# Patient Record
Sex: Female | Born: 1935 | Race: White | Hispanic: No | Smoking: Former smoker
Health system: Southern US, Community
[De-identification: ages and names within clinical notes are randomized; demographics above are authoritative.]

## PROBLEM LIST (undated history)

## (undated) DIAGNOSIS — I82409 Acute embolism and thrombosis of unspecified deep veins of unspecified lower extremity: Secondary | ICD-10-CM

## (undated) DIAGNOSIS — I2699 Other pulmonary embolism without acute cor pulmonale: Secondary | ICD-10-CM

## (undated) DIAGNOSIS — I10 Essential (primary) hypertension: Secondary | ICD-10-CM

## (undated) DIAGNOSIS — K219 Gastro-esophageal reflux disease without esophagitis: Secondary | ICD-10-CM

## (undated) DIAGNOSIS — J449 Chronic obstructive pulmonary disease, unspecified: Secondary | ICD-10-CM

## (undated) DIAGNOSIS — E785 Hyperlipidemia, unspecified: Secondary | ICD-10-CM

## (undated) DIAGNOSIS — F0390 Unspecified dementia without behavioral disturbance: Secondary | ICD-10-CM

## (undated) DIAGNOSIS — E119 Type 2 diabetes mellitus without complications: Secondary | ICD-10-CM

## (undated) HISTORY — DX: Unspecified dementia without behavioral disturbance: F03.90

## (undated) HISTORY — PX: CHOLECYSTECTOMY: SHX55

## (undated) HISTORY — PX: ABDOMINAL HYSTERECTOMY: SHX81

---

## 2004-10-22 ENCOUNTER — Ambulatory Visit: Payer: Self-pay | Admitting: Family Medicine

## 2004-10-27 ENCOUNTER — Ambulatory Visit: Payer: Self-pay | Admitting: Family Medicine

## 2004-12-24 ENCOUNTER — Ambulatory Visit: Payer: Self-pay

## 2005-08-05 ENCOUNTER — Ambulatory Visit: Payer: Self-pay | Admitting: Family Medicine

## 2006-06-05 ENCOUNTER — Ambulatory Visit: Payer: Self-pay | Admitting: Family Medicine

## 2007-06-05 ENCOUNTER — Ambulatory Visit (HOSPITAL_COMMUNITY): Admission: RE | Admit: 2007-06-05 | Discharge: 2007-06-05 | Payer: Self-pay | Admitting: Ophthalmology

## 2007-06-19 ENCOUNTER — Ambulatory Visit (HOSPITAL_COMMUNITY): Admission: RE | Admit: 2007-06-19 | Discharge: 2007-06-19 | Payer: Self-pay | Admitting: Ophthalmology

## 2011-06-02 LAB — BASIC METABOLIC PANEL
BUN: 12
Chloride: 102
Potassium: 4.2

## 2011-06-02 LAB — HEMOGLOBIN AND HEMATOCRIT, BLOOD: Hemoglobin: 12.2

## 2014-09-03 DIAGNOSIS — I10 Essential (primary) hypertension: Secondary | ICD-10-CM | POA: Diagnosis not present

## 2014-09-03 DIAGNOSIS — E119 Type 2 diabetes mellitus without complications: Secondary | ICD-10-CM | POA: Diagnosis not present

## 2014-09-03 DIAGNOSIS — E780 Pure hypercholesterolemia: Secondary | ICD-10-CM | POA: Diagnosis not present

## 2014-09-03 DIAGNOSIS — Z7901 Long term (current) use of anticoagulants: Secondary | ICD-10-CM | POA: Diagnosis not present

## 2014-09-03 DIAGNOSIS — K219 Gastro-esophageal reflux disease without esophagitis: Secondary | ICD-10-CM | POA: Diagnosis not present

## 2014-10-09 DIAGNOSIS — Z7901 Long term (current) use of anticoagulants: Secondary | ICD-10-CM | POA: Diagnosis not present

## 2014-11-06 DIAGNOSIS — Z7901 Long term (current) use of anticoagulants: Secondary | ICD-10-CM | POA: Diagnosis not present

## 2014-12-04 DIAGNOSIS — I10 Essential (primary) hypertension: Secondary | ICD-10-CM | POA: Diagnosis not present

## 2014-12-04 DIAGNOSIS — E119 Type 2 diabetes mellitus without complications: Secondary | ICD-10-CM | POA: Diagnosis not present

## 2014-12-04 DIAGNOSIS — E780 Pure hypercholesterolemia: Secondary | ICD-10-CM | POA: Diagnosis not present

## 2014-12-04 DIAGNOSIS — K219 Gastro-esophageal reflux disease without esophagitis: Secondary | ICD-10-CM | POA: Diagnosis not present

## 2014-12-10 DIAGNOSIS — Z7901 Long term (current) use of anticoagulants: Secondary | ICD-10-CM | POA: Diagnosis not present

## 2014-12-10 DIAGNOSIS — E780 Pure hypercholesterolemia: Secondary | ICD-10-CM | POA: Diagnosis not present

## 2014-12-10 DIAGNOSIS — E119 Type 2 diabetes mellitus without complications: Secondary | ICD-10-CM | POA: Diagnosis not present

## 2014-12-25 DIAGNOSIS — Z7901 Long term (current) use of anticoagulants: Secondary | ICD-10-CM | POA: Diagnosis not present

## 2015-01-29 DIAGNOSIS — Z7901 Long term (current) use of anticoagulants: Secondary | ICD-10-CM | POA: Diagnosis not present

## 2015-02-26 DIAGNOSIS — Z7901 Long term (current) use of anticoagulants: Secondary | ICD-10-CM | POA: Diagnosis not present

## 2015-02-26 DIAGNOSIS — E119 Type 2 diabetes mellitus without complications: Secondary | ICD-10-CM | POA: Diagnosis not present

## 2015-02-26 DIAGNOSIS — E784 Other hyperlipidemia: Secondary | ICD-10-CM | POA: Diagnosis not present

## 2015-03-04 DIAGNOSIS — K219 Gastro-esophageal reflux disease without esophagitis: Secondary | ICD-10-CM | POA: Diagnosis not present

## 2015-03-04 DIAGNOSIS — E780 Pure hypercholesterolemia: Secondary | ICD-10-CM | POA: Diagnosis not present

## 2015-03-04 DIAGNOSIS — E119 Type 2 diabetes mellitus without complications: Secondary | ICD-10-CM | POA: Diagnosis not present

## 2015-03-04 DIAGNOSIS — I10 Essential (primary) hypertension: Secondary | ICD-10-CM | POA: Diagnosis not present

## 2015-04-02 DIAGNOSIS — Z7901 Long term (current) use of anticoagulants: Secondary | ICD-10-CM | POA: Diagnosis not present

## 2015-05-07 DIAGNOSIS — Z7901 Long term (current) use of anticoagulants: Secondary | ICD-10-CM | POA: Diagnosis not present

## 2015-06-03 DIAGNOSIS — Z7901 Long term (current) use of anticoagulants: Secondary | ICD-10-CM | POA: Diagnosis not present

## 2015-06-03 DIAGNOSIS — E7800 Pure hypercholesterolemia, unspecified: Secondary | ICD-10-CM | POA: Diagnosis not present

## 2015-06-03 DIAGNOSIS — E119 Type 2 diabetes mellitus without complications: Secondary | ICD-10-CM | POA: Diagnosis not present

## 2015-06-10 DIAGNOSIS — E7800 Pure hypercholesterolemia, unspecified: Secondary | ICD-10-CM | POA: Diagnosis not present

## 2015-06-10 DIAGNOSIS — K219 Gastro-esophageal reflux disease without esophagitis: Secondary | ICD-10-CM | POA: Diagnosis not present

## 2015-06-10 DIAGNOSIS — I10 Essential (primary) hypertension: Secondary | ICD-10-CM | POA: Diagnosis not present

## 2015-06-10 DIAGNOSIS — E1165 Type 2 diabetes mellitus with hyperglycemia: Secondary | ICD-10-CM | POA: Diagnosis not present

## 2015-06-10 DIAGNOSIS — Z7901 Long term (current) use of anticoagulants: Secondary | ICD-10-CM | POA: Diagnosis not present

## 2015-07-15 DIAGNOSIS — Z7901 Long term (current) use of anticoagulants: Secondary | ICD-10-CM | POA: Diagnosis not present

## 2015-07-28 DIAGNOSIS — Z7901 Long term (current) use of anticoagulants: Secondary | ICD-10-CM | POA: Diagnosis not present

## 2015-08-07 DIAGNOSIS — J0110 Acute frontal sinusitis, unspecified: Secondary | ICD-10-CM | POA: Diagnosis not present

## 2015-08-07 DIAGNOSIS — J449 Chronic obstructive pulmonary disease, unspecified: Secondary | ICD-10-CM | POA: Diagnosis not present

## 2015-08-25 DIAGNOSIS — H903 Sensorineural hearing loss, bilateral: Secondary | ICD-10-CM | POA: Diagnosis not present

## 2015-08-25 DIAGNOSIS — H9319 Tinnitus, unspecified ear: Secondary | ICD-10-CM | POA: Diagnosis not present

## 2015-09-03 DIAGNOSIS — E7800 Pure hypercholesterolemia, unspecified: Secondary | ICD-10-CM | POA: Diagnosis not present

## 2015-09-03 DIAGNOSIS — E1165 Type 2 diabetes mellitus with hyperglycemia: Secondary | ICD-10-CM | POA: Diagnosis not present

## 2015-09-03 DIAGNOSIS — Z7901 Long term (current) use of anticoagulants: Secondary | ICD-10-CM | POA: Diagnosis not present

## 2015-09-10 DIAGNOSIS — J449 Chronic obstructive pulmonary disease, unspecified: Secondary | ICD-10-CM | POA: Diagnosis not present

## 2015-09-10 DIAGNOSIS — E1165 Type 2 diabetes mellitus with hyperglycemia: Secondary | ICD-10-CM | POA: Diagnosis not present

## 2015-09-10 DIAGNOSIS — K219 Gastro-esophageal reflux disease without esophagitis: Secondary | ICD-10-CM | POA: Diagnosis not present

## 2015-09-10 DIAGNOSIS — E7800 Pure hypercholesterolemia, unspecified: Secondary | ICD-10-CM | POA: Diagnosis not present

## 2015-09-10 DIAGNOSIS — I10 Essential (primary) hypertension: Secondary | ICD-10-CM | POA: Diagnosis not present

## 2015-09-24 DIAGNOSIS — Z7901 Long term (current) use of anticoagulants: Secondary | ICD-10-CM | POA: Diagnosis not present

## 2015-10-23 DIAGNOSIS — Z7901 Long term (current) use of anticoagulants: Secondary | ICD-10-CM | POA: Diagnosis not present

## 2015-11-12 DIAGNOSIS — Z7901 Long term (current) use of anticoagulants: Secondary | ICD-10-CM | POA: Diagnosis not present

## 2015-12-02 DIAGNOSIS — E7800 Pure hypercholesterolemia, unspecified: Secondary | ICD-10-CM | POA: Diagnosis not present

## 2015-12-02 DIAGNOSIS — Z7901 Long term (current) use of anticoagulants: Secondary | ICD-10-CM | POA: Diagnosis not present

## 2015-12-02 DIAGNOSIS — E1165 Type 2 diabetes mellitus with hyperglycemia: Secondary | ICD-10-CM | POA: Diagnosis not present

## 2015-12-09 DIAGNOSIS — E1165 Type 2 diabetes mellitus with hyperglycemia: Secondary | ICD-10-CM | POA: Diagnosis not present

## 2015-12-09 DIAGNOSIS — E7800 Pure hypercholesterolemia, unspecified: Secondary | ICD-10-CM | POA: Diagnosis not present

## 2015-12-09 DIAGNOSIS — Z7901 Long term (current) use of anticoagulants: Secondary | ICD-10-CM | POA: Diagnosis not present

## 2015-12-09 DIAGNOSIS — F5101 Primary insomnia: Secondary | ICD-10-CM | POA: Diagnosis not present

## 2015-12-09 DIAGNOSIS — K219 Gastro-esophageal reflux disease without esophagitis: Secondary | ICD-10-CM | POA: Diagnosis not present

## 2015-12-09 DIAGNOSIS — J449 Chronic obstructive pulmonary disease, unspecified: Secondary | ICD-10-CM | POA: Diagnosis not present

## 2015-12-09 DIAGNOSIS — I10 Essential (primary) hypertension: Secondary | ICD-10-CM | POA: Diagnosis not present

## 2015-12-23 DIAGNOSIS — Z7901 Long term (current) use of anticoagulants: Secondary | ICD-10-CM | POA: Diagnosis not present

## 2016-01-04 ENCOUNTER — Emergency Department: Payer: Commercial Managed Care - HMO

## 2016-01-04 ENCOUNTER — Observation Stay
Admission: EM | Admit: 2016-01-04 | Discharge: 2016-01-06 | Disposition: A | Discharge status: Skilled Nursing Facility | Payer: Commercial Managed Care - HMO | Attending: Internal Medicine | Admitting: Internal Medicine

## 2016-01-04 ENCOUNTER — Encounter: Payer: Self-pay | Admitting: *Deleted

## 2016-01-04 DIAGNOSIS — Z86718 Personal history of other venous thrombosis and embolism: Secondary | ICD-10-CM | POA: Insufficient documentation

## 2016-01-04 DIAGNOSIS — R531 Weakness: Secondary | ICD-10-CM | POA: Insufficient documentation

## 2016-01-04 DIAGNOSIS — S42201A Unspecified fracture of upper end of right humerus, initial encounter for closed fracture: Secondary | ICD-10-CM | POA: Diagnosis not present

## 2016-01-04 DIAGNOSIS — R778 Other specified abnormalities of plasma proteins: Secondary | ICD-10-CM | POA: Diagnosis not present

## 2016-01-04 DIAGNOSIS — Z8249 Family history of ischemic heart disease and other diseases of the circulatory system: Secondary | ICD-10-CM | POA: Diagnosis not present

## 2016-01-04 DIAGNOSIS — E1165 Type 2 diabetes mellitus with hyperglycemia: Secondary | ICD-10-CM | POA: Insufficient documentation

## 2016-01-04 DIAGNOSIS — G9341 Metabolic encephalopathy: Secondary | ICD-10-CM | POA: Diagnosis not present

## 2016-01-04 DIAGNOSIS — Z7901 Long term (current) use of anticoagulants: Secondary | ICD-10-CM | POA: Insufficient documentation

## 2016-01-04 DIAGNOSIS — M545 Low back pain, unspecified: Secondary | ICD-10-CM

## 2016-01-04 DIAGNOSIS — S4991XA Unspecified injury of right shoulder and upper arm, initial encounter: Secondary | ICD-10-CM | POA: Diagnosis not present

## 2016-01-04 DIAGNOSIS — Z7984 Long term (current) use of oral hypoglycemic drugs: Secondary | ICD-10-CM | POA: Diagnosis not present

## 2016-01-04 DIAGNOSIS — R262 Difficulty in walking, not elsewhere classified: Secondary | ICD-10-CM | POA: Insufficient documentation

## 2016-01-04 DIAGNOSIS — Z880 Allergy status to penicillin: Secondary | ICD-10-CM | POA: Insufficient documentation

## 2016-01-04 DIAGNOSIS — Z86711 Personal history of pulmonary embolism: Secondary | ICD-10-CM | POA: Diagnosis not present

## 2016-01-04 DIAGNOSIS — E785 Hyperlipidemia, unspecified: Secondary | ICD-10-CM | POA: Diagnosis not present

## 2016-01-04 DIAGNOSIS — M8580 Other specified disorders of bone density and structure, unspecified site: Secondary | ICD-10-CM | POA: Diagnosis not present

## 2016-01-04 DIAGNOSIS — I700 Atherosclerosis of aorta: Secondary | ICD-10-CM | POA: Insufficient documentation

## 2016-01-04 DIAGNOSIS — I10 Essential (primary) hypertension: Secondary | ICD-10-CM | POA: Insufficient documentation

## 2016-01-04 DIAGNOSIS — R300 Dysuria: Secondary | ICD-10-CM | POA: Insufficient documentation

## 2016-01-04 DIAGNOSIS — N390 Urinary tract infection, site not specified: Principal | ICD-10-CM | POA: Insufficient documentation

## 2016-01-04 DIAGNOSIS — N10 Acute tubulo-interstitial nephritis: Secondary | ICD-10-CM | POA: Insufficient documentation

## 2016-01-04 DIAGNOSIS — F0390 Unspecified dementia without behavioral disturbance: Secondary | ICD-10-CM | POA: Insufficient documentation

## 2016-01-04 DIAGNOSIS — Z809 Family history of malignant neoplasm, unspecified: Secondary | ICD-10-CM | POA: Insufficient documentation

## 2016-01-04 DIAGNOSIS — K219 Gastro-esophageal reflux disease without esophagitis: Secondary | ICD-10-CM | POA: Diagnosis not present

## 2016-01-04 DIAGNOSIS — D72829 Elevated white blood cell count, unspecified: Secondary | ICD-10-CM

## 2016-01-04 DIAGNOSIS — Z79899 Other long term (current) drug therapy: Secondary | ICD-10-CM | POA: Diagnosis not present

## 2016-01-04 DIAGNOSIS — M5135 Other intervertebral disc degeneration, thoracolumbar region: Secondary | ICD-10-CM | POA: Diagnosis not present

## 2016-01-04 DIAGNOSIS — S0990XA Unspecified injury of head, initial encounter: Secondary | ICD-10-CM | POA: Diagnosis not present

## 2016-01-04 DIAGNOSIS — M50321 Other cervical disc degeneration at C4-C5 level: Secondary | ICD-10-CM | POA: Insufficient documentation

## 2016-01-04 DIAGNOSIS — B9620 Unspecified Escherichia coli [E. coli] as the cause of diseases classified elsewhere: Secondary | ICD-10-CM | POA: Insufficient documentation

## 2016-01-04 DIAGNOSIS — R6889 Other general symptoms and signs: Secondary | ICD-10-CM | POA: Diagnosis not present

## 2016-01-04 DIAGNOSIS — J449 Chronic obstructive pulmonary disease, unspecified: Secondary | ICD-10-CM | POA: Insufficient documentation

## 2016-01-04 DIAGNOSIS — Z820 Family history of epilepsy and other diseases of the nervous system: Secondary | ICD-10-CM | POA: Diagnosis not present

## 2016-01-04 DIAGNOSIS — Z885 Allergy status to narcotic agent status: Secondary | ICD-10-CM | POA: Insufficient documentation

## 2016-01-04 DIAGNOSIS — S42309A Unspecified fracture of shaft of humerus, unspecified arm, initial encounter for closed fracture: Secondary | ICD-10-CM | POA: Diagnosis present

## 2016-01-04 DIAGNOSIS — M50322 Other cervical disc degeneration at C5-C6 level: Secondary | ICD-10-CM | POA: Insufficient documentation

## 2016-01-04 DIAGNOSIS — E119 Type 2 diabetes mellitus without complications: Secondary | ICD-10-CM | POA: Diagnosis not present

## 2016-01-04 DIAGNOSIS — G4733 Obstructive sleep apnea (adult) (pediatric): Secondary | ICD-10-CM | POA: Insufficient documentation

## 2016-01-04 DIAGNOSIS — S3992XA Unspecified injury of lower back, initial encounter: Secondary | ICD-10-CM | POA: Diagnosis not present

## 2016-01-04 DIAGNOSIS — B9689 Other specified bacterial agents as the cause of diseases classified elsewhere: Secondary | ICD-10-CM | POA: Diagnosis not present

## 2016-01-04 DIAGNOSIS — W19XXXA Unspecified fall, initial encounter: Secondary | ICD-10-CM | POA: Diagnosis not present

## 2016-01-04 DIAGNOSIS — I083 Combined rheumatic disorders of mitral, aortic and tricuspid valves: Secondary | ICD-10-CM | POA: Diagnosis not present

## 2016-01-04 DIAGNOSIS — R4182 Altered mental status, unspecified: Secondary | ICD-10-CM | POA: Diagnosis not present

## 2016-01-04 DIAGNOSIS — M25462 Effusion, left knee: Secondary | ICD-10-CM | POA: Diagnosis not present

## 2016-01-04 DIAGNOSIS — S199XXA Unspecified injury of neck, initial encounter: Secondary | ICD-10-CM | POA: Diagnosis not present

## 2016-01-04 DIAGNOSIS — R739 Hyperglycemia, unspecified: Secondary | ICD-10-CM

## 2016-01-04 DIAGNOSIS — M5136 Other intervertebral disc degeneration, lumbar region: Secondary | ICD-10-CM

## 2016-01-04 DIAGNOSIS — G9340 Encephalopathy, unspecified: Secondary | ICD-10-CM | POA: Diagnosis not present

## 2016-01-04 DIAGNOSIS — Z803 Family history of malignant neoplasm of breast: Secondary | ICD-10-CM | POA: Insufficient documentation

## 2016-01-04 DIAGNOSIS — S3993XA Unspecified injury of pelvis, initial encounter: Secondary | ICD-10-CM | POA: Diagnosis not present

## 2016-01-04 DIAGNOSIS — R7989 Other specified abnormal findings of blood chemistry: Secondary | ICD-10-CM

## 2016-01-04 DIAGNOSIS — M50323 Other cervical disc degeneration at C6-C7 level: Secondary | ICD-10-CM | POA: Insufficient documentation

## 2016-01-04 DIAGNOSIS — M25511 Pain in right shoulder: Secondary | ICD-10-CM | POA: Diagnosis not present

## 2016-01-04 DIAGNOSIS — Z823 Family history of stroke: Secondary | ICD-10-CM | POA: Diagnosis not present

## 2016-01-04 DIAGNOSIS — S299XXA Unspecified injury of thorax, initial encounter: Secondary | ICD-10-CM | POA: Diagnosis not present

## 2016-01-04 DIAGNOSIS — S42293A Other displaced fracture of upper end of unspecified humerus, initial encounter for closed fracture: Secondary | ICD-10-CM

## 2016-01-04 HISTORY — DX: Essential (primary) hypertension: I10

## 2016-01-04 HISTORY — DX: Type 2 diabetes mellitus without complications: E11.9

## 2016-01-04 HISTORY — DX: Hyperlipidemia, unspecified: E78.5

## 2016-01-04 HISTORY — DX: Chronic obstructive pulmonary disease, unspecified: J44.9

## 2016-01-04 HISTORY — DX: Other pulmonary embolism without acute cor pulmonale: I26.99

## 2016-01-04 HISTORY — DX: Acute embolism and thrombosis of unspecified deep veins of unspecified lower extremity: I82.409

## 2016-01-04 HISTORY — DX: Gastro-esophageal reflux disease without esophagitis: K21.9

## 2016-01-04 LAB — CBC
HCT: 39.3 % (ref 35.0–47.0)
HEMOGLOBIN: 13.3 g/dL (ref 12.0–16.0)
MCH: 29.2 pg (ref 26.0–34.0)
MCHC: 33.8 g/dL (ref 32.0–36.0)
MCV: 86.6 fL (ref 80.0–100.0)
Platelets: 181 10*3/uL (ref 150–440)
RBC: 4.54 MIL/uL (ref 3.80–5.20)
RDW: 14.1 % (ref 11.5–14.5)
WBC: 17.2 10*3/uL — ABNORMAL HIGH (ref 3.6–11.0)

## 2016-01-04 LAB — TROPONIN I: TROPONIN I: 0.04 ng/mL — AB (ref <0.031)

## 2016-01-04 LAB — URINALYSIS COMPLETE WITH MICROSCOPIC (ARMC ONLY)
Bilirubin Urine: NEGATIVE
Glucose, UA: >500 mg/dL — AB
Nitrite: POSITIVE — AB
PH: 5 (ref 5.0–8.0)
PROTEIN: 100 mg/dL — AB
Specific Gravity, Urine: 1.029 (ref 1.005–1.030)

## 2016-01-04 LAB — BASIC METABOLIC PANEL
Anion gap: 10 (ref 5–15)
BUN: 12 mg/dL (ref 6–20)
CHLORIDE: 97 mmol/L — AB (ref 101–111)
CO2: 29 mmol/L (ref 22–32)
CREATININE: 0.72 mg/dL (ref 0.44–1.00)
Calcium: 9 mg/dL (ref 8.9–10.3)
GFR calc Af Amer: >60 mL/min (ref >60)
GFR calc non Af Amer: >60 mL/min (ref >60)
GLUCOSE: 253 mg/dL — AB (ref 65–99)
POTASSIUM: 3.7 mmol/L (ref 3.5–5.1)
SODIUM: 136 mmol/L (ref 135–145)

## 2016-01-04 LAB — CK: CK TOTAL: 434 U/L — AB (ref 38–234)

## 2016-01-04 LAB — PROTIME-INR
INR: 1.65
PROTHROMBIN TIME: 19.5 s — AB (ref 11.4–15.0)

## 2016-01-04 MED ORDER — SODIUM CHLORIDE 0.9 % IV BOLUS (SEPSIS)
1000.0000 mL | Freq: Once | INTRAVENOUS | Status: AC
Start: 2016-01-04 — End: 2016-01-04
  Administered 2016-01-04: 1000 mL via INTRAVENOUS

## 2016-01-04 MED ORDER — DEXTROSE 5 % IV SOLN
1.0000 g | Freq: Once | INTRAVENOUS | Status: AC
  Administered 2016-01-04: 1 g via INTRAVENOUS
  Filled 2016-01-04: qty 10

## 2016-01-04 NOTE — ED Notes (Signed)
Pt was found by family this afternoon after falling last night.   Pt was found on floor soaked in urine.  Pt has dementia.  Pt has mid back pain. Pt alert.

## 2016-01-04 NOTE — ED Notes (Signed)
Pt alert.  Iv fluids infusing.  nsr on monitor.  Family at bedside.  Pt waiting on admission.

## 2016-01-04 NOTE — ED Provider Notes (Signed)
[facility] Emergency Department Provider Note  ____________________________________________  Time seen: Approximately 7:29 PM  I have reviewed the triage vital signs and the nursing notes.   HISTORY  Chief Complaint Fall    HPI JOURNIE HOWSON is a 82 y.o. female who lives at home, with a history of dementia, brought by family after a fall. The patient reports that she fell in the middle the night, "I must of tripped on something." She denies any loss of consciousness, did not hit her head. She was unable to get back up after her fall due to pain in her lower back. She denies any numbness tingling or weakness, headache, nausea or vomiting, chest pain, palpitations or shortness of breath. She has no visual changes area daughter does report that she is confused, and has been seeing things since her arrival here. Patient also reports several weeks of progressively worsening dysuria with malodorous urine.   No past medical history on file.  There are no active problems to display for this patient.   No past surgical history on file.  Current Outpatient Rx  Name  Route  Sig  Dispense  Refill  . acetaminophen (TYLENOL) 500 MG tablet   Oral   Take 500 mg by mouth every 6 (six) hours as needed for mild pain or headache.         Marland Kitchen glipiZIDE (GLUCOTROL XL) 10 MG 24 hr tablet   Oral   Take 10 mg by mouth 2 (two) times daily.         Marland Kitchen lisinopril (PRINIVIL,ZESTRIL) 40 MG tablet   Oral   Take 40 mg by mouth daily.         . metFORMIN (GLUCOPHAGE-XR) 500 MG 24 hr tablet   Oral   Take 500 mg by mouth 2 (two) times daily.         . metoprolol succinate (TOPROL-XL) 25 MG 24 hr tablet   Oral   Take 25 mg by mouth daily.         . pantoprazole (PROTONIX) 40 MG tablet   Oral   Take 40 mg by mouth daily.         . pravastatin (PRAVACHOL) 40 MG tablet   Oral   Take 40 mg by mouth at bedtime.         . traZODone (DESYREL) 50 MG tablet   Oral    Take 50 mg by mouth at bedtime.         . vitamin B-12 (CYANOCOBALAMIN) 1000 MCG tablet   Oral   Take 1,000 mcg by mouth daily.         Marland Kitchen warfarin (COUMADIN) 2.5 MG tablet   Oral   Take 2.5 mg by mouth at bedtime. Pt takes this dose on Monday and Thursday.         . warfarin (COUMADIN) 5 MG tablet   Oral   Take 5 mg by mouth at bedtime. Pt takes this dose on Saturday, Tuesday, Wednesday, Friday, and Saturday.           Allergies Penicillins and Demerol  No family history on file.  Social History Social History  Substance Use Topics  . Smoking status: Never Smoker   . Smokeless tobacco: Not on file  . Alcohol Use: No    Review of Systems Constitutional: No fever/chills.Positive fall. Negative syncope. Negative loss of consciousness. Eyes: No visual changes. No blurred or double vision. ENT: No sore throat. No congestion or rhinorrhea. Cardiovascular: Denies  chest pain. Denies palpitations. Respiratory: Denies shortness of breath.  No cough. Gastrointestinal: No abdominal pain.  No nausea, no vomiting.  No diarrhea.  No constipation. Genitourinary: Positive for dysuria. Musculoskeletal:  Positive low back pain, right upper extremity pain. Skin: Negative for rash. Neurological: Negative for headaches. No focal numbness, tingling or weakness.   10-point ROS otherwise negative.  ____________________________________________   PHYSICAL EXAM:  VITAL SIGNS: ED Triage Vitals  Enc Vitals Group     BP --      Pulse --      Resp --      Temp --      Temp src --      SpO2 --      Weight --      Height --      Head Cir --      Peak Flow --      Pain Score --      Pain Loc --      Pain Edu? --      Excl. in GC? --     Constitutional: Asian is alert and answering questions appropriate. She is elderly appearing but nontoxic. He smells of malodorous urine.  Eyes: Conjunctivae are normal.  EOMI. No scleral icterus. Head: Atraumatic. No raccoon eyes or Battle  sign.  Nose: No congestion/rhinnorhea. No swelling of the nose. No septal hematoma. Mouth/Throat: Mucous membranes are moist. No malocclusion or dental injury.  Neck: No stridor.  Supple.  No JVD. No midline C-spine tenderness to palpation, step-offs or deformities. No meningismus. Cardiovascular: Normal rate, regular rhythm. No murmurs, rubs or gallops.  Respiratory: Normal respiratory effort.  No accessory muscle use or retractions. Lungs CTAB.  No wheezes, rales or ronchi. Gastrointestinal: Obese. Soft, nontender and mildly stented.  No guarding or rebound.  No peritoneal signs. Musculoskeletal: No LE edema. No ttp in the calves or palpable cords.  Negative Homan's sign. Positive ecchymosis with mild effusion over the left knee. Ecchymosis over the right posterior shoulder. Tenderness to palpation over the mid right humeral shaft. Normal radial pulses bilaterally. Normal DP and PT is bilaterally. Full range of motion of all the joints. Midline lower T-spine tenderness to palpation without step-offs or deformities. Midline mild lower lumbar spine tenderness without step-offs or deformities. Neurologic:  Alert.Marland Kitchen  Speech is clear.  Face and smile are symmetric.  EOMI.  Moves all extremities well. Skin:  Skin is warm, dry and intact. No rash noted. Psychiatric: Mood and affect are normal. Speech and behavior are normal.  Normal judgement.  ____________________________________________   LABS (all labs ordered are listed, but only abnormal results are displayed)  Labs Reviewed  CBC - Abnormal; Notable for the following:    WBC 17.2 (*)    All other components within normal limits  BASIC METABOLIC PANEL - Abnormal; Notable for the following:    Chloride 97 (*)    Glucose, Bld 253 (*)    All other components within normal limits  URINALYSIS COMPLETEWITH MICROSCOPIC ([facility] ONLY) - Abnormal; Notable for the following:    Color, Urine YELLOW (*)    APPearance HAZY (*)    Glucose, UA >500 (*)     Ketones, ur 1+ (*)    Hgb urine dipstick 2+ (*)    Protein, ur 100 (*)    Nitrite POSITIVE (*)    Leukocytes, UA TRACE (*)    Bacteria, UA FEW (*)    Squamous Epithelial / LPF 0-5 (*)    All other components within  normal limits  CK - Abnormal; Notable for the following:    Total CK 434 (*)    All other components within normal limits  TROPONIN I - Abnormal; Notable for the following:    Troponin I 0.04 (*)    All other components within normal limits  PROTIME-INR - Abnormal; Notable for the following:    Prothrombin Time 19.5 (*)    All other components within normal limits  URINE CULTURE   ____________________________________________  EKG  ED ECG REPORT I, Rockne MenghiniNorman, Anne-Caroline, the attending physician, personally viewed and interpreted this ECG.   Date: 01/04/2016  EKG Time: 1829  Rate: 91  Rhythm: normal sinus rhythm  Axis: normal  Intervals:Short PR interval  ST&T Change: No ST elevation. Positive PVC.  ____________________________________________  RADIOLOGY  Dg Chest 1 View  01/04/2016  CLINICAL DATA:  Injury from fall. Patient fell last night and was found on floor today by family. EXAM: CHEST 1 VIEW COMPARISON:  None. FINDINGS: Cardiac silhouette. No pulmonary contusion or pleural fluid. No pneumothorax. No evidence fracture. IMPRESSION: No evidence of thoracic trauma. Electronically Signed   By: Genevive BiStewart  Edmunds M.D.   On: 01/04/2016 21:13   Dg Thoracic Spine 2 View  01/04/2016  CLINICAL DATA:  Fall EXAM: THORACIC SPINE 2 VIEWS COMPARISON:  None. FINDINGS: Normal alignment of the thoracic vertebral bodies. No loss of vertebral body height or disc height. No subluxation. Normal paraspinal lines. IMPRESSION: No evidence of thoracic spine fracture. Electronically Signed   By: Genevive BiStewart  Edmunds M.D.   On: 01/04/2016 21:17   Dg Lumbar Spine Complete  01/04/2016  CLINICAL DATA:  Unwitnessed fall. EXAM: LUMBAR SPINE - COMPLETE 4+ VIEW COMPARISON:  None. FINDINGS: Diffuse  osteopenia is noted. Atherosclerosis of abdominal aorta is noted. No fracture or spondylolisthesis is noted. Mild degenerative disc disease is noted at T12-L1. Posterior facet joints are unremarkable. IMPRESSION: No acute abnormality seen in the lumbar spine. Atherosclerosis of abdominal aorta is noted. Electronically Signed   By: Lupita RaiderJames  Green Jr, M.D.   On: 01/04/2016 21:11   Dg Pelvis 1-2 Views  01/04/2016  CLINICAL DATA:  Injury from fall. Patient fell last night and was found on floor today by family. EXAM: PELVIS - 1-2 VIEW COMPARISON:  None FINDINGS: Hips are located.  No pelvic fracture or sacral fracture. IMPRESSION: No evidence of pelvic fracture or hip fracture on single view Electronically Signed   By: Genevive BiStewart  Edmunds M.D.   On: 01/04/2016 21:15   Dg Shoulder Right  01/04/2016  CLINICAL DATA:  Injury from fall EXAM: RIGHT SHOULDER - 2+ VIEW COMPARISON:  None. FINDINGS: Glenohumeral joint is intact. No evidence of scapular fracture or humeral fracture. The acromioclavicular joint is intact. Large loose body inferior to the joint. Degenerate changes of the humeral head. IMPRESSION: 1. No evidence acute fracture or dislocation. 2. Chronic degenerative changes. Electronically Signed   By: Genevive BiStewart  Edmunds M.D.   On: 01/04/2016 21:16   Ct Head Wo Contrast  01/04/2016  CLINICAL DATA:  Unwitnessed fall. EXAM: CT HEAD WITHOUT CONTRAST CT CERVICAL SPINE WITHOUT CONTRAST TECHNIQUE: Multidetector CT imaging of the head and cervical spine was performed following the standard protocol without intravenous contrast. Multiplanar CT image reconstructions of the cervical spine were also generated. COMPARISON:  None. FINDINGS: CT HEAD FINDINGS Bony calvarium appears intact. Mild diffuse cortical atrophy is noted. Mild chronic ischemic white matter disease is noted. No mass effect or midline shift is noted. Ventricular size is within normal limits. There is no evidence  of mass lesion, hemorrhage or acute  infarction. CT CERVICAL SPINE FINDINGS No fracture or spondylolisthesis is noted. Moderate multilevel degenerative disc disease is noted extending from C3-4 to C6-7. Visualized lung apices appear normal. IMPRESSION: Mild diffuse cortical atrophy. Mild chronic ischemic white matter disease. No acute intracranial abnormality seen. Moderate multilevel degenerative disc disease. No acute abnormality seen in the cervical spine. Electronically Signed   By: Lupita Raider, M.D.   On: 01/04/2016 20:52   Ct Cervical Spine Wo Contrast  01/04/2016  CLINICAL DATA:  Unwitnessed fall. EXAM: CT HEAD WITHOUT CONTRAST CT CERVICAL SPINE WITHOUT CONTRAST TECHNIQUE: Multidetector CT imaging of the head and cervical spine was performed following the standard protocol without intravenous contrast. Multiplanar CT image reconstructions of the cervical spine were also generated. COMPARISON:  None. FINDINGS: CT HEAD FINDINGS Bony calvarium appears intact. Mild diffuse cortical atrophy is noted. Mild chronic ischemic white matter disease is noted. No mass effect or midline shift is noted. Ventricular size is within normal limits. There is no evidence of mass lesion, hemorrhage or acute infarction. CT CERVICAL SPINE FINDINGS No fracture or spondylolisthesis is noted. Moderate multilevel degenerative disc disease is noted extending from C3-4 to C6-7. Visualized lung apices appear normal. IMPRESSION: Mild diffuse cortical atrophy. Mild chronic ischemic white matter disease. No acute intracranial abnormality seen. Moderate multilevel degenerative disc disease. No acute abnormality seen in the cervical spine. Electronically Signed   By: Lupita Raider, M.D.   On: 01/04/2016 20:52   Dg Knee Complete 4 Views Left  01/04/2016  CLINICAL DATA:  Injury from fall. Patient fell last night and was found on floor today by family. EXAM: LEFT KNEE - COMPLETE 4+ VIEW COMPARISON:  None. FINDINGS: Joint space narrowing and osteophytosis of the lateral  compartment. No acute fracture or dislocation. Small suprapatellar joint effusion. IMPRESSION: No fracture.  Small effusion. Osteoarthritis of the lateral compartment Electronically Signed   By: Genevive Bi M.D.   On: 01/04/2016 21:14   Dg Humerus Right  01/04/2016  CLINICAL DATA:  Unwitnessed fall. EXAM: RIGHT HUMERUS - 2+ VIEW COMPARISON:  None. FINDINGS: Irregularity is seen involving right humeral head suggesting possible mildly displaced fracture. No other significant abnormality seen involving the remaining portion of the right humerus. IMPRESSION: Possible mildly displaced right humeral head fracture. CT scan may be performed for further evaluation. Electronically Signed   By: Lupita Raider, M.D.   On: 01/04/2016 21:15    ____________________________________________   PROCEDURES  Procedure(s) performed: None  Critical Care performed: No ____________________________________________   INITIAL IMPRESSION / ASSESSMENT AND PLAN / ED COURSE  Pertinent labs & imaging results that were available during my care of the patient were reviewed by me and considered in my medical decision making (see chart for details).  82 y.o. female who lives independently presenting status post a mechanical fall.  The patient does take Coumadin, and is demented, so a CT of her head to rule out any intracranial process. We'll also get a CT of the neck. She does have some nonfocal tenderness in the thoracic and lumbar spines with our order plain x-rays for that. I will get an x-ray of her left knee, her right humerus, and her right shoulder to rule out trauma. In the meantime, she does smell like she may have urinary tract infection and has been having some confusion, so she will need admission to the hospital.  ----------------------------------------- 9:38 PM on 01/04/2016 -----------------------------------------  Patient does have a urinary tract infection and  I have treated her with Rocephin. The  nurses aware that she has hives with penicillin, and we'll look for any cross-reactivity. The patient has an elevated CK but no bump in her creatinine, so we'll treat her with fluid management and continued monitoring.  In addition, the patient has a troponin of 0.04 which will need to be trended. At this time she has no chest pain nor any ischemic changes on her EKG.  Her plain films do not show any abnormalities except for a possible right humeral head fracture. I have put her in for a follow-up CT for further evaluation. The admitting physician will follow up the CT results.  At this time, we'll plan admission to the hospital.  ____________________________________________  FINAL CLINICAL IMPRESSION(S) / ED DIAGNOSES  Final diagnoses:  Altered mental status, unspecified altered mental status type  UTI (lower urinary tract infection)  Hyperglycemia  Humerus fracture      NEW MEDICATIONS STARTED DURING THIS VISIT:  New Prescriptions   No medications on file      Rockne Menghini, MD 01/04/16 2140

## 2016-01-04 NOTE — ED Notes (Signed)
Pt in xray

## 2016-01-04 NOTE — ED Notes (Signed)
Pt sleeping   Iv fluids infusing.  Family with pt.

## 2016-01-04 NOTE — ED Notes (Signed)
Pt brought in via ems from home.  Pt fell last night and was found in the floor today.  Pt has dementia.  Pt was soaked in urine on arrival.  Pt alert and talking.

## 2016-01-04 NOTE — H&P (Signed)
[facility] [place] [facility]   PATIENT NAME: Natalie CooterDorothy Rogers    MR#:  [id number]  DATE OF BIRTH:  April 07, 1924  DATE OF ADMISSION:  01/04/2016  PRIMARY CARE PHYSICIAN: Marina GoodellFELDPAUSCH, DALE E, MD   REQUESTING/REFERRING PHYSICIAN: Sharma CovertNorman, MD  CHIEF COMPLAINT:   Chief Complaint  Patient presents with  . Fall    HISTORY OF PRESENT ILLNESS:  Natalie CooterDorothy Rogers  is a 82 y.o. female who presents with Fall at home and confusion. Patient is unable to provide much clarity into her history today, history is mostly taken from collateral information from her daughter who is present at bedside with the patient. Patient's daughter states that the patient and told her about a week ago that she was having some dysuria, but then subsequently stated that this problem had self resolved. Last night the patient had a fall, denies any loss of consciousness or hitting her head. She was brought to the ED for evaluation when she was found later in the day. Here she is noted significantly to have a UTI. Hospitalists were called for admission.  PAST MEDICAL HISTORY:   Past Medical History  Diagnosis Date  . HTN (hypertension)   . HLD (hyperlipidemia)   . COPD (chronic obstructive pulmonary disease) (HCC)   . GERD (gastroesophageal reflux disease)   . Type 2 diabetes mellitus (HCC)   . Sleep apnea   . DVT (deep venous thrombosis) (HCC)   . PE (pulmonary embolism)     on chronic anticoagulation    PAST SURGICAL HISTORY:   Past Surgical History  Procedure Laterality Date  . Abdominal hysterectomy    . Cholecystectomy      SOCIAL HISTORY:   Social History  Substance Use Topics  . Smoking status: Never Smoker   . Smokeless tobacco: Not on file  . Alcohol Use: No    FAMILY HISTORY:   Family History  Problem Relation Age of Onset  . Heart disease    . Alzheimer's disease    . Stroke    . Breast cancer      DRUG ALLERGIES:   Allergies  Allergen Reactions  .  Penicillins Anaphylaxis, Rash and Other (See Comments)    Has patient had a PCN reaction causing immediate rash, facial/tongue/throat swelling, SOB or lightheadedness with hypotension: Yes Has patient had a PCN reaction causing severe rash involving mucus membranes or skin necrosis: No Has patient had a PCN reaction that required hospitalization No Has patient had a PCN reaction occurring within the last 10 years: No If all of the above answers are "NO", then may proceed with Cephalosporin use.  . Codeine   . Demerol [Meperidine] Rash    MEDICATIONS AT HOME:   Prior to Admission medications   Medication Sig Start Date End Date Taking? Authorizing Provider  acetaminophen (TYLENOL) 500 MG tablet Take 500 mg by mouth every 6 (six) hours as needed for mild pain or headache.   Yes Historical Provider, MD  glipiZIDE (GLUCOTROL XL) 10 MG 24 hr tablet Take 10 mg by mouth 2 (two) times daily.   Yes Historical Provider, MD  lisinopril (PRINIVIL,ZESTRIL) 40 MG tablet Take 40 mg by mouth daily.   Yes Historical Provider, MD  metFORMIN (GLUCOPHAGE-XR) 500 MG 24 hr tablet Take 500 mg by mouth 2 (two) times daily.   Yes Historical Provider, MD  metoprolol succinate (TOPROL-XL) 25 MG 24 hr tablet Take 25 mg by mouth daily.   Yes Historical Provider, MD  pantoprazole (PROTONIX) 40  MG tablet Take 40 mg by mouth daily.   Yes Historical Provider, MD  pravastatin (PRAVACHOL) 40 MG tablet Take 40 mg by mouth at bedtime.   Yes Historical Provider, MD  traZODone (DESYREL) 50 MG tablet Take 50 mg by mouth at bedtime.   Yes Historical Provider, MD  vitamin B-12 (CYANOCOBALAMIN) 1000 MCG tablet Take 1,000 mcg by mouth daily.   Yes Historical Provider, MD  warfarin (COUMADIN) 2.5 MG tablet Take 2.5 mg by mouth at bedtime. Pt takes this dose on Monday and Thursday.   Yes Historical Provider, MD  warfarin (COUMADIN) 5 MG tablet Take 5 mg by mouth at bedtime. Pt takes this dose on Saturday, Tuesday, Wednesday, Friday, and  Saturday.   Yes Historical Provider, MD    REVIEW OF SYSTEMS:  Review of Systems  Constitutional: Negative for fever, chills, weight loss and malaise/fatigue.  HENT: Negative for ear pain, hearing loss and tinnitus.   Eyes: Negative for blurred vision, double vision, pain and redness.  Respiratory: Negative for cough, hemoptysis and shortness of breath.   Cardiovascular: Negative for chest pain, palpitations, orthopnea and leg swelling.  Gastrointestinal: Negative for nausea, vomiting, abdominal pain, diarrhea and constipation.  Genitourinary: Positive for dysuria. Negative for frequency and hematuria.  Musculoskeletal: Positive for falls. Negative for back pain, joint pain and neck pain.  Skin:       No acne, rash, or lesions  Neurological: Negative for dizziness, tremors, focal weakness and weakness.  Endo/Heme/Allergies: Negative for polydipsia. Does not bruise/bleed easily.  Psychiatric/Behavioral: Negative for depression. The patient is not nervous/anxious and does not have insomnia.      VITAL SIGNS:   Filed Vitals:   01/04/16 2000 01/04/16 2115 01/04/16 2118 01/04/16 2130  BP: 133/84  160/69 180/90  Pulse: 93 92  90  Resp: SpO2: 100% 94%  99%   Wt Readings from Last 3 Encounters:  No data found for Wt    PHYSICAL EXAMINATION:  Physical Exam  Vitals reviewed. Constitutional: She appears well-developed and well-nourished. No distress.  HENT:  Head: Normocephalic and atraumatic.  Mouth/Throat: Oropharynx is clear and moist.  Eyes: Conjunctivae and EOM are normal. Pupils are equal, round, and reactive to light. No scleral icterus.  Neck: Normal range of motion. Neck supple. No JVD present. No thyromegaly present.  Cardiovascular: Normal rate, regular rhythm and intact distal pulses.  Exam reveals no gallop and no friction rub.   No murmur heard. Respiratory: Effort normal and breath sounds normal. No respiratory distress. She has no wheezes. She has no rales.   GI: Soft. Bowel sounds are normal. She exhibits no distension. There is no tenderness.  Musculoskeletal: Normal range of motion. She exhibits no edema.  No arthritis, no gout  Lymphadenopathy:    She has no cervical adenopathy.  Neurological: She is alert. No cranial nerve deficit.  Patient converses and follows commands but is unable to provide clear details or answer questions completely appropriately. No dysarthria, no aphasia  Skin: Skin is warm and dry. No rash noted. No erythema.    LABORATORY PANEL:   CBC  Recent Labs Lab 01/04/16 1951  WBC 17.2*  HGB 13.3  HCT 39.3  PLT 181   ------------------------------------------------------------------------------------------------------------------  Chemistries   Recent Labs Lab 01/04/16 1951  NA 136  K 3.7  CL 97*  CO2 29  GLUCOSE 253*  BUN 12  CREATININE 0.72  CALCIUM 9.0   ------------------------------------------------------------------------------------------------------------------  Cardiac Enzymes  Recent Labs Lab 01/04/16 1951  TROPONINI 0.04*   ------------------------------------------------------------------------------------------------------------------  RADIOLOGY:  Dg Chest 1 View  01/04/2016  CLINICAL DATA:  Injury from fall. Patient fell last night and was found on floor today by family. EXAM: CHEST 1 VIEW COMPARISON:  None. FINDINGS: Cardiac silhouette. No pulmonary contusion or pleural fluid. No pneumothorax. No evidence fracture. IMPRESSION: No evidence of thoracic trauma. Electronically Signed   By: Genevive Bi M.D.   On: 01/04/2016 21:13   Dg Thoracic Spine 2 View  01/04/2016  CLINICAL DATA:  Fall EXAM: THORACIC SPINE 2 VIEWS COMPARISON:  None. FINDINGS: Normal alignment of the thoracic vertebral bodies. No loss of vertebral body height or disc height. No subluxation. Normal paraspinal lines. IMPRESSION: No evidence of thoracic spine fracture. Electronically Signed   By: Genevive Bi M.D.   On: 01/04/2016 21:17   Dg Lumbar Spine Complete  01/04/2016  CLINICAL DATA:  Unwitnessed fall. EXAM: LUMBAR SPINE - COMPLETE 4+ VIEW COMPARISON:  None. FINDINGS: Diffuse osteopenia is noted. Atherosclerosis of abdominal aorta is noted. No fracture or spondylolisthesis is noted. Mild degenerative disc disease is noted at T12-L1. Posterior facet joints are unremarkable. IMPRESSION: No acute abnormality seen in the lumbar spine. Atherosclerosis of abdominal aorta is noted. Electronically Signed   By: Lupita Raider, M.D.   On: 01/04/2016 21:11   Dg Pelvis 1-2 Views  01/04/2016  CLINICAL DATA:  Injury from fall. Patient fell last night and was found on floor today by family. EXAM: PELVIS - 1-2 VIEW COMPARISON:  None FINDINGS: Hips are located.  No pelvic fracture or sacral fracture. IMPRESSION: No evidence of pelvic fracture or hip fracture on single view Electronically Signed   By: Genevive Bi M.D.   On: 01/04/2016 21:15   Dg Shoulder Right  01/04/2016  CLINICAL DATA:  Injury from fall EXAM: RIGHT SHOULDER - 2+ VIEW COMPARISON:  None. FINDINGS: Glenohumeral joint is intact. No evidence of scapular fracture or humeral fracture. The acromioclavicular joint is intact. Large loose body inferior to the joint. Degenerate changes of the humeral head. IMPRESSION: 1. No evidence acute fracture or dislocation. 2. Chronic degenerative changes. Electronically Signed   By: Genevive Bi M.D.   On: 01/04/2016 21:16   Ct Head Wo Contrast  01/04/2016  CLINICAL DATA:  Unwitnessed fall. EXAM: CT HEAD WITHOUT CONTRAST CT CERVICAL SPINE WITHOUT CONTRAST TECHNIQUE: Multidetector CT imaging of the head and cervical spine was performed following the standard protocol without intravenous contrast. Multiplanar CT image reconstructions of the cervical spine were also generated. COMPARISON:  None. FINDINGS: CT HEAD FINDINGS Bony calvarium appears intact. Mild diffuse cortical atrophy is noted. Mild chronic  ischemic white matter disease is noted. No mass effect or midline shift is noted. Ventricular size is within normal limits. There is no evidence of mass lesion, hemorrhage or acute infarction. CT CERVICAL SPINE FINDINGS No fracture or spondylolisthesis is noted. Moderate multilevel degenerative disc disease is noted extending from C3-4 to C6-7. Visualized lung apices appear normal. IMPRESSION: Mild diffuse cortical atrophy. Mild chronic ischemic white matter disease. No acute intracranial abnormality seen. Moderate multilevel degenerative disc disease. No acute abnormality seen in the cervical spine. Electronically Signed   By: Lupita Raider, M.D.   On: 01/04/2016 20:52   Ct Cervical Spine Wo Contrast  01/04/2016  CLINICAL DATA:  Unwitnessed fall. EXAM: CT HEAD WITHOUT CONTRAST CT CERVICAL SPINE WITHOUT CONTRAST TECHNIQUE: Multidetector CT imaging of the head and cervical spine was performed following the standard protocol without intravenous contrast. Multiplanar CT image reconstructions  of the cervical spine were also generated. COMPARISON:  None. FINDINGS: CT HEAD FINDINGS Bony calvarium appears intact. Mild diffuse cortical atrophy is noted. Mild chronic ischemic white matter disease is noted. No mass effect or midline shift is noted. Ventricular size is within normal limits. There is no evidence of mass lesion, hemorrhage or acute infarction. CT CERVICAL SPINE FINDINGS No fracture or spondylolisthesis is noted. Moderate multilevel degenerative disc disease is noted extending from C3-4 to C6-7. Visualized lung apices appear normal. IMPRESSION: Mild diffuse cortical atrophy. Mild chronic ischemic white matter disease. No acute intracranial abnormality seen. Moderate multilevel degenerative disc disease. No acute abnormality seen in the cervical spine. Electronically Signed   By: Lupita Raider, M.D.   On: 01/04/2016 20:52   Dg Knee Complete 4 Views Left  01/04/2016  CLINICAL DATA:  Injury from fall.  Patient fell last night and was found on floor today by family. EXAM: LEFT KNEE - COMPLETE 4+ VIEW COMPARISON:  None. FINDINGS: Joint space narrowing and osteophytosis of the lateral compartment. No acute fracture or dislocation. Small suprapatellar joint effusion. IMPRESSION: No fracture.  Small effusion. Osteoarthritis of the lateral compartment Electronically Signed   By: Genevive Bi M.D.   On: 01/04/2016 21:14   Dg Humerus Right  01/04/2016  CLINICAL DATA:  Unwitnessed fall. EXAM: RIGHT HUMERUS - 2+ VIEW COMPARISON:  None. FINDINGS: Irregularity is seen involving right humeral head suggesting possible mildly displaced fracture. No other significant abnormality seen involving the remaining portion of the right humerus. IMPRESSION: Possible mildly displaced right humeral head fracture. CT scan may be performed for further evaluation. Electronically Signed   By: Lupita Raider, M.D.   On: 01/04/2016 21:15    EKG:   Orders placed or performed during the hospital encounter of 01/04/16  . ED EKG  . ED EKG    IMPRESSION AND PLAN:  Principal Problem:   Acute encephalopathy - Most likely due to her UTI, see below for treatment of the same, monitor closely with expected improvement has her UTI improves. Active Problems:   UTI (lower urinary tract infection) - IV antibiotics, send urine culture and follow up for antibiotic tailoring   Type 2 diabetes mellitus (HCC) - sliding scale insulin with corresponding glucose checks and carb modified diet   HTN (hypertension) - continue home meds   HLD (hyperlipidemia) - continue home meds   GERD (gastroesophageal reflux disease) - home dose PPI  All the records are reviewed and case discussed with ED provider. Management plans discussed with the patient and/or family.  DVT PROPHYLAXIS: Systemic anticoagulation  GI PROPHYLAXIS: PPI  ADMISSION STATUS: Inpatient  CODE STATUS: Full Code Status History    This patient does not have a recorded code  status. Please follow your organizational policy for patients in this situation.      TOTAL TIME TAKING CARE OF THIS PATIENT: 45 minutes.    Dilon Lank FIELDING 01/04/2016, 10:39 PM  [facility]  Office  [phone]  CC: Primary care physician; [facility], Madaline Guthrie, MD

## 2016-01-04 NOTE — ED Notes (Signed)
Lab called with troponin 0.04  Dr Sharma Covertnorman aware

## 2016-01-05 ENCOUNTER — Inpatient Hospital Stay
Admit: 2016-01-05 | Discharge: 2016-01-05 | Disposition: A | Discharge status: Home / Self Care | Payer: Commercial Managed Care - HMO | Attending: Internal Medicine | Admitting: Internal Medicine

## 2016-01-05 DIAGNOSIS — A419 Sepsis, unspecified organism: Secondary | ICD-10-CM | POA: Diagnosis not present

## 2016-01-05 DIAGNOSIS — G9341 Metabolic encephalopathy: Secondary | ICD-10-CM | POA: Diagnosis not present

## 2016-01-05 DIAGNOSIS — N10 Acute tubulo-interstitial nephritis: Secondary | ICD-10-CM | POA: Diagnosis not present

## 2016-01-05 DIAGNOSIS — I10 Essential (primary) hypertension: Secondary | ICD-10-CM | POA: Diagnosis not present

## 2016-01-05 DIAGNOSIS — R748 Abnormal levels of other serum enzymes: Secondary | ICD-10-CM | POA: Diagnosis not present

## 2016-01-05 DIAGNOSIS — R7989 Other specified abnormal findings of blood chemistry: Secondary | ICD-10-CM | POA: Diagnosis not present

## 2016-01-05 LAB — TROPONIN I
TROPONIN I: 0.04 ng/mL — AB (ref <0.031)
TROPONIN I: 0.03 ng/mL (ref <0.031)

## 2016-01-05 LAB — ECHOCARDIOGRAM COMPLETE
HEIGHTINCHES: 65 in
WEIGHTICAEL: 2734.4 [oz_av]

## 2016-01-05 LAB — BASIC METABOLIC PANEL
ANION GAP: 9 (ref 5–15)
BUN: 18 mg/dL (ref 6–20)
CALCIUM: 8.4 mg/dL — AB (ref 8.9–10.3)
CO2: 27 mmol/L (ref 22–32)
CREATININE: 0.76 mg/dL (ref 0.44–1.00)
Chloride: 99 mmol/L — ABNORMAL LOW (ref 101–111)
Glucose, Bld: 205 mg/dL — ABNORMAL HIGH (ref 65–99)
Potassium: 3.5 mmol/L (ref 3.5–5.1)
SODIUM: 135 mmol/L (ref 135–145)

## 2016-01-05 LAB — GLUCOSE, CAPILLARY
GLUCOSE-CAPILLARY: 192 mg/dL — AB (ref 65–99)
GLUCOSE-CAPILLARY: 228 mg/dL — AB (ref 65–99)
Glucose-Capillary: 236 mg/dL — ABNORMAL HIGH (ref 65–99)
Glucose-Capillary: 187 mg/dL — ABNORMAL HIGH (ref 65–99)
Glucose-Capillary: 183 mg/dL — ABNORMAL HIGH (ref 65–99)

## 2016-01-05 LAB — CBC
HEMATOCRIT: 35.5 % (ref 35.0–47.0)
Hemoglobin: 12 g/dL (ref 12.0–16.0)
MCH: 29.2 pg (ref 26.0–34.0)
MCHC: 33.8 g/dL (ref 32.0–36.0)
MCV: 86.4 fL (ref 80.0–100.0)
Platelets: 163 10*3/uL (ref 150–440)
RBC: 4.11 MIL/uL (ref 3.80–5.20)
RDW: 14.1 % (ref 11.5–14.5)
WBC: 14.5 10*3/uL — AB (ref 3.6–11.0)

## 2016-01-05 LAB — HEMOGLOBIN A1C: Hgb A1c MFr Bld: 7.3 % — ABNORMAL HIGH (ref 4.0–6.0)

## 2016-01-05 MED ORDER — GLUCERNA PO LIQD
237.0000 mL | Freq: Three times a day (TID) | ORAL | Status: DC
  Filled 2016-01-05: qty 237

## 2016-01-05 MED ORDER — PRAVASTATIN SODIUM 20 MG PO TABS
40.0000 mg | ORAL_TABLET | Freq: Every day | ORAL | Status: DC
  Administered 2016-01-05: 40 mg via ORAL
  Filled 2016-01-05: qty 2

## 2016-01-05 MED ORDER — ONDANSETRON HCL 4 MG PO TABS: 4.0000 mg | ORAL_TABLET | Freq: Four times a day (QID) | ORAL | Status: DC | PRN

## 2016-01-05 MED ORDER — INSULIN ASPART 100 UNIT/ML ~~LOC~~ SOLN: 0.0000 [IU] | Freq: Every day | SUBCUTANEOUS | Status: DC

## 2016-01-05 MED ORDER — PANTOPRAZOLE SODIUM 40 MG PO TBEC
40.0000 mg | DELAYED_RELEASE_TABLET | Freq: Every day | ORAL | Status: DC
  Administered 2016-01-05 – 2016-01-06 (×2): 40 mg via ORAL
  Filled 2016-01-05 (×2): qty 1

## 2016-01-05 MED ORDER — SODIUM CHLORIDE 0.9 % IV SOLN
1.0000 g | Freq: Two times a day (BID) | INTRAVENOUS | Status: DC
  Administered 2016-01-05: 1 g via INTRAVENOUS
  Filled 2016-01-05 (×2): qty 1

## 2016-01-05 MED ORDER — METOPROLOL SUCCINATE ER 25 MG PO TB24
25.0000 mg | ORAL_TABLET | Freq: Every day | ORAL | Status: DC
  Administered 2016-01-05 – 2016-01-06 (×2): 25 mg via ORAL
  Filled 2016-01-05 (×2): qty 1

## 2016-01-05 MED ORDER — POTASSIUM CHLORIDE IN NACL 20-0.9 MEQ/L-% IV SOLN
INTRAVENOUS | Status: DC
  Administered 2016-01-05: 10:00:00 via INTRAVENOUS
  Filled 2016-01-05: qty 1000

## 2016-01-05 MED ORDER — TRAZODONE HCL 50 MG PO TABS: 50.0000 mg | ORAL_TABLET | Freq: Every evening | ORAL | Status: DC | PRN

## 2016-01-05 MED ORDER — INSULIN ASPART 100 UNIT/ML ~~LOC~~ SOLN
0.0000 [IU] | Freq: Three times a day (TID) | SUBCUTANEOUS | Status: DC
  Administered 2016-01-05 (×2): 2 [IU] via SUBCUTANEOUS
  Administered 2016-01-05: 3 [IU] via SUBCUTANEOUS
  Administered 2016-01-06: 2 [IU] via SUBCUTANEOUS
  Administered 2016-01-06: 5 [IU] via SUBCUTANEOUS
  Filled 2016-01-05: qty 5
  Filled 2016-01-05: qty 3
  Filled 2016-01-05 (×3): qty 2

## 2016-01-05 MED ORDER — WARFARIN SODIUM 5 MG PO TABS: 2.5000 mg | ORAL_TABLET | ORAL | Status: DC

## 2016-01-05 MED ORDER — ONDANSETRON HCL 4 MG/2ML IJ SOLN: 4.0000 mg | Freq: Four times a day (QID) | INTRAMUSCULAR | Status: DC | PRN

## 2016-01-05 MED ORDER — GLUCERNA SHAKE PO LIQD
237.0000 mL | Freq: Three times a day (TID) | ORAL | Status: DC
  Administered 2016-01-05 – 2016-01-06 (×4): 237 mL via ORAL

## 2016-01-05 MED ORDER — SODIUM CHLORIDE 0.9% FLUSH: 3.0000 mL | Freq: Two times a day (BID) | INTRAVENOUS | Status: DC

## 2016-01-05 MED ORDER — CEPHALEXIN 250 MG/5ML PO SUSR
250.0000 mg | Freq: Three times a day (TID) | ORAL | Status: DC
  Administered 2016-01-05 – 2016-01-06 (×3): 250 mg via ORAL
  Filled 2016-01-05 (×6): qty 5

## 2016-01-05 MED ORDER — LISINOPRIL 20 MG PO TABS
40.0000 mg | ORAL_TABLET | Freq: Every day | ORAL | Status: DC
  Administered 2016-01-05 – 2016-01-06 (×2): 40 mg via ORAL
  Filled 2016-01-05 (×2): qty 2

## 2016-01-05 MED ORDER — WARFARIN SODIUM 5 MG PO TABS
5.0000 mg | ORAL_TABLET | ORAL | Status: DC
  Administered 2016-01-05: 5 mg via ORAL
  Filled 2016-01-05: qty 1

## 2016-01-05 NOTE — Progress Notes (Signed)
Patient has loss IV access. RN stuck patient twice unsuccessfully, and another RN on the unit could not find anywhere to stick. RN called hospital supervisor Annice PihJackie RN; she stuck patient twice without success. RN has called ICU and they are sending a RN to try to find a vein to stick.   Harvie HeckMelanie Claramae Rigdon, RN

## 2016-01-05 NOTE — Progress Notes (Signed)
ANTICOAGULATION CONSULT NOTE - Initial Consult  Pharmacy Consult for warfarin dosing Indication: VTE prophylaxis  Allergies  Allergen Reactions  . Penicillins Anaphylaxis, Rash and Other (See Comments)    Has patient had a PCN reaction causing immediate rash, facial/tongue/throat swelling, SOB or lightheadedness with hypotension: Yes Has patient had a PCN reaction causing severe rash involving mucus membranes or skin necrosis: No Has patient had a PCN reaction that required hospitalization No Has patient had a PCN reaction occurring within the last 10 years: No If all of the above answers are "NO", then may proceed with Cephalosporin use.  . Codeine   . Demerol [Meperidine] Rash    Patient Measurements: Height: 5\' 5"  (165.1 cm) Weight: 170 lb 14.4 oz (77.52 kg) IBW/kg (Calculated) : 57 Heparin Dosing Weight: n/a  Vital Signs: Temp: 99.5 F (37.5 C) (05/16 0036) Temp Source: Axillary (05/16 0036) BP: 155/61 mmHg (05/16 0036) Pulse Rate: 89 (05/16 0036)  Labs:  Recent Labs  01/04/16 1951  HGB 13.3  HCT 39.3  PLT 181  LABPROT 19.5*  INR 1.65  CREATININE 0.72  CKTOTAL 434*  TROPONINI 0.04*    Estimated Creatinine Clearance: 49.1 mL/min (by C-G formula based on Cr of 0.72).   Medical History: Past Medical History  Diagnosis Date  . HTN (hypertension)   . HLD (hyperlipidemia)   . COPD (chronic obstructive pulmonary disease) (HCC)   . GERD (gastroesophageal reflux disease)   . Type 2 diabetes mellitus (HCC)   . Sleep apnea   . DVT (deep venous thrombosis) (HCC)   . PE (pulmonary embolism)     on chronic anticoagulation    Medications:    Assessment: INR 1.65  Goal of Therapy:  INR 2-3    Plan:  Home dose of warfarin 2.5 mg Monday, Thursday and 5 mg all other days ordered. F/u INR in AM.  Mattias Walmsley S 01/05/2016,3:11 AM

## 2016-01-05 NOTE — Progress Notes (Signed)
Pharmacy Antibiotic Note  Natalie Rogers is a 82 y.o. female admitted on 01/04/2016 with UTI.  Pharmacy has been consulted for meropenem dosing.  Plan: Meropenem 1 gram q 12 hours ordered.  Height: 5\' 5"  (165.1 cm) Weight: 170 lb 14.4 oz (77.52 kg) IBW/kg (Calculated) : 57  Temp (24hrs), Avg:99.5 F (37.5 C), Min:99.5 F (37.5 C), Max:99.5 F (37.5 C)   Recent Labs Lab 01/04/16 1951  WBC 17.2*  CREATININE 0.72    Estimated Creatinine Clearance: 49.1 mL/min (by C-G formula based on Cr of 0.72).    Allergies  Allergen Reactions  . Penicillins Anaphylaxis, Rash and Other (See Comments)    Has patient had a PCN reaction causing immediate rash, facial/tongue/throat swelling, SOB or lightheadedness with hypotension: Yes Has patient had a PCN reaction causing severe rash involving mucus membranes or skin necrosis: No Has patient had a PCN reaction that required hospitalization No Has patient had a PCN reaction occurring within the last 10 years: No If all of the above answers are "NO", then may proceed with Cephalosporin use.  . Codeine   . Demerol [Meperidine] Rash    Antimicrobials this admission: meropenem  >>    >>   Dose adjustments this admission:   Microbiology results:  5/15 UCx: pending   5/15 UA: LE(tr) NO2(+) WBC 0-5  Thank you for allowing pharmacy to be a part of this patient's care.  Lahela Woodin S 01/05/2016 3:06 AM

## 2016-01-05 NOTE — Care Management Note (Signed)
Case Management Note  Patient Details  Name: RAESHAWN TAFOLLA MRN: [id number] Date of Birth: 01/26/1924  Subjective/Objective:                  Met with patient to discuss discharge planning. She lives alone. Her daughter Marcie Bal "is in and out". She uses a rollator to ambulate however she states she fell. She does not have a front-wheeled walker. She agrees to home health if needed. Her PCP is with [facility] Dr. Ronelle Nigh. She uses [facility]. She would like to use [facility] if she needs home health.   Action/Plan: List of home health agencies left with patient. Referral to [facility]. RNCM will continue to follow PT pending.   Expected Discharge Date:                  Expected Discharge Plan:     In-House Referral:     Discharge planning Services  CM Consult  Post Acute Care Choice:  Home Health Choice offered to:  Patient  DME Arranged:    DME Agency:     HH Arranged:    [place] Agency:  [place]  Status of Service:  In process, will continue to follow  Medicare Important Message Given:    Date Medicare IM Given:    Medicare IM give by:    Date Additional Medicare IM Given:    Additional Medicare Important Message give by:     If discussed at [place] of Stay Meetings, dates discussed:    Additional Comments:  Marshell Garfinkel, RN 01/05/2016, 10:16 AM

## 2016-01-05 NOTE — Evaluation (Signed)
Physical Therapy Evaluation Patient Details Name: Natalie DoppDorothy M Samara MRN: [id number] DOB: 1923-12-12 Today's Date: 01/05/2016   History of Present Illness  Pt is admitted for acute encephalopathy and UTI. Pt with complaints of fall and confusion. Pt with history of HTN, COPD, GERD, DM, DVT, and PE.   Clinical Impression  Pt is a pleasant 82 year old female who was admitted for acute encephalopathy. Pt performs bed mobility with mod assist and transfers/ambulation with min assist and rw. Pt fatigues quickly with limited ambulation. Pt demonstrates deficits with strength/mobility/pain/endurance.  Would benefit from skilled PT to address above deficits and promote optimal return to PLOF; recommend transition to STR upon discharge from acute hospitalization.       Follow Up Recommendations SNF    Equipment Recommendations       Recommendations for Other Services       Precautions / Restrictions Precautions Precautions: Fall Restrictions Weight Bearing Restrictions: No      Mobility  Bed Mobility Overal bed mobility: Needs Assistance Bed Mobility: Supine to Sit     Supine to sit: Mod assist     General bed mobility comments: assist for bed mobility with assist for initiation. Once seated at EOB, pt able to sit with supervision.  Transfers Overall transfer level: Needs assistance Equipment used: Rolling walker (2 wheeled) Transfers: Sit to/from Stand Sit to Stand: Min assist         General transfer comment: transfers performed with min assist. 2 attempts for upright posture. Cues for pushing from seated surface instead of pulling up on rw.  Ambulation/Gait Ambulation/Gait assistance: Min assist Ambulation Distance (Feet): 3 Feet Assistive device: Rolling walker (2 wheeled) Gait Pattern/deviations: Step-through pattern     General Gait Details: ambulated using rw and min assist. Step to gait pattern performed with pt increased sway with dynamic balance. Pt fatigues  quickly with increased distance  Stairs            Wheelchair Mobility    Modified Rankin (Stroke Patients Only)       Balance Overall balance assessment: Needs assistance;History of Falls Sitting-balance support: Feet supported Sitting balance-Leahy Scale: Fair     Standing balance support: Bilateral upper extremity supported Standing balance-Leahy Scale: Fair                               Pertinent Vitals/Pain Pain Assessment: 0-10 Pain Score: 8  Pain Location: low back Pain Descriptors / Indicators: Aching;Dull Pain Intervention(s): Limited activity within patient's tolerance    Home Living Family/patient expects to be discharged to:: Private residence Living Arrangements: Alone Available Help at Discharge:  (has children able to check on her) Type of Home: House Home Access: Level entry     Home Layout: One level Home Equipment: Environmental consultantWalker - 2 wheels      Prior Function Level of Independence: Independent with assistive device(s)         Comments: uses RW for all mobility     Hand Dominance        Extremity/Trunk Assessment   Upper Extremity Assessment: Generalized weakness (B UE grossly 3/5)           Lower Extremity Assessment: Generalized weakness (B LE grossly 3-/5)         Communication   Communication: No difficulties  Cognition Arousal/Alertness: Awake/alert Behavior During Therapy: WFL for tasks assessed/performed Overall Cognitive Status: Within Functional Limits for tasks assessed  General Comments      Exercises Other Exercises Other Exercises: Pt performed supine/seated ther-ex including ankle pumps, quad sets, elbow flexion/extension, and LAQ. All ther-ex performed x 10 reps with min assist for correct technique      Assessment/Plan    PT Assessment Patient needs continued PT services  PT Diagnosis Difficulty walking;Generalized weakness   PT Problem List Decreased  strength;Decreased balance;Decreased mobility  PT Treatment Interventions DME instruction;Gait training;Therapeutic exercise   PT Goals (Current goals can be found in the Care Plan section) Acute Rehab PT Goals Patient Stated Goal: to get stronger PT Goal Formulation: With patient Time For Goal Achievement: 01/19/16 Potential to Achieve Goals: Good    Frequency Min 2X/week   Barriers to discharge        Co-evaluation               End of Session Equipment Utilized During Treatment: Gait belt Activity Tolerance: Patient tolerated treatment well Patient left: in chair;with chair alarm set Nurse Communication: Mobility status         Time: 1610-9604 PT Time Calculation (min) (ACUTE ONLY): 18 min   Charges:   PT Evaluation $PT Eval Moderate Complexity: 1 Procedure PT Treatments $Therapeutic Exercise: 8-22 mins   PT G Codes:        Yanelle Sousa 01-10-16, 1:14 PM  Elizabeth Palau, PT, DPT [phone]

## 2016-01-05 NOTE — [place] FL2 (Signed)
[place] MEDICAID FL2 LEVEL OF CARE SCREENING TOOL     IDENTIFICATION  Patient Name: Natalie Rogers Birthdate: Sep 09, 1923 Sex: female Admission Date (Current Location): 01/04/2016  [place]   Facility and Address:  [facility], [address], [place], [place] [address]      Provider Number: [id number]  Attending Physician Name and Address:  Katharina Caperima Vaickute, MD  Relative Name and Phone Number:       Current Level of Care: Hospital Recommended Level of Care: Skilled Nursing Facility Prior Approval Number:    Date Approved/Denied:   PASRR Number:  [id number])  Discharge Plan: SNF    Current Diagnoses: Patient Active Problem List   Diagnosis Date Noted  . Type 2 diabetes mellitus (HCC) 01/04/2016  . Acute encephalopathy 01/04/2016  . UTI (lower urinary tract infection) 01/04/2016  . HTN (hypertension) 01/04/2016  . HLD (hyperlipidemia) 01/04/2016  . GERD (gastroesophageal reflux disease) 01/04/2016    Orientation RESPIRATION BLADDER Height & Weight     Self, Place  Normal Incontinent Weight: 170 lb 14.4 oz (77.52 kg) Height:  5\' 5"  (165.1 cm)  BEHAVIORAL SYMPTOMS/MOOD NEUROLOGICAL BOWEL NUTRITION STATUS   (none)  (none) Continent Diet (Diet: Carb Modified )  AMBULATORY STATUS COMMUNICATION OF NEEDS Skin   Extensive Assist Verbally Normal                       Personal Care Assistance Level of Assistance  Bathing, Feeding, Dressing Bathing Assistance: Limited assistance Feeding assistance: Independent Dressing Assistance: Limited assistance     Functional Limitations Info  Sight, Hearing, Speech Sight Info: Impaired Hearing Info: Adequate Speech Info: Adequate    SPECIAL CARE FACTORS FREQUENCY  PT (By licensed PT), OT (By licensed OT)     PT Frequency:  (5) OT Frequency:  (5)            Contractures      Additional Factors Info  Code Status, Allergies, Insulin Sliding Scale Code  Status Info:  (Full Code. ) Allergies Info:  (Penicillins, Codeine, Demerol)   Insulin Sliding Scale Info:  (NovoLog Insulin Injections)       Current Medications (01/05/2016):  This is the current hospital active medication list Current Facility-Administered Medications  Medication Dose Route Frequency Provider Last Rate Last Dose  . cephALEXin (KEFLEX) 250 MG/5ML suspension 250 mg  250 mg Oral Q8H Katharina Caperima Vaickute, MD      . GLUCERNA (GLUCERNA) liquid 237 mL  237 mL Oral TID BM Katharina Caperima Vaickute, MD      . insulin aspart (novoLOG) injection 0-5 Units  0-5 Units Subcutaneous QHS Oralia Manisavid Willis, MD      . insulin aspart (novoLOG) injection 0-9 Units  0-9 Units Subcutaneous TID WC Oralia Manisavid Willis, MD   3 Units at 01/05/16 1223  . lisinopril (PRINIVIL,ZESTRIL) tablet 40 mg  40 mg Oral Daily Oralia Manisavid Willis, MD   40 mg at 01/05/16 0907  . meropenem (MERREM) 1 g in sodium chloride 0.9 % 100 mL IVPB  1 g Intravenous Q12H Oralia Manisavid Willis, MD   1 g at 01/05/16 0400  . metoprolol succinate (TOPROL-XL) 24 hr tablet 25 mg  25 mg Oral Daily Oralia Manisavid Willis, MD   25 mg at 01/05/16 0907  . ondansetron (ZOFRAN) tablet 4 mg  4 mg Oral Q6H PRN Oralia Manisavid Willis, MD       Or  . ondansetron [facility](ZOFRAN) injection 4 mg  4 mg Intravenous Q6H PRN Oralia Manisavid Willis, MD      .  pantoprazole (PROTONIX) EC tablet 40 mg  40 mg Oral Daily Oralia Manis, MD   40 mg at 01/05/16 0908  . pravastatin (PRAVACHOL) tablet 40 mg  40 mg Oral QHS Oralia Manis, MD      . sodium chloride flush (NS) 0.9 % injection 3 mL  3 mL Intravenous Q12H Oralia Manis, MD      . traZODone (DESYREL) tablet 50 mg  50 mg Oral QHS PRN Oralia Manis, MD      . Melene Muller ON 01/07/2016] warfarin (COUMADIN) tablet 2.5 mg  2.5 mg Oral Once per day on Mon Thu Oralia Manis, MD      . warfarin (COUMADIN) tablet 5 mg  5 mg Oral Once per day on Sun Tue Wed Fri Sat Oralia Manis, MD         Discharge Medications: Please see discharge summary for a list of discharge medications.  Relevant Imaging  Results:  Relevant Lab Results:   Additional Information  (SSN: [id number])  Haig Prophet, LCSW

## 2016-01-05 NOTE — Progress Notes (Signed)
Not able to establish IV access. RN spoke with Dr. Winona LegatoVaickute. MD discontinued IV fluids and ordered PO antibiotics. MD also ordered a nutritional supplement. RN educated patient and daughter about the importance of drinking plenty of water to prevent dehydration.   Harvie HeckMelanie Jdyn Parkerson, RN

## 2016-01-05 NOTE — ED Notes (Signed)
Patient transported to room 159

## 2016-01-05 NOTE — Clinical Social Work Placement (Signed)
   CLINICAL SOCIAL WORK PLACEMENT  NOTE  Date:  01/05/2016  Patient Details  Name: Natalie Rogers MRN: [id number] Date of Birth: 01-22-1924  Clinical Social Work is seeking post-discharge placement for this patient at the Skilled  Nursing Facility level of care (*CSW will initial, date and re-position this form in  chart as items are completed):  Yes   Patient/family provided with [place] Clinical Social Work Department's list of facilities offering this level of care within the geographic area requested by the patient (or if unable, by the patient's family).  Yes   Patient/family informed of their freedom to choose among providers that offer the needed level of care, that participate in Medicare, Medicaid or managed care program needed by the patient, have an available bed and are willing to accept the patient.  Yes   Patient/family informed of [facility]'s ownership interest in [facility] and [facility], as well as of the fact that they are under no obligation to receive care at these facilities.  PASRR submitted to EDS on 01/05/16     PASRR number received on 01/05/16     Existing PASRR number confirmed on       FL2 transmitted to all facilities in geographic area requested by pt/family on 01/05/16     FL2 transmitted to all facilities within larger geographic area on       Patient informed that his/her managed care company has contracts with or will negotiate with certain facilities, including the following:            Patient/family informed of bed offers received.  Patient chooses bed at       Physician recommends and patient chooses bed at      Patient to be transferred to   on  .  Patient to be transferred to facility by       Patient family notified on   of transfer.  Name of family member notified:        PHYSICIAN       Additional Comment:    _______________________________________________ Haig ProphetMorgan, Bristyl Mclees G, LCSW 01/05/2016, 1:57 PM

## 2016-01-05 NOTE — ED Notes (Signed)
Report called to floor .  Family with pt.

## 2016-01-05 NOTE — Clinical Social Work Note (Signed)
Clinical Social Work Assessment  Patient Details  Name: Natalie Rogers MRN: [id number] Date of Birth: 1924/06/24  Date of referral:  01/05/16               Reason for consult:  Facility Placement                Permission sought to share information with:  Chartered certified accountant granted to share information::  Yes, Verbal Permission Granted  Name::      [place]::   [place]   Relationship::     Contact Information:     Housing/Transportation Living arrangements for the past 2 months:  [place] of Information:  Patient, Adult Children Patient Interpreter Needed:  None Criminal Activity/Legal Involvement Pertinent to Current Situation/Hospitalization:  No - Comment as needed Significant Relationships:  Adult Children Lives with:  Self Do you feel safe going back to the place where you live?  Yes Need for family participation in patient care:  Yes (Comment)  Care giving concerns:  Patient lives alone in [place] a senior apartment complex in [place].    Social Worker assessment / plan:  Holiday representative (CSW) reviewed chart and noted that PT is recommending SNF. CSW met with patient and her daughter Natalie Rogers [phone] was at bedside. Patient was alert and oriented and reported that she lives in an apartment alone and her daughter Natalie Rogers lives 5 minutes away. Per daughter patient has 6 children that are all spread out. Per Natalie Rogers she takes care of patient. Patient does not have a legal HPOA. CSW briefly explained the importance of having a legal HPOA to daughter and patient. CSW also explained that PT is recommending SNF and patient's insurance [facility] will have to approve SNF stay. Patient and daughter are agreeable to SNF search. SNF list was provided.   FL2 complete and faxed out. Amy [facility] case manager is aware of above. CSW will continue to follow and assist as needed.   Employment status:   Disabled (Comment on whether or not currently receiving Disability), Retired Nurse, adult PT Recommendations:  [place] / Referral to community resources:  [place]  Patient/Family's Response to care:  Patient and daughter are agreeable to SNF search in [place].   Patient/Family's Understanding of and Emotional Response to Diagnosis, Current Treatment, and Prognosis:  Patient and daughter were pleasant and thanked CSW for visit.   Emotional Assessment Appearance:  Appears stated age Attitude/Demeanor/Rapport:    Affect (typically observed):  Accepting, Adaptable, Pleasant Orientation:  Oriented to Self, Oriented to Place, Fluctuating Orientation (Suspected and/or reported Sundowners) Alcohol / Substance use:  Not Applicable Psych involvement (Current and /or in the community):  No (Comment)  Discharge Needs  Concerns to be addressed:  Discharge Planning Concerns Readmission within the last 30 days:  No Current discharge risk:  Dependent with Mobility Barriers to Discharge:  Continued Medical Work up   Loralyn Freshwater, LCSW 01/05/2016, 1:58 PM

## 2016-01-05 NOTE — Care Management Obs Status (Signed)
MEDICARE OBSERVATION STATUS NOTIFICATION   Patient Details  Name: Natalie Rogers MRN: [id number] Date of Birth: 1924-08-08   Medicare Observation Status Notification Given:  Yes Code [address]   Cayson Kalb, RN 01/05/2016, 3:17 PM

## 2016-01-05 NOTE — Progress Notes (Signed)
[facility]  at [facility]   PATIENT NAME: Natalie CooterDorothy Rogers    MR#:  [id number]  DATE OF BIRTH:  [birth year]  SUBJECTIVE:  CHIEF COMPLAINT:   Chief Complaint  Patient presents with  . Fall   Patient is a 82 year old Caucasian female with medical history significant for history of hypertension, hyperlipidemia, COPD, gastroesophageal reflux disease, diabetes mellitus, obstructive sleep apnea who presents to the hospital with complaints of confusion and fall at home. Patient was complaining of some dysuria symptoms for the past one week. On arrival to the hospital patient was noted to have pyuria, concerning for urinary tract infection. Her labs revealed elevated glucose level and mild elevation of troponin, also significant leukocytosis. Patient was admitted to the hospital for further evaluation and treatment. Today she feels better, denies any significant discomfort, although admits of flank pain as well as dysuria symptoms. Urine cultures are pending Review of Systems  Unable to perform ROS: dementia  Genitourinary: Positive for dysuria and flank pain.    VITAL SIGNS: Blood pressure 134/57, pulse 93, temperature 97.8 F (36.6 C), temperature source Oral, resp. rate 18, height 5\' 5"  (1.651 m), weight 77.52 kg (170 lb 14.4 oz), SpO2 96 %.  PHYSICAL EXAMINATION:   GENERAL:  82 y.o.-year-old patient lying in the bed with no acute distress.  EYES: Pupils equal, round, reactive to light and accommodation. No scleral icterus. Extraocular muscles intact.  HEENT: Head atraumatic, normocephalic. Oropharynx and nasopharynx clear.  NECK:  Supple, no jugular venous distention. No thyroid enlargement, no tenderness.  LUNGS: Normal breath sounds bilaterally, no wheezing, rales,rhonchi or crepitation. No use of accessory muscles of respiration.  CARDIOVASCULAR: S1, S2 normal. No murmurs, rubs, or gallops.  ABDOMEN: Soft, mild discomfort in suprapubic area but no rebound  or guarding, nondistended. Bowel sounds present. No organomegaly or mass. Some CVS tenderness on percussion bilaterally EXTREMITIES: No pedal edema, cyanosis, or clubbing.  NEUROLOGIC: Cranial nerves II through XII are intact. Muscle strength 5/5 in all extremities. Sensation intact. Gait not checked.  PSYCHIATRIC: The patient is alert and oriented x 1.  SKIN: No obvious rash, lesion, or ulcer.   ORDERS/RESULTS REVIEWED:   CBC  Recent Labs Lab 01/04/16 1951 01/05/16 0640  WBC 17.2* 14.5*  HGB 13.3 12.0  HCT 39.3 35.5  PLT 181 163  MCV 86.6 86.4  MCH 29.2 29.2  MCHC 33.8 33.8  RDW 14.1 14.1   ------------------------------------------------------------------------------------------------------------------  Chemistries   Recent Labs Lab 01/04/16 1951 01/05/16 0640  NA 136 135  K 3.7 3.5  CL 97* 99*  CO2 29 27  GLUCOSE 253* 205*  BUN 12 18  CREATININE 0.72 0.76  CALCIUM 9.0 8.4*   ------------------------------------------------------------------------------------------------------------------ estimated creatinine clearance is 49.1 mL/min (by C-G formula based on Cr of 0.76). ------------------------------------------------------------------------------------------------------------------ No results for input(s): TSH, T4TOTAL, T3FREE, THYROIDAB in the last 72 hours.  Invalid input(s): FREET3  Cardiac Enzymes  Recent Labs Lab 01/05/16 0104 01/05/16 0640 01/05/16 1224  TROPONINI 0.04* <0.03 0.03   ------------------------------------------------------------------------------------------------------------------ Invalid input(s): POCBNP ---------------------------------------------------------------------------------------------------------------  RADIOLOGY: Dg Chest 1 View  01/04/2016  CLINICAL DATA:  Injury from fall. Patient fell last night and was found on floor today by family. EXAM: CHEST 1 VIEW COMPARISON:  None. FINDINGS: Cardiac silhouette. No pulmonary  contusion or pleural fluid. No pneumothorax. No evidence fracture. IMPRESSION: No evidence of thoracic trauma. Electronically Signed   By: Genevive BiStewart  Edmunds M.D.   On: 01/04/2016 21:13   Dg Thoracic Spine 2 View  01/04/2016  CLINICAL  DATA:  Fall EXAM: THORACIC SPINE 2 VIEWS COMPARISON:  None. FINDINGS: Normal alignment of the thoracic vertebral bodies. No loss of vertebral body height or disc height. No subluxation. Normal paraspinal lines. IMPRESSION: No evidence of thoracic spine fracture. Electronically Signed   By: Genevive Bi M.D.   On: 01/04/2016 21:17   Dg Lumbar Spine Complete  01/04/2016  CLINICAL DATA:  Unwitnessed fall. EXAM: LUMBAR SPINE - COMPLETE 4+ VIEW COMPARISON:  None. FINDINGS: Diffuse osteopenia is noted. Atherosclerosis of abdominal aorta is noted. No fracture or spondylolisthesis is noted. Mild degenerative disc disease is noted at T12-L1. Posterior facet joints are unremarkable. IMPRESSION: No acute abnormality seen in the lumbar spine. Atherosclerosis of abdominal aorta is noted. Electronically Signed   By: Lupita Raider, M.D.   On: 01/04/2016 21:11   Dg Pelvis 1-2 Views  01/04/2016  CLINICAL DATA:  Injury from fall. Patient fell last night and was found on floor today by family. EXAM: PELVIS - 1-2 VIEW COMPARISON:  None FINDINGS: Hips are located.  No pelvic fracture or sacral fracture. IMPRESSION: No evidence of pelvic fracture or hip fracture on single view Electronically Signed   By: Genevive Bi M.D.   On: 01/04/2016 21:15   Dg Shoulder Right  01/04/2016  CLINICAL DATA:  Injury from fall EXAM: RIGHT SHOULDER - 2+ VIEW COMPARISON:  None. FINDINGS: Glenohumeral joint is intact. No evidence of scapular fracture or humeral fracture. The acromioclavicular joint is intact. Large loose body inferior to the joint. Degenerate changes of the humeral head. IMPRESSION: 1. No evidence acute fracture or dislocation. 2. Chronic degenerative changes. Electronically Signed   By:  Genevive Bi M.D.   On: 01/04/2016 21:16   Ct Head Wo Contrast  01/04/2016  CLINICAL DATA:  Unwitnessed fall. EXAM: CT HEAD WITHOUT CONTRAST CT CERVICAL SPINE WITHOUT CONTRAST TECHNIQUE: Multidetector CT imaging of the head and cervical spine was performed following the standard protocol without intravenous contrast. Multiplanar CT image reconstructions of the cervical spine were also generated. COMPARISON:  None. FINDINGS: CT HEAD FINDINGS Bony calvarium appears intact. Mild diffuse cortical atrophy is noted. Mild chronic ischemic white matter disease is noted. No mass effect or midline shift is noted. Ventricular size is within normal limits. There is no evidence of mass lesion, hemorrhage or acute infarction. CT CERVICAL SPINE FINDINGS No fracture or spondylolisthesis is noted. Moderate multilevel degenerative disc disease is noted extending from C3-4 to C6-7. Visualized lung apices appear normal. IMPRESSION: Mild diffuse cortical atrophy. Mild chronic ischemic white matter disease. No acute intracranial abnormality seen. Moderate multilevel degenerative disc disease. No acute abnormality seen in the cervical spine. Electronically Signed   By: Lupita Raider, M.D.   On: 01/04/2016 20:52   Ct Cervical Spine Wo Contrast  01/04/2016  CLINICAL DATA:  Unwitnessed fall. EXAM: CT HEAD WITHOUT CONTRAST CT CERVICAL SPINE WITHOUT CONTRAST TECHNIQUE: Multidetector CT imaging of the head and cervical spine was performed following the standard protocol without intravenous contrast. Multiplanar CT image reconstructions of the cervical spine were also generated. COMPARISON:  None. FINDINGS: CT HEAD FINDINGS Bony calvarium appears intact. Mild diffuse cortical atrophy is noted. Mild chronic ischemic white matter disease is noted. No mass effect or midline shift is noted. Ventricular size is within normal limits. There is no evidence of mass lesion, hemorrhage or acute infarction. CT CERVICAL SPINE FINDINGS No fracture  or spondylolisthesis is noted. Moderate multilevel degenerative disc disease is noted extending from C3-4 to C6-7. Visualized lung apices appear normal. IMPRESSION:  Mild diffuse cortical atrophy. Mild chronic ischemic white matter disease. No acute intracranial abnormality seen. Moderate multilevel degenerative disc disease. No acute abnormality seen in the cervical spine. Electronically Signed   By: Lupita Raider, M.D.   On: 01/04/2016 20:52   Ct Shoulder Right Wo Contrast  01/04/2016  CLINICAL DATA:  Patient fell last night and was found on the floor today. History of dementia. Right shoulder pain. EXAM: CT OF THE RIGHT SHOULDER WITHOUT CONTRAST TECHNIQUE: Multidetector CT imaging was performed according to the standard protocol. Multiplanar CT image reconstructions were also generated. COMPARISON:  Right shoulder 01/04/2016 FINDINGS: Prominent degenerative changes demonstrated in the right shoulder involving the glenohumeral and acromioclavicular joints. Humeral head demonstrates superior deformity with old ununited ossicles possibly representing old osteochondral fractures with loose bodies or severe degenerative changes or sequela of avascular necrosis. Mild prominence of osteophytes on the humeral head. No acute cortical irregularity or trabecular changes demonstrated to suggest acute fracture. No displacement or dislocation. There is narrowing of the glenohumeral joint with sclerosis on the glenoid surface and subcortical cyst formation. Osteophytes on the glenoid. Degenerative changes in the acromioclavicular joint with subacromial spur formation. Narrowing of the subacromial space suggest chronic rotator cuff arthropathy. Soft tissue calcification inferior to the glenohumeral joint may represent loose body or chondrocalcinosis. No significant effusion. Soft tissues and visualize right lung are unremarkable. IMPRESSION: No evidence of acute fracture or dislocation of the right shoulder. Severe  degenerative changes with old ununited ossicles suggesting chronic process. Electronically Signed   By: Burman Nieves M.D.   On: 01/04/2016 22:46   Dg Knee Complete 4 Views Left  01/04/2016  CLINICAL DATA:  Injury from fall. Patient fell last night and was found on floor today by family. EXAM: LEFT KNEE - COMPLETE 4+ VIEW COMPARISON:  None. FINDINGS: Joint space narrowing and osteophytosis of the lateral compartment. No acute fracture or dislocation. Small suprapatellar joint effusion. IMPRESSION: No fracture.  Small effusion. Osteoarthritis of the lateral compartment Electronically Signed   By: Genevive Bi M.D.   On: 01/04/2016 21:14   Dg Humerus Right  01/04/2016  CLINICAL DATA:  Unwitnessed fall. EXAM: RIGHT HUMERUS - 2+ VIEW COMPARISON:  None. FINDINGS: Irregularity is seen involving right humeral head suggesting possible mildly displaced fracture. No other significant abnormality seen involving the remaining portion of the right humerus. IMPRESSION: Possible mildly displaced right humeral head fracture. CT scan may be performed for further evaluation. Electronically Signed   By: Lupita Raider, M.D.   On: 01/04/2016 21:15    EKG:  Orders placed or performed during the hospital encounter of 01/04/16  . ED EKG  . ED EKG    ASSESSMENT AND PLAN:  Principal Problem:   Acute encephalopathy Active Problems:   Type 2 diabetes mellitus (HCC)   UTI (lower urinary tract infection)   HTN (hypertension)   HLD (hyperlipidemia)   GERD (gastroesophageal reflux disease) #1. Metabolic encephalopathy due to sepsis, urinary tract infection, seems to be resolving, supportive therapy #2. Sepsis due to urinary tract infection, blood cultures were not taken, continue antibiotic therapy with Keflex, awaiting for urinary culture results #3. Acute pyelonephritis, change Rocephin to Keflex due to inability to have IV access, follow culture results and adjust antibiotics depending on cultures #4.  Leukocytosis, improving, follow closely #5. Elevated troponin, suspect demand ischemia, Getting echocardiogram, get cardiology consultation if echocardiogram is abnormal.    Management plans discussed with the patient, family and they are in agreement.   DRUG ALLERGIES:  Allergies  Allergen Reactions  . Penicillins Anaphylaxis, Rash and Other (See Comments)    Has patient had a PCN reaction causing immediate rash, facial/tongue/throat swelling, SOB or lightheadedness with hypotension: Yes Has patient had a PCN reaction causing severe rash involving mucus membranes or skin necrosis: No Has patient had a PCN reaction that required hospitalization No Has patient had a PCN reaction occurring within the last 10 years: No If all of the above answers are "NO", then may proceed with Cephalosporin use.  . Codeine   . Demerol [Meperidine] Rash    CODE STATUS:     Code Status Orders        Start     Ordered   01/05/16 0037  Full code   Continuous     01/05/16 0036    Code Status History    Date Active Date Inactive Code Status Order ID Comments User Context   This patient has a current code status but no historical code status.      TOTAL TIME TAKING CARE OF THIS PATIENT: 35 minutes.  Discussed with patient's daughter, all questions were answered  Armand Preast M.D on 01/05/2016 at 2:06 PM  Between 7am to 6pm - Pager - [phone]  After 6pm go to [email or web address] - password [email or web address] [facility]  [place] [facility]  Office  [phone]  CC: Primary care physician; [facility], Madaline Guthrie, MD

## 2016-01-06 DIAGNOSIS — G9341 Metabolic encephalopathy: Secondary | ICD-10-CM | POA: Diagnosis not present

## 2016-01-06 DIAGNOSIS — E119 Type 2 diabetes mellitus without complications: Secondary | ICD-10-CM | POA: Diagnosis not present

## 2016-01-06 DIAGNOSIS — R6889 Other general symptoms and signs: Secondary | ICD-10-CM | POA: Diagnosis not present

## 2016-01-06 DIAGNOSIS — R3981 Functional urinary incontinence: Secondary | ICD-10-CM | POA: Diagnosis not present

## 2016-01-06 DIAGNOSIS — N390 Urinary tract infection, site not specified: Secondary | ICD-10-CM | POA: Diagnosis not present

## 2016-01-06 DIAGNOSIS — Z741 Need for assistance with personal care: Secondary | ICD-10-CM | POA: Diagnosis not present

## 2016-01-06 DIAGNOSIS — Z743 Need for continuous supervision: Secondary | ICD-10-CM | POA: Diagnosis not present

## 2016-01-06 DIAGNOSIS — R41841 Cognitive communication deficit: Secondary | ICD-10-CM | POA: Diagnosis not present

## 2016-01-06 DIAGNOSIS — R6520 Severe sepsis without septic shock: Secondary | ICD-10-CM | POA: Diagnosis not present

## 2016-01-06 DIAGNOSIS — I10 Essential (primary) hypertension: Secondary | ICD-10-CM | POA: Diagnosis not present

## 2016-01-06 DIAGNOSIS — M545 Low back pain, unspecified: Secondary | ICD-10-CM

## 2016-01-06 DIAGNOSIS — R7989 Other specified abnormal findings of blood chemistry: Secondary | ICD-10-CM

## 2016-01-06 DIAGNOSIS — B9689 Other specified bacterial agents as the cause of diseases classified elsewhere: Secondary | ICD-10-CM | POA: Diagnosis not present

## 2016-01-06 DIAGNOSIS — R2681 Unsteadiness on feet: Secondary | ICD-10-CM | POA: Diagnosis not present

## 2016-01-06 DIAGNOSIS — R4182 Altered mental status, unspecified: Secondary | ICD-10-CM | POA: Diagnosis not present

## 2016-01-06 DIAGNOSIS — N10 Acute tubulo-interstitial nephritis: Secondary | ICD-10-CM | POA: Diagnosis not present

## 2016-01-06 DIAGNOSIS — B9620 Unspecified Escherichia coli [E. coli] as the cause of diseases classified elsewhere: Secondary | ICD-10-CM | POA: Diagnosis not present

## 2016-01-06 DIAGNOSIS — R748 Abnormal levels of other serum enzymes: Secondary | ICD-10-CM | POA: Diagnosis not present

## 2016-01-06 DIAGNOSIS — A419 Sepsis, unspecified organism: Secondary | ICD-10-CM | POA: Diagnosis not present

## 2016-01-06 DIAGNOSIS — D72829 Elevated white blood cell count, unspecified: Secondary | ICD-10-CM

## 2016-01-06 DIAGNOSIS — R778 Other specified abnormalities of plasma proteins: Secondary | ICD-10-CM | POA: Diagnosis not present

## 2016-01-06 DIAGNOSIS — M5136 Other intervertebral disc degeneration, lumbar region: Secondary | ICD-10-CM

## 2016-01-06 DIAGNOSIS — M1990 Unspecified osteoarthritis, unspecified site: Secondary | ICD-10-CM | POA: Diagnosis not present

## 2016-01-06 LAB — GLUCOSE, CAPILLARY
GLUCOSE-CAPILLARY: 193 mg/dL — AB (ref 65–99)
Glucose-Capillary: 266 mg/dL — ABNORMAL HIGH (ref 65–99)

## 2016-01-06 LAB — PROTIME-INR
INR: 1.74
PROTHROMBIN TIME: 20.3 s — AB (ref 11.4–15.0)

## 2016-01-06 LAB — CBC
HCT: 34.6 % — ABNORMAL LOW (ref 35.0–47.0)
Hemoglobin: 11.5 g/dL — ABNORMAL LOW (ref 12.0–16.0)
MCH: 29.2 pg (ref 26.0–34.0)
MCHC: 33.3 g/dL (ref 32.0–36.0)
MCV: 87.8 fL (ref 80.0–100.0)
PLATELETS: 151 10*3/uL (ref 150–440)
RBC: 3.94 MIL/uL (ref 3.80–5.20)
RDW: 14.4 % (ref 11.5–14.5)
WBC: 11.2 10*3/uL — AB (ref 3.6–11.0)

## 2016-01-06 MED ORDER — GLUCERNA SHAKE PO LIQD
237.0000 mL | Freq: Three times a day (TID) | ORAL | Status: DC
Start: 2016-01-06 — End: 2017-05-02

## 2016-01-06 MED ORDER — LIDOCAINE 5 % EX PTCH
1.0000 | MEDICATED_PATCH | CUTANEOUS | Status: DC
  Administered 2016-01-06: 1 via TRANSDERMAL
  Filled 2016-01-06: qty 1

## 2016-01-06 MED ORDER — LIDOCAINE 5 % EX PTCH: 1.0000 | MEDICATED_PATCH | CUTANEOUS | Status: AC

## 2016-01-06 MED ORDER — CEPHALEXIN 250 MG/5ML PO SUSR: 250.0000 mg | Freq: Three times a day (TID) | ORAL | Status: DC

## 2016-01-06 MED ORDER — ACETAMINOPHEN 325 MG PO TABS: 650.0000 mg | ORAL_TABLET | Freq: Four times a day (QID) | ORAL | Status: DC | PRN

## 2016-01-06 MED ORDER — ONDANSETRON HCL 4 MG PO TABS: 4.0000 mg | ORAL_TABLET | Freq: Four times a day (QID) | ORAL | Status: DC | PRN

## 2016-01-06 NOTE — Discharge Summary (Signed)
[facility] - [place] at [facility]   PATIENT NAME: Natalie Rogers    MR#:  [id number]  DATE OF BIRTH:  [birth year]  DATE OF ADMISSION:  01/04/2016 ADMITTING PHYSICIAN: Oralia Manis, MD  DATE OF DISCHARGE: No discharge date for patient encounter.  PRIMARY CARE PHYSICIAN: FELDPAUSCH, DALE E, MD     ADMISSION DIAGNOSIS:  UTI (lower urinary tract infection) [N39.0] Humerus fracture [S42.309A] Hyperglycemia [R73.9] Altered mental status, unspecified altered mental status type [R41.82]  DISCHARGE DIAGNOSIS:  Principal Problem:   Acute encephalopathy Active Problems:   Acute pyelonephritis   Lower back pain   DDD (degenerative disc disease), lumbar   Elevated troponin   Leukocytosis   Type 2 diabetes mellitus (HCC)   HTN (hypertension)   HLD (hyperlipidemia)   GERD (gastroesophageal reflux disease)   SECONDARY DIAGNOSIS:   Past Medical History  Diagnosis Date  . HTN (hypertension)   . HLD (hyperlipidemia)   . COPD (chronic obstructive pulmonary disease) (HCC)   . GERD (gastroesophageal reflux disease)   . Type 2 diabetes mellitus (HCC)   . Sleep apnea   . DVT (deep venous thrombosis) (HCC)   . PE (pulmonary embolism)     on chronic anticoagulation    .pro HOSPITAL COURSE:  Patient is a 82 year old Caucasian female with medical history significant for history of hypertension, hyperlipidemia, COPD, gastroesophageal reflux disease, diabetes mellitus, obstructive sleep apnea who presents to the hospital with complaints of confusion and fall at home. Patient was complaining of some dysuria symptoms for the past one week, right flank pain. On arrival to the hospital patient was noted to have pyuria, concerning for urinary tract infection. Her labs revealed elevated glucose level and mild elevation of troponin on the first set, but does not subsequently, also significant leukocytosis of 14.5 thousand. Patient was admitted to the hospital for further  evaluation and treatment.  Urine cultures revealed more than 100,000 colony-forming units of gram-negative rods, identification and sensitivities are pending. Patient was evaluated by physical therapist and recommended skilled nursing facility placement for rehabilitation. Multiple radiologic studies done on admission failed to reveal any fractures in the body, however, patient complained of lumbar spine pain with movements. Discussion by problem: #1. Metabolic encephalopathy due to sepsis, urinary tract infection, resolved with supportive therapy. The patient was evaluated by physical therapist recommended skilled nursing facility placement for rehabilitation where she will be discharged today. #2. Sepsis due to urinary tract infection, blood cultures were not taken, continue antibiotic therapy with Keflex for 6 more days to complete course, while the cell count is decreasing with current therapy from 14.5 on admission to 11.2 on the day of discharge, signifying treatment of infection. Continue Keflex, observing closely urinary culture results, adjust antibiotics depending on culture results/bacterial sensitivities .  #3. Acute pyelonephritis due to gram-negative rods, identification and sensitivity is still pending, however, patient's white blood cell count is improving, continue Keflex orally for 6 more days to complete course,  follow culture results  closely and adjust antibiotics depending on cultures #4. Leukocytosis,, resolving, follow closely #5. Elevated troponin due to  demand ischemia,, echocardiogram was observed, unremarkable, normal ejection fraction, no regional wall motion abnormalities, mild aortic and mitral valve regurgitation, no further interventions were recommended.   #6. Low back pain, no abnormalities were seen on Lumbar spine x-ray, initiating Lidoderm patch, pain medications, patient may benefit from pain clinic evaluation if pain is relentless.  DISCHARGE CONDITIONS:   Stable    CONSULTS OBTAINED:  DRUG ALLERGIES:   Allergies  Allergen Reactions  . Penicillins Anaphylaxis, Rash and Other (See Comments)    Has patient had a PCN reaction causing immediate rash, facial/tongue/throat swelling, SOB or lightheadedness with hypotension: Yes Has patient had a PCN reaction causing severe rash involving mucus membranes or skin necrosis: No Has patient had a PCN reaction that required hospitalization No Has patient had a PCN reaction occurring within the last 10 years: No If all of the above answers are "NO", then may proceed with Cephalosporin use.  . Codeine   . Demerol [Meperidine] Rash    DISCHARGE MEDICATIONS:   Current Discharge Medication List    START taking these medications   Details  cephALEXin (KEFLEX) 250 MG/5ML suspension Take 5 mLs (250 mg total) by mouth every 8 (eight) hours. Qty: 100 mL, Refills: 0    feeding supplement, GLUCERNA SHAKE, (GLUCERNA SHAKE) LIQD Take 237 mLs by mouth 3 (three) times daily between meals. Qty: 90 Can, Refills: 5    lidocaine (LIDODERM) 5 % Place 1 patch onto the skin daily. Remove & Discard patch within 12 hours or as directed by MD Qty: 30 patch, Refills: 0    ondansetron (ZOFRAN) 4 MG tablet Take 1 tablet (4 mg total) by mouth every 6 (six) hours as needed for nausea. Qty: 20 tablet, Refills: 0      CONTINUE these medications which have NOT CHANGED   Details  acetaminophen (TYLENOL) 500 MG tablet Take 500 mg by mouth every 6 (six) hours as needed for mild pain or headache.    glipiZIDE (GLUCOTROL XL) 10 MG 24 hr tablet Take 10 mg by mouth 2 (two) times daily.    lisinopril (PRINIVIL,ZESTRIL) 40 MG tablet Take 40 mg by mouth daily.    metFORMIN (GLUCOPHAGE-XR) 500 MG 24 hr tablet Take 500 mg by mouth 2 (two) times daily.    metoprolol succinate (TOPROL-XL) 25 MG 24 hr tablet Take 25 mg by mouth daily.    pantoprazole (PROTONIX) 40 MG tablet Take 40 mg by mouth daily.    pravastatin (PRAVACHOL) 40 MG  tablet Take 40 mg by mouth at bedtime.    traZODone (DESYREL) 50 MG tablet Take 50 mg by mouth at bedtime.    vitamin B-12 (CYANOCOBALAMIN) 1000 MCG tablet Take 1,000 mcg by mouth daily.    !! warfarin (COUMADIN) 2.5 MG tablet Take 2.5 mg by mouth at bedtime. Pt takes this dose on Monday and Thursday.    !! warfarin (COUMADIN) 5 MG tablet Take 5 mg by mouth at bedtime. Pt takes this dose on Saturday, Tuesday, Wednesday, Friday, and Saturday.     !! - Potential duplicate medications found. Please discuss with provider.       DISCHARGE INSTRUCTIONS:    Patient is to follow-up with primary care physician as outpatient, she may benefit from orthopedic surgery or pain clinic evaluation for her degenerative disc disease, lumbar, sacral pains   If you experience worsening of your admission symptoms, develop shortness of breath, life threatening emergency, suicidal or homicidal thoughts you must seek medical attention immediately by calling 911 or calling your MD immediately  if symptoms less severe.  You Must read complete instructions/literature along with all the possible adverse reactions/side effects for all the Medicines you take and that have been prescribed to you. Take any new Medicines after you have completely understood and accept all the possible adverse reactions/side effects.   Please note  You were cared for by a hospitalist during your hospital stay.  If you have any questions about your discharge medications or the care you received while you were in the hospital after you are discharged, you can call the unit and asked to speak with the hospitalist on call if the hospitalist that took care of you is not available. Once you are discharged, your primary care physician will handle any further medical issues. Please note that NO REFILLS for any discharge medications will be authorized once you are discharged, as it is imperative that you return to your primary care physician (or  establish a relationship with a primary care physician if you do not have one) for your aftercare needs so that they can reassess your need for medications and monitor your lab values.    Today   CHIEF COMPLAINT:   Chief Complaint  Patient presents with  . Fall    HISTORY OF PRESENT ILLNESS:  Liel Rudden  is a 82 y.o. female with a known history of hypertension, hyperlipidemia, COPD, gastroesophageal reflux disease, diabetes mellitus, obstructive sleep apnea who presents to the hospital with complaints of confusion and fall at home. Patient was complaining of some dysuria symptoms for the past one week, right flank pain. On arrival to the hospital patient was noted to have pyuria, concerning for urinary tract infection. Her labs revealed elevated glucose level and mild elevation of troponin on the first set, but does not subsequently, also significant leukocytosis of 14.5 thousand. Patient was admitted to the hospital for further evaluation and treatment.  Urine cultures revealed more than 100,000 colony-forming units of gram-negative rods, identification and sensitivities are pending. Patient was evaluated by physical therapist and recommended skilled nursing facility placement for rehabilitation. Multiple radiologic studies done on admission failed to reveal any fractures in the body, however, patient complained of lumbar spine pain with movements. Discussion by problem: #1. Metabolic encephalopathy due to sepsis, urinary tract infection, resolved with supportive therapy. The patient was evaluated by physical therapist recommended skilled nursing facility placement for rehabilitation where she will be discharged today. #2. Sepsis due to urinary tract infection, blood cultures were not taken, continue antibiotic therapy with Keflex for 6 more days to complete course, while the cell count is decreasing with current therapy from 14.5 on admission to 11.2 on the day of discharge, signifying  treatment of infection. Continue Keflex, observing closely urinary culture results, adjust antibiotics depending on culture results/bacterial sensitivities .  #3. Acute pyelonephritis due to gram-negative rods, identification and sensitivity is still pending, however, patient's white blood cell count is improving, continue Keflex orally for 6 more days to complete course,  follow culture results  closely and adjust antibiotics depending on cultures #4. Leukocytosis,, resolving, follow closely #5. Elevated troponin due to  demand ischemia,, echocardiogram was observed, unremarkable, normal ejection fraction, no regional wall motion abnormalities, mild aortic and mitral valve regurgitation, no further interventions were recommended.   #6. Low back pain, no abnormalities were seen on Lumbar spine x-ray, initiating Lidoderm patch, pain medications, patient may benefit from pain clinic evaluation if pain is relentless.     VITAL SIGNS:  Blood pressure 148/62, pulse 73, temperature 98.4 F (36.9 C), temperature source Oral, resp. rate 16, height 5\' 5"  (1.651 m), weight 77.52 kg (170 lb 14.4 oz), SpO2 96 %.  I/O:   Intake/Output Summary (Last 24 hours) at 01/06/16 0939 Last data filed at 01/05/16 1900  Gross per 24 hour  Intake   1797 ml  Output      0 ml  Net  1797 ml    PHYSICAL EXAMINATION:  GENERAL:  82 y.o.-year-old patient lying in the bed with no acute distress.  EYES: Pupils equal, round, reactive to light and accommodation. No scleral icterus. Extraocular muscles intact.  HEENT: Head atraumatic, normocephalic. Oropharynx and nasopharynx clear.  NECK:  Supple, no jugular venous distention. No thyroid enlargement, no tenderness.  LUNGS: Normal breath sounds bilaterally, no wheezing, rales,rhonchi or crepitation. No use of accessory muscles of respiration.  CARDIOVASCULAR: S1, S2 normal. No murmurs, rubs, or gallops.  ABDOMEN: Soft, non-tender, non-distended. Bowel sounds present. No  organomegaly or mass.  EXTREMITIES: No pedal edema, cyanosis, or clubbing. Discomfort on palpation in lumbosacral spine, mild discomfort to right CVA percussion  NEUROLOGIC: Cranial nerves II through XII are intact. Muscle strength 5/5 in all extremities. Sensation intact. Gait not checked.  PSYCHIATRIC: The patient is alert and oriented x 3.  SKIN: No obvious rash, lesion, or ulcer.   DATA REVIEW:   CBC  Recent Labs Lab 01/06/16 0441  WBC 11.2*  HGB 11.5*  HCT 34.6*  PLT 151    Chemistries   Recent Labs Lab 01/05/16 0640  NA 135  K 3.5  CL 99*  CO2 27  GLUCOSE 205*  BUN 18  CREATININE 0.76  CALCIUM 8.4*    Cardiac Enzymes  Recent Labs Lab 01/05/16 1224  TROPONINI 0.03    Microbiology Results  Results for orders placed or performed during the hospital encounter of 01/04/16  Urine culture     Status: Abnormal (Preliminary result)   Collection Time: 01/04/16  7:51 PM  Result Value Ref Range Status   Specimen Description URINE, RANDOM  Final   Special Requests NONE  Final   Culture (A)  Final    >=100,000 COLONIES/mL GRAM NEGATIVE RODS IDENTIFICATION AND SUSCEPTIBILITIES TO FOLLOW    Report Status PENDING  Incomplete    RADIOLOGY:  Dg Chest 1 View  01/04/2016  CLINICAL DATA:  Injury from fall. Patient fell last night and was found on floor today by family. EXAM: CHEST 1 VIEW COMPARISON:  None. FINDINGS: Cardiac silhouette. No pulmonary contusion or pleural fluid. No pneumothorax. No evidence fracture. IMPRESSION: No evidence of thoracic trauma. Electronically Signed   By: Genevive Bi M.D.   On: 01/04/2016 21:13   Dg Thoracic Spine 2 View  01/04/2016  CLINICAL DATA:  Fall EXAM: THORACIC SPINE 2 VIEWS COMPARISON:  None. FINDINGS: Normal alignment of the thoracic vertebral bodies. No loss of vertebral body height or disc height. No subluxation. Normal paraspinal lines. IMPRESSION: No evidence of thoracic spine fracture. Electronically Signed   By: Genevive Bi M.D.   On: 01/04/2016 21:17   Dg Lumbar Spine Complete  01/04/2016  CLINICAL DATA:  Unwitnessed fall. EXAM: LUMBAR SPINE - COMPLETE 4+ VIEW COMPARISON:  None. FINDINGS: Diffuse osteopenia is noted. Atherosclerosis of abdominal aorta is noted. No fracture or spondylolisthesis is noted. Mild degenerative disc disease is noted at T12-L1. Posterior facet joints are unremarkable. IMPRESSION: No acute abnormality seen in the lumbar spine. Atherosclerosis of abdominal aorta is noted. Electronically Signed   By: Lupita Raider, M.D.   On: 01/04/2016 21:11   Dg Pelvis 1-2 Views  01/04/2016  CLINICAL DATA:  Injury from fall. Patient fell last night and was found on floor today by family. EXAM: PELVIS - 1-2 VIEW COMPARISON:  None FINDINGS: Hips are located.  No pelvic fracture or sacral fracture. IMPRESSION: No evidence of pelvic fracture or hip fracture on single view Electronically Signed  By: Genevive Bi M.D.   On: 01/04/2016 21:15   Dg Shoulder Right  01/04/2016  CLINICAL DATA:  Injury from fall EXAM: RIGHT SHOULDER - 2+ VIEW COMPARISON:  None. FINDINGS: Glenohumeral joint is intact. No evidence of scapular fracture or humeral fracture. The acromioclavicular joint is intact. Large loose body inferior to the joint. Degenerate changes of the humeral head. IMPRESSION: 1. No evidence acute fracture or dislocation. 2. Chronic degenerative changes. Electronically Signed   By: Genevive Bi M.D.   On: 01/04/2016 21:16   Ct Head Wo Contrast  01/04/2016  CLINICAL DATA:  Unwitnessed fall. EXAM: CT HEAD WITHOUT CONTRAST CT CERVICAL SPINE WITHOUT CONTRAST TECHNIQUE: Multidetector CT imaging of the head and cervical spine was performed following the standard protocol without intravenous contrast. Multiplanar CT image reconstructions of the cervical spine were also generated. COMPARISON:  None. FINDINGS: CT HEAD FINDINGS Bony calvarium appears intact. Mild diffuse cortical atrophy is noted. Mild chronic  ischemic white matter disease is noted. No mass effect or midline shift is noted. Ventricular size is within normal limits. There is no evidence of mass lesion, hemorrhage or acute infarction. CT CERVICAL SPINE FINDINGS No fracture or spondylolisthesis is noted. Moderate multilevel degenerative disc disease is noted extending from C3-4 to C6-7. Visualized lung apices appear normal. IMPRESSION: Mild diffuse cortical atrophy. Mild chronic ischemic white matter disease. No acute intracranial abnormality seen. Moderate multilevel degenerative disc disease. No acute abnormality seen in the cervical spine. Electronically Signed   By: Lupita Raider, M.D.   On: 01/04/2016 20:52   Ct Cervical Spine Wo Contrast  01/04/2016  CLINICAL DATA:  Unwitnessed fall. EXAM: CT HEAD WITHOUT CONTRAST CT CERVICAL SPINE WITHOUT CONTRAST TECHNIQUE: Multidetector CT imaging of the head and cervical spine was performed following the standard protocol without intravenous contrast. Multiplanar CT image reconstructions of the cervical spine were also generated. COMPARISON:  None. FINDINGS: CT HEAD FINDINGS Bony calvarium appears intact. Mild diffuse cortical atrophy is noted. Mild chronic ischemic white matter disease is noted. No mass effect or midline shift is noted. Ventricular size is within normal limits. There is no evidence of mass lesion, hemorrhage or acute infarction. CT CERVICAL SPINE FINDINGS No fracture or spondylolisthesis is noted. Moderate multilevel degenerative disc disease is noted extending from C3-4 to C6-7. Visualized lung apices appear normal. IMPRESSION: Mild diffuse cortical atrophy. Mild chronic ischemic white matter disease. No acute intracranial abnormality seen. Moderate multilevel degenerative disc disease. No acute abnormality seen in the cervical spine. Electronically Signed   By: Lupita Raider, M.D.   On: 01/04/2016 20:52   Ct Shoulder Right Wo Contrast  01/04/2016  CLINICAL DATA:  Patient fell last  night and was found on the floor today. History of dementia. Right shoulder pain. EXAM: CT OF THE RIGHT SHOULDER WITHOUT CONTRAST TECHNIQUE: Multidetector CT imaging was performed according to the standard protocol. Multiplanar CT image reconstructions were also generated. COMPARISON:  Right shoulder 01/04/2016 FINDINGS: Prominent degenerative changes demonstrated in the right shoulder involving the glenohumeral and acromioclavicular joints. Humeral head demonstrates superior deformity with old ununited ossicles possibly representing old osteochondral fractures with loose bodies or severe degenerative changes or sequela of avascular necrosis. Mild prominence of osteophytes on the humeral head. No acute cortical irregularity or trabecular changes demonstrated to suggest acute fracture. No displacement or dislocation. There is narrowing of the glenohumeral joint with sclerosis on the glenoid surface and subcortical cyst formation. Osteophytes on the glenoid. Degenerative changes in the acromioclavicular joint with subacromial spur formation.  Narrowing of the subacromial space suggest chronic rotator cuff arthropathy. Soft tissue calcification inferior to the glenohumeral joint may represent loose body or chondrocalcinosis. No significant effusion. Soft tissues and visualize right lung are unremarkable. IMPRESSION: No evidence of acute fracture or dislocation of the right shoulder. Severe degenerative changes with old ununited ossicles suggesting chronic process. Electronically Signed   By: Burman NievesWilliam  Stevens M.D.   On: 01/04/2016 22:46   Dg Knee Complete 4 Views Left  01/04/2016  CLINICAL DATA:  Injury from fall. Patient fell last night and was found on floor today by family. EXAM: LEFT KNEE - COMPLETE 4+ VIEW COMPARISON:  None. FINDINGS: Joint space narrowing and osteophytosis of the lateral compartment. No acute fracture or dislocation. Small suprapatellar joint effusion. IMPRESSION: No fracture.  Small effusion.  Osteoarthritis of the lateral compartment Electronically Signed   By: Genevive BiStewart  Edmunds M.D.   On: 01/04/2016 21:14   Dg Humerus Right  01/04/2016  CLINICAL DATA:  Unwitnessed fall. EXAM: RIGHT HUMERUS - 2+ VIEW COMPARISON:  None. FINDINGS: Irregularity is seen involving right humeral head suggesting possible mildly displaced fracture. No other significant abnormality seen involving the remaining portion of the right humerus. IMPRESSION: Possible mildly displaced right humeral head fracture. CT scan may be performed for further evaluation. Electronically Signed   By: Lupita RaiderJames  Green Jr, M.D.   On: 01/04/2016 21:15    EKG:   Orders placed or performed during the hospital encounter of 01/04/16  . ED EKG  . ED EKG      Management plans discussed with the patient, family and they are in agreement.  CODE STATUS:     Code Status Orders        Start     Ordered   01/05/16 0037  Full code   Continuous     01/05/16 0036    Code Status History    Date Active Date Inactive Code Status Order ID Comments User Context   This patient has a current code status but no historical code status.      TOTAL TIME TAKING CARE OF THIS PATIENT: 40 minutes.  Discussed with patient's daughter and care management, all questions were answered   Ashaz Robling M.D on 01/06/2016 at 9:39 AM  Between 7am to 6pm - Pager - [phone]  After 6pm go to [email or web address] - password [email or web address] [facility]  [facility]  Office  [phone]  CC: Primary care physician; Gulfshore Endoscopy IncFELDPAUSCH, Madaline GuthrieALE E, MD

## 2016-01-06 NOTE — Progress Notes (Signed)
ANTICOAGULATION CONSULT NOTE - Follow Up Consult  Pharmacy Consult for warfarin dosing Indication: VTE prophylaxis  Allergies  Allergen Reactions  . Penicillins Anaphylaxis, Rash and Other (See Comments)    Has patient had a PCN reaction causing immediate rash, facial/tongue/throat swelling, SOB or lightheadedness with hypotension: Yes Has patient had a PCN reaction causing severe rash involving mucus membranes or skin necrosis: No Has patient had a PCN reaction that required hospitalization No Has patient had a PCN reaction occurring within the last 10 years: No If all of the above answers are "NO", then may proceed with Cephalosporin use.  . Codeine   . Demerol [Meperidine] Rash    Patient Measurements: Height: 5\' 5"  (165.1 cm) Weight: 170 lb 14.4 oz (77.52 kg) IBW/kg (Calculated) : 57 Heparin Dosing Weight: n/a  Vital Signs: Temp: 98.6 F (37 C) (05/17 0347) Temp Source: Oral (05/17 0347) BP: 135/50 mmHg (05/17 0348) Pulse Rate: 74 (05/17 0348)  Labs:  Recent Labs  01/04/16 1951 01/05/16 0104 01/05/16 0640 01/05/16 1224 01/06/16 0441  HGB 13.3  --  12.0  --  11.5*  HCT 39.3  --  35.5  --  34.6*  PLT 181  --  163  --  151  LABPROT 19.5*  --   --   --  20.3*  INR 1.65  --   --   --  1.74  CREATININE 0.72  --  0.76  --   --   CKTOTAL 434*  --   --   --   --   TROPONINI 0.04* 0.04* <0.03 0.03  --     Estimated Creatinine Clearance: 49.1 mL/min (by C-G formula based on Cr of 0.76).   Medical History: Past Medical History  Diagnosis Date  . HTN (hypertension)   . HLD (hyperlipidemia)   . COPD (chronic obstructive pulmonary disease) (HCC)   . GERD (gastroesophageal reflux disease)   . Type 2 diabetes mellitus (HCC)   . Sleep apnea   . DVT (deep venous thrombosis) (HCC)   . PE (pulmonary embolism)     on chronic anticoagulation    Medications:    Assessment: Pharmacy consulted to dose warfarin in an 82 yo female with history of DVT/PE and on chronic  anticoagulation. Per med rec, patient takes warfarin 2.5 mg on Mondays and Thursdays and 5 mg all other days of the week. PTA med list indicates that patient took last dose of warfarin 5 mg on 5/13 and 2.5 mg on 5/11.  Unsure if patient received a dose on 5/14.   5/16: INR 1.65 - warfarin 5 mg  5/17: INR 1.74  Goal of Therapy:  INR 2-3    Plan:  Home dose of warfarin 2.5 mg on Mondays and Thursdays and 5 mg all other days of the week is currently ordered.  Will continue for now as INR is trending up and patient is due to receive a 5mg  dose. It is not clear if she received a dose of warfarin on 5/14. If INR is <2 tomorrow, may consider ordering a 5 mg dose instead of the 2.5 mg.   Pharmacy will continue to follow.   Naryah Clenney G 01/06/2016,7:38 AM

## 2016-01-06 NOTE — Progress Notes (Signed)
[facility] [place] [facility] at [phone]. EMS has been contacted to transport patient to facility.  Harvie HeckMelanie Zeppelin Commisso, RN

## 2016-01-06 NOTE — Progress Notes (Signed)
Clinical Child psychotherapistocial Worker (CSW) presented bed offers to patient's daughter Alona BeneJoyce. Daughter chose [facility]. [facility] aware of above. [facility] coordinator [facility] is aware of accepted bed offer.   Jetta LoutBailey Morgan, LCSW [phone]

## 2016-01-06 NOTE — Progress Notes (Signed)
Patient is medically stable for D/C to [facility] today. [facility]. Auth # [id number]. Per [facility] admissions [facility] patient will go to room 4-A. RN will call report and arrange EMS for transport. Clinical Child psychotherapistocial Worker (CSW) sent D/C Summary, FL2 and D/C Packet to [facility] via [facility]. Patient is aware of above. Patient's daughter Alona BeneJoyce is at bedside and aware of D/C today. Please reconsult if future social work needs arise. CSW signing off.   Jetta LoutBailey Morgan, LCSW [phone]

## 2016-01-06 NOTE — Clinical Social Work Placement (Signed)
   CLINICAL SOCIAL WORK PLACEMENT  NOTE  Date:  01/06/2016  Patient Details  Name: Natalie Rogers MRN: [id number] Date of Birth: 1923-11-18  Clinical Social Work is seeking post-discharge placement for this patient at the Skilled  Nursing Facility level of care (*CSW will initial, date and re-position this form in  chart as items are completed):  Yes   Patient/family provided with [facility] Clinical Social Work Department's list of facilities offering this level of care within the geographic area requested by the patient (or if unable, by the patient's family).  Yes   Patient/family informed of their freedom to choose among providers that offer the needed level of care, that participate in Medicare, Medicaid or managed care program needed by the patient, have an available bed and are willing to accept the patient.  Yes   Patient/family informed of [facility]'s ownership interest in [facility] and [facility], as well as of the fact that they are under no obligation to receive care at these facilities.  PASRR submitted to EDS on 01/05/16     PASRR number received on 01/05/16     Existing PASRR number confirmed on       FL2 transmitted to all facilities in geographic area requested by pt/family on 01/05/16     FL2 transmitted to all facilities within larger geographic area on       Patient informed that his/her managed care company has contracts with or will negotiate with certain facilities, including the following:        Yes   Patient/family informed of bed offers received.  Patient chooses bed at  [facility] )     Physician recommends and patient chooses bed at      Patient to be transferred to  [facility] ) on 01/06/16.  Patient to be transferred to facility by  [facility] )     Patient family notified on 01/06/16 of transfer.  Name of family member notified:   (Patient's daughter Alona BeneJoyce is at bedside and aware of D/C today. )      PHYSICIAN       Additional Comment:    _______________________________________________ Haig ProphetMorgan, Cenia Zaragosa G, LCSW 01/06/2016, 11:15 AM

## 2016-01-07 LAB — URINE CULTURE

## 2016-01-08 DIAGNOSIS — M1990 Unspecified osteoarthritis, unspecified site: Secondary | ICD-10-CM | POA: Diagnosis not present

## 2016-01-08 DIAGNOSIS — A419 Sepsis, unspecified organism: Secondary | ICD-10-CM | POA: Diagnosis not present

## 2016-01-08 DIAGNOSIS — I10 Essential (primary) hypertension: Secondary | ICD-10-CM | POA: Diagnosis not present

## 2016-01-08 DIAGNOSIS — N390 Urinary tract infection, site not specified: Secondary | ICD-10-CM | POA: Diagnosis not present

## 2016-01-15 NOTE — Progress Notes (Signed)
01/05/16 1240  PT Visit Information  Last PT Received On 01/05/16  Assistance Needed +1  History of Present Illness Pt is admitted for acute encephalopathy and UTI. Pt with complaints of fall and confusion. Pt with history of HTN, COPD, GERD, DM, DVT, and PE.   Precautions  Precautions Fall  Restrictions  Weight Bearing Restrictions No  Home Living  Family/patient expects to be discharged to: Private residence  Living Arrangements Alone  Available Help at Discharge (has children able to check on her)  Type of Home House  Home Access Level entry  Home Layout One level  Home Equipment Walker - 2 wheels  Prior Function  Level of Independence Independent with assistive device(s)  Comments uses RW for all mobility  Communication  Communication No difficulties  Pain Assessment  Pain Assessment 0-10  Pain Score 8  Pain Location low back  Pain Descriptors / Indicators Aching;Dull  Pain Intervention(s) Limited activity within patient's tolerance  Cognition  Arousal/Alertness Awake/alert  Behavior During Therapy WFL for tasks assessed/performed  Overall Cognitive Status Within Functional Limits for tasks assessed  Upper Extremity Assessment  Upper Extremity Assessment Generalized weakness (B UE grossly 3/5)  Lower Extremity Assessment  Lower Extremity Assessment Generalized weakness (B LE grossly 3-/5)  Bed Mobility  Overal bed mobility Needs Assistance  Bed Mobility Supine to Sit  Supine to sit Mod assist  General bed mobility comments assist for bed mobility with assist for initiation. Once seated at EOB, pt able to sit with supervision.  Transfers  Overall transfer level Needs assistance  Equipment used Rolling walker (2 wheeled)  Transfers Sit to/from Stand  Sit to Stand Min assist  General transfer comment transfers performed with min assist. 2 attempts for upright posture. Cues for pushing from seated surface instead of pulling up on rw.  Ambulation/Gait   Ambulation/Gait assistance Min assist  Ambulation Distance (Feet) 3 Feet  Assistive device Rolling walker (2 wheeled)  Gait Pattern/deviations Step-through pattern  General Gait Details ambulated using rw and min assist. Step to gait pattern performed with pt increased sway with dynamic balance. Pt fatigues quickly with increased distance  Balance  Overall balance assessment Needs assistance;History of Falls  Sitting-balance support Feet supported  Sitting balance-Leahy Scale Fair  Standing balance support Bilateral upper extremity supported  Standing balance-Leahy Scale Fair  Exercises  Exercises Other exercises  Other Exercises  Other Exercises Pt performed supine/seated ther-ex including ankle pumps, quad sets, elbow flexion/extension, and LAQ. All ther-ex performed x 10 reps with min assist for correct technique  PT - End of Session  Equipment Utilized During Treatment Gait belt  Activity Tolerance Patient tolerated treatment well  Patient left in chair;with chair alarm set  Nurse Communication Mobility status  PT Assessment  PT Therapy Diagnosis  Difficulty walking;Generalized weakness  PT Recommendation/Assessment Patient needs continued PT services  PT Problem List Decreased strength;Decreased balance;Decreased mobility  PT Plan  PT Frequency (ACUTE ONLY) Min 2X/week  PT Treatment/Interventions (ACUTE ONLY) DME instruction;Gait training;Therapeutic exercise  PT Recommendation  Follow Up Recommendations SNF  Individuals Consulted  Consulted and Agree with Results and Recommendations Patient  Acute Rehab PT Goals  Patient Stated Goal to get stronger  PT Goal Formulation With patient  Time For Goal Achievement 01/19/16  Potential to Achieve Goals Good  PT Time Calculation  PT Start Time (ACUTE ONLY) 0919  PT Stop Time (ACUTE ONLY) 0937  PT Time Calculation (min) (ACUTE ONLY) 18 min  PT G-Codes **NOT FOR INPATIENT CLASS**  Functional  Assessment Tool Used clinical  judgement  Functional Limitation Mobility: Walking and moving around  Mobility: Walking and Moving Around Current Status [phone](G8978) CK  Mobility: Walking and Moving Around Goal Status (U0454(G8979) CJ  PT General Charges  $$ ACUTE PT VISIT 1 Procedure  PT Evaluation  $PT Eval Moderate Complexity 1 Procedure  PT Treatments  $Therapeutic Exercise 8-22 mins  Late G code entry Elizabeth PalauStephanie Tonae Livolsi, PT, DPT [phone]

## 2016-02-01 DIAGNOSIS — G4700 Insomnia, unspecified: Secondary | ICD-10-CM | POA: Diagnosis not present

## 2016-02-01 DIAGNOSIS — F339 Major depressive disorder, recurrent, unspecified: Secondary | ICD-10-CM | POA: Diagnosis not present

## 2016-02-01 DIAGNOSIS — F419 Anxiety disorder, unspecified: Secondary | ICD-10-CM | POA: Diagnosis not present

## 2016-02-01 DIAGNOSIS — Z008 Encounter for other general examination: Secondary | ICD-10-CM | POA: Diagnosis not present

## 2016-02-01 DIAGNOSIS — I10 Essential (primary) hypertension: Secondary | ICD-10-CM | POA: Diagnosis not present

## 2016-02-01 DIAGNOSIS — G3184 Mild cognitive impairment, so stated: Secondary | ICD-10-CM | POA: Diagnosis not present

## 2016-02-01 DIAGNOSIS — E119 Type 2 diabetes mellitus without complications: Secondary | ICD-10-CM | POA: Diagnosis not present

## 2016-02-19 DIAGNOSIS — G9340 Encephalopathy, unspecified: Secondary | ICD-10-CM | POA: Diagnosis not present

## 2016-02-19 DIAGNOSIS — I10 Essential (primary) hypertension: Secondary | ICD-10-CM | POA: Diagnosis not present

## 2016-02-19 DIAGNOSIS — Z7901 Long term (current) use of anticoagulants: Secondary | ICD-10-CM | POA: Diagnosis not present

## 2016-02-19 DIAGNOSIS — I2782 Chronic pulmonary embolism: Secondary | ICD-10-CM | POA: Diagnosis not present

## 2016-02-19 DIAGNOSIS — J449 Chronic obstructive pulmonary disease, unspecified: Secondary | ICD-10-CM | POA: Diagnosis not present

## 2016-02-19 DIAGNOSIS — R41841 Cognitive communication deficit: Secondary | ICD-10-CM | POA: Diagnosis not present

## 2016-02-19 DIAGNOSIS — F338 Other recurrent depressive disorders: Secondary | ICD-10-CM | POA: Diagnosis not present

## 2016-02-19 DIAGNOSIS — E785 Hyperlipidemia, unspecified: Secondary | ICD-10-CM | POA: Diagnosis not present

## 2016-02-19 DIAGNOSIS — K219 Gastro-esophageal reflux disease without esophagitis: Secondary | ICD-10-CM | POA: Diagnosis not present

## 2016-02-19 DIAGNOSIS — E1165 Type 2 diabetes mellitus with hyperglycemia: Secondary | ICD-10-CM | POA: Diagnosis not present

## 2016-02-26 DIAGNOSIS — E785 Hyperlipidemia, unspecified: Secondary | ICD-10-CM | POA: Diagnosis not present

## 2016-02-26 DIAGNOSIS — Z7901 Long term (current) use of anticoagulants: Secondary | ICD-10-CM | POA: Diagnosis not present

## 2016-02-26 DIAGNOSIS — I10 Essential (primary) hypertension: Secondary | ICD-10-CM | POA: Diagnosis not present

## 2016-02-26 DIAGNOSIS — R21 Rash and other nonspecific skin eruption: Secondary | ICD-10-CM | POA: Diagnosis not present

## 2016-02-26 DIAGNOSIS — E1165 Type 2 diabetes mellitus with hyperglycemia: Secondary | ICD-10-CM | POA: Diagnosis not present

## 2016-02-26 DIAGNOSIS — R2242 Localized swelling, mass and lump, left lower limb: Secondary | ICD-10-CM | POA: Diagnosis not present

## 2016-02-26 DIAGNOSIS — M79604 Pain in right leg: Secondary | ICD-10-CM | POA: Diagnosis not present

## 2016-02-26 DIAGNOSIS — R600 Localized edema: Secondary | ICD-10-CM | POA: Diagnosis not present

## 2016-03-04 DIAGNOSIS — L0390 Cellulitis, unspecified: Secondary | ICD-10-CM | POA: Diagnosis not present

## 2016-03-04 DIAGNOSIS — M545 Low back pain: Secondary | ICD-10-CM | POA: Diagnosis not present

## 2016-03-04 DIAGNOSIS — I2782 Chronic pulmonary embolism: Secondary | ICD-10-CM | POA: Diagnosis not present

## 2016-03-15 DIAGNOSIS — E1165 Type 2 diabetes mellitus with hyperglycemia: Secondary | ICD-10-CM | POA: Diagnosis not present

## 2016-03-15 DIAGNOSIS — E7800 Pure hypercholesterolemia, unspecified: Secondary | ICD-10-CM | POA: Diagnosis not present

## 2016-03-21 DIAGNOSIS — I2782 Chronic pulmonary embolism: Secondary | ICD-10-CM | POA: Diagnosis not present

## 2016-03-21 DIAGNOSIS — G4700 Insomnia, unspecified: Secondary | ICD-10-CM | POA: Diagnosis not present

## 2016-03-23 DIAGNOSIS — N390 Urinary tract infection, site not specified: Secondary | ICD-10-CM | POA: Diagnosis not present

## 2016-03-23 DIAGNOSIS — F5101 Primary insomnia: Secondary | ICD-10-CM | POA: Diagnosis not present

## 2016-03-23 DIAGNOSIS — I10 Essential (primary) hypertension: Secondary | ICD-10-CM | POA: Diagnosis not present

## 2016-03-23 DIAGNOSIS — K219 Gastro-esophageal reflux disease without esophagitis: Secondary | ICD-10-CM | POA: Diagnosis not present

## 2016-03-23 DIAGNOSIS — E1165 Type 2 diabetes mellitus with hyperglycemia: Secondary | ICD-10-CM | POA: Diagnosis not present

## 2016-03-23 DIAGNOSIS — Z7901 Long term (current) use of anticoagulants: Secondary | ICD-10-CM | POA: Diagnosis not present

## 2016-03-23 DIAGNOSIS — J449 Chronic obstructive pulmonary disease, unspecified: Secondary | ICD-10-CM | POA: Diagnosis not present

## 2016-03-23 DIAGNOSIS — E7800 Pure hypercholesterolemia, unspecified: Secondary | ICD-10-CM | POA: Diagnosis not present

## 2016-04-06 DIAGNOSIS — Z7901 Long term (current) use of anticoagulants: Secondary | ICD-10-CM | POA: Diagnosis not present

## 2016-04-21 DIAGNOSIS — Z7901 Long term (current) use of anticoagulants: Secondary | ICD-10-CM | POA: Diagnosis not present

## 2016-05-23 DIAGNOSIS — Z7901 Long term (current) use of anticoagulants: Secondary | ICD-10-CM | POA: Diagnosis not present

## 2016-05-30 DIAGNOSIS — Z7901 Long term (current) use of anticoagulants: Secondary | ICD-10-CM | POA: Diagnosis not present

## 2016-06-23 DIAGNOSIS — J449 Chronic obstructive pulmonary disease, unspecified: Secondary | ICD-10-CM | POA: Diagnosis not present

## 2016-06-23 DIAGNOSIS — E1165 Type 2 diabetes mellitus with hyperglycemia: Secondary | ICD-10-CM | POA: Diagnosis not present

## 2016-06-23 DIAGNOSIS — F5101 Primary insomnia: Secondary | ICD-10-CM | POA: Diagnosis not present

## 2016-06-23 DIAGNOSIS — N390 Urinary tract infection, site not specified: Secondary | ICD-10-CM | POA: Diagnosis not present

## 2016-06-23 DIAGNOSIS — K219 Gastro-esophageal reflux disease without esophagitis: Secondary | ICD-10-CM | POA: Diagnosis not present

## 2016-06-23 DIAGNOSIS — E7800 Pure hypercholesterolemia, unspecified: Secondary | ICD-10-CM | POA: Diagnosis not present

## 2016-06-23 DIAGNOSIS — I10 Essential (primary) hypertension: Secondary | ICD-10-CM | POA: Diagnosis not present

## 2016-06-30 DIAGNOSIS — E7800 Pure hypercholesterolemia, unspecified: Secondary | ICD-10-CM | POA: Diagnosis not present

## 2016-06-30 DIAGNOSIS — E1165 Type 2 diabetes mellitus with hyperglycemia: Secondary | ICD-10-CM | POA: Diagnosis not present

## 2016-06-30 DIAGNOSIS — Z7901 Long term (current) use of anticoagulants: Secondary | ICD-10-CM | POA: Diagnosis not present

## 2016-07-13 DIAGNOSIS — Z7901 Long term (current) use of anticoagulants: Secondary | ICD-10-CM | POA: Diagnosis not present

## 2016-07-28 DIAGNOSIS — Z7901 Long term (current) use of anticoagulants: Secondary | ICD-10-CM | POA: Diagnosis not present

## 2016-08-10 DIAGNOSIS — Z7901 Long term (current) use of anticoagulants: Secondary | ICD-10-CM | POA: Diagnosis not present

## 2016-08-13 DIAGNOSIS — N3000 Acute cystitis without hematuria: Secondary | ICD-10-CM | POA: Diagnosis not present

## 2016-08-17 ENCOUNTER — Encounter: Payer: Self-pay | Admitting: Emergency Medicine

## 2016-08-17 ENCOUNTER — Emergency Department
Admission: EM | Admit: 2016-08-17 | Discharge: 2016-08-18 | Disposition: A | Discharge status: Home / Self Care | Payer: Commercial Managed Care - HMO | Source: Home / Self Care | Attending: Emergency Medicine | Admitting: Emergency Medicine

## 2016-08-17 DIAGNOSIS — I10 Essential (primary) hypertension: Secondary | ICD-10-CM | POA: Insufficient documentation

## 2016-08-17 DIAGNOSIS — R4182 Altered mental status, unspecified: Secondary | ICD-10-CM | POA: Diagnosis present

## 2016-08-17 DIAGNOSIS — T370X5A Adverse effect of sulfonamides, initial encounter: Secondary | ICD-10-CM | POA: Diagnosis not present

## 2016-08-17 DIAGNOSIS — N390 Urinary tract infection, site not specified: Secondary | ICD-10-CM | POA: Diagnosis not present

## 2016-08-17 DIAGNOSIS — E118 Type 2 diabetes mellitus with unspecified complications: Secondary | ICD-10-CM | POA: Diagnosis not present

## 2016-08-17 DIAGNOSIS — J449 Chronic obstructive pulmonary disease, unspecified: Secondary | ICD-10-CM

## 2016-08-17 DIAGNOSIS — L03115 Cellulitis of right lower limb: Secondary | ICD-10-CM | POA: Insufficient documentation

## 2016-08-17 DIAGNOSIS — Z823 Family history of stroke: Secondary | ICD-10-CM | POA: Diagnosis not present

## 2016-08-17 DIAGNOSIS — M7989 Other specified soft tissue disorders: Secondary | ICD-10-CM | POA: Diagnosis not present

## 2016-08-17 DIAGNOSIS — Z9181 History of falling: Secondary | ICD-10-CM | POA: Diagnosis not present

## 2016-08-17 DIAGNOSIS — Z885 Allergy status to narcotic agent status: Secondary | ICD-10-CM | POA: Diagnosis not present

## 2016-08-17 DIAGNOSIS — E875 Hyperkalemia: Secondary | ICD-10-CM | POA: Diagnosis not present

## 2016-08-17 DIAGNOSIS — S199XXA Unspecified injury of neck, initial encounter: Secondary | ICD-10-CM | POA: Diagnosis not present

## 2016-08-17 DIAGNOSIS — Z86718 Personal history of other venous thrombosis and embolism: Secondary | ICD-10-CM | POA: Diagnosis not present

## 2016-08-17 DIAGNOSIS — E119 Type 2 diabetes mellitus without complications: Secondary | ICD-10-CM

## 2016-08-17 DIAGNOSIS — R2681 Unsteadiness on feet: Secondary | ICD-10-CM | POA: Diagnosis not present

## 2016-08-17 DIAGNOSIS — Q898 Other specified congenital malformations: Secondary | ICD-10-CM | POA: Diagnosis not present

## 2016-08-17 DIAGNOSIS — R410 Disorientation, unspecified: Secondary | ICD-10-CM | POA: Diagnosis not present

## 2016-08-17 DIAGNOSIS — Z79899 Other long term (current) drug therapy: Secondary | ICD-10-CM | POA: Insufficient documentation

## 2016-08-17 DIAGNOSIS — Z7984 Long term (current) use of oral hypoglycemic drugs: Secondary | ICD-10-CM

## 2016-08-17 DIAGNOSIS — G9340 Encephalopathy, unspecified: Secondary | ICD-10-CM | POA: Diagnosis not present

## 2016-08-17 DIAGNOSIS — Z880 Allergy status to penicillin: Secondary | ICD-10-CM | POA: Diagnosis not present

## 2016-08-17 DIAGNOSIS — I4891 Unspecified atrial fibrillation: Secondary | ICD-10-CM | POA: Diagnosis not present

## 2016-08-17 DIAGNOSIS — F0390 Unspecified dementia without behavioral disturbance: Secondary | ICD-10-CM | POA: Diagnosis not present

## 2016-08-17 DIAGNOSIS — G9341 Metabolic encephalopathy: Secondary | ICD-10-CM | POA: Diagnosis not present

## 2016-08-17 DIAGNOSIS — J9811 Atelectasis: Secondary | ICD-10-CM | POA: Diagnosis not present

## 2016-08-17 DIAGNOSIS — E785 Hyperlipidemia, unspecified: Secondary | ICD-10-CM | POA: Diagnosis present

## 2016-08-17 DIAGNOSIS — L0390 Cellulitis, unspecified: Secondary | ICD-10-CM | POA: Diagnosis not present

## 2016-08-17 DIAGNOSIS — G4730 Sleep apnea, unspecified: Secondary | ICD-10-CM | POA: Diagnosis present

## 2016-08-17 DIAGNOSIS — Z820 Family history of epilepsy and other diseases of the nervous system: Secondary | ICD-10-CM | POA: Diagnosis not present

## 2016-08-17 DIAGNOSIS — Z7901 Long term (current) use of anticoagulants: Secondary | ICD-10-CM | POA: Insufficient documentation

## 2016-08-17 DIAGNOSIS — Z90710 Acquired absence of both cervix and uterus: Secondary | ICD-10-CM | POA: Diagnosis not present

## 2016-08-17 DIAGNOSIS — K219 Gastro-esophageal reflux disease without esophagitis: Secondary | ICD-10-CM | POA: Diagnosis not present

## 2016-08-17 DIAGNOSIS — Z86711 Personal history of pulmonary embolism: Secondary | ICD-10-CM | POA: Diagnosis not present

## 2016-08-17 DIAGNOSIS — W1830XA Fall on same level, unspecified, initial encounter: Secondary | ICD-10-CM | POA: Diagnosis present

## 2016-08-17 LAB — CBC WITH DIFFERENTIAL/PLATELET
BASOS ABS: 0.1 10*3/uL (ref 0–0.1)
BASOS PCT: 1 %
Eosinophils Absolute: 0.2 10*3/uL (ref 0–0.7)
Eosinophils Relative: 2 %
HEMATOCRIT: 37.6 % (ref 35.0–47.0)
Hemoglobin: 12.6 g/dL (ref 12.0–16.0)
Lymphocytes Relative: 9 %
Lymphs Abs: 0.9 10*3/uL — ABNORMAL LOW (ref 1.0–3.6)
MCH: 28.8 pg (ref 26.0–34.0)
MCHC: 33.6 g/dL (ref 32.0–36.0)
MCV: 85.7 fL (ref 80.0–100.0)
MONO ABS: 1.1 10*3/uL — AB (ref 0.2–0.9)
Monocytes Relative: 12 %
NEUTROS ABS: 7.4 10*3/uL — AB (ref 1.4–6.5)
NEUTROS PCT: 76 %
PLATELETS: 201 10*3/uL (ref 150–440)
RBC: 4.39 MIL/uL (ref 3.80–5.20)
RDW: 15.8 % — AB (ref 11.5–14.5)
WBC: 9.6 10*3/uL (ref 3.6–11.0)

## 2016-08-17 LAB — BASIC METABOLIC PANEL
ANION GAP: 9 (ref 5–15)
BUN: 13 mg/dL (ref 6–20)
CALCIUM: 9.2 mg/dL (ref 8.9–10.3)
CO2: 24 mmol/L (ref 22–32)
Chloride: 97 mmol/L — ABNORMAL LOW (ref 101–111)
Creatinine, Ser: 0.83 mg/dL (ref 0.44–1.00)
Glucose, Bld: 188 mg/dL — ABNORMAL HIGH (ref 65–99)
Potassium: 5 mmol/L (ref 3.5–5.1)
SODIUM: 130 mmol/L — AB (ref 135–145)

## 2016-08-17 NOTE — ED Triage Notes (Signed)
Patient with redness and swelling to right lower leg that started 2 days ago. Patient with small amount of weeping from right lower leg. Patient with history of cellulitis in same leg.

## 2016-08-18 ENCOUNTER — Emergency Department: Payer: Commercial Managed Care - HMO

## 2016-08-18 LAB — PROTIME-INR
INR: 2.31
PROTHROMBIN TIME: 25.8 s — AB (ref 11.4–15.2)

## 2016-08-18 MED ORDER — CEPHALEXIN 250 MG PO CAPS: 250.0000 mg | ORAL_CAPSULE | Freq: Three times a day (TID) | ORAL | 0 refills | Status: DC

## 2016-08-18 MED ORDER — ACETAMINOPHEN 325 MG PO TABS
650.0000 mg | ORAL_TABLET | Freq: Once | ORAL | Status: AC
  Administered 2016-08-18: 650 mg via ORAL
  Filled 2016-08-18: qty 2

## 2016-08-18 MED ORDER — BACITRACIN ZINC 500 UNIT/GM EX OINT
TOPICAL_OINTMENT | Freq: Two times a day (BID) | CUTANEOUS | Status: DC
  Administered 2016-08-18: 1 via TOPICAL
  Filled 2016-08-18: qty 0.9

## 2016-08-18 MED ORDER — IPRATROPIUM-ALBUTEROL 0.5-2.5 (3) MG/3ML IN SOLN
3.0000 mL | Freq: Once | RESPIRATORY_TRACT | Status: AC
  Administered 2016-08-18: 3 mL via RESPIRATORY_TRACT
  Filled 2016-08-18: qty 3

## 2016-08-18 MED ORDER — CEPHALEXIN 250 MG PO CAPS
250.0000 mg | ORAL_CAPSULE | Freq: Once | ORAL | Status: AC
  Administered 2016-08-18: 250 mg via ORAL
  Filled 2016-08-18: qty 1

## 2016-08-18 NOTE — ED Provider Notes (Signed)
[facility] Emergency Department Provider Note   ____________________________________________   First MD Initiated Contact with Patient 08/18/16 0038     (approximate)  I have reviewed the triage vital signs and the nursing notes.   HISTORY  Chief Complaint Cellulitis    HPI Natalie Rogers is a 82 y.o. female who presents to the ED from home with a chief complaint of right lower leg redness and swelling. Patient has a history of cellulitis as well as DVT/PE, on warfarin. Noted redness and swelling yesterday. Unsure if she struck her leg on something, but states she did not fall. Daughter saw the leg on Christmas day which was 3 days ago and it appeared at baseline. Patient denies associated fever, chills, chest pain, shortness of breath, abdominal pain, nausea, vomiting, diarrhea. She is currently on Septra since 6 days ago for UTI. Nothing makes her symptoms better or worse.   Past Medical History:  Diagnosis Date  . COPD (chronic obstructive pulmonary disease) (HCC)   . DVT (deep venous thrombosis) (HCC)   . GERD (gastroesophageal reflux disease)   . HLD (hyperlipidemia)   . HTN (hypertension)   . PE (pulmonary embolism)    on chronic anticoagulation  . Sleep apnea   . Type 2 diabetes mellitus [facility])     Patient Active Problem List   Diagnosis Date Noted  . Lower back pain 01/06/2016  . DDD (degenerative disc disease), lumbar 01/06/2016  . Elevated troponin 01/06/2016  . Leukocytosis 01/06/2016  . Type 2 diabetes mellitus (HCC) 01/04/2016  . Acute encephalopathy 01/04/2016  . Acute pyelonephritis 01/04/2016  . HTN (hypertension) 01/04/2016  . HLD (hyperlipidemia) 01/04/2016  . GERD (gastroesophageal reflux disease) 01/04/2016    Past Surgical History:  Procedure Laterality Date  . ABDOMINAL HYSTERECTOMY    . CHOLECYSTECTOMY      Prior to Admission medications   Medication Sig Start Date End Date Taking? Authorizing Provider    acetaminophen (TYLENOL) 500 MG tablet Take 500 mg by mouth every 6 (six) hours as needed for mild pain or headache.    Historical Provider, MD  cephALEXin (KEFLEX) 250 MG capsule Take 1 capsule (250 mg total) by mouth 3 (three) times daily. 08/18/16 08/25/16  Irean Hong, MD  feeding supplement, GLUCERNA SHAKE, (GLUCERNA SHAKE) LIQD Take 237 mLs by mouth 3 (three) times daily between meals. 01/06/16   Katharina Caper, MD  glipiZIDE (GLUCOTROL XL) 10 MG 24 hr tablet Take 10 mg by mouth 2 (two) times daily.    Historical Provider, MD  lidocaine (LIDODERM) 5 % Place 1 patch onto the skin daily. Remove & Discard patch within 12 hours or as directed by MD 01/06/16   Katharina Caper, MD  lisinopril (PRINIVIL,ZESTRIL) 40 MG tablet Take 40 mg by mouth daily.    Historical Provider, MD  metFORMIN (GLUCOPHAGE-XR) 500 MG 24 hr tablet Take 500 mg by mouth 2 (two) times daily.    Historical Provider, MD  metoprolol succinate (TOPROL-XL) 25 MG 24 hr tablet Take 25 mg by mouth daily.    Historical Provider, MD  ondansetron (ZOFRAN) 4 MG tablet Take 1 tablet (4 mg total) by mouth every 6 (six) hours as needed for nausea. 01/06/16   Katharina Caper, MD  pantoprazole (PROTONIX) 40 MG tablet Take 40 mg by mouth daily.    Historical Provider, MD  pravastatin (PRAVACHOL) 40 MG tablet Take 40 mg by mouth at bedtime.    Historical Provider, MD  traZODone (DESYREL) 50 MG tablet Take  50 mg by mouth at bedtime.    Historical Provider, MD  vitamin B-12 (CYANOCOBALAMIN) 1000 MCG tablet Take 1,000 mcg by mouth daily.    Historical Provider, MD  warfarin (COUMADIN) 2.5 MG tablet Take 2.5 mg by mouth at bedtime. Pt takes this dose on Monday and Thursday.    Historical Provider, MD  warfarin (COUMADIN) 5 MG tablet Take 5 mg by mouth at bedtime. Pt takes this dose on Saturday, Tuesday, Wednesday, Friday, and Saturday.    Historical Provider, MD    Allergies Penicillins; Codeine; and Demerol [meperidine]  Family History  Problem Relation  Age of Onset  . Heart disease    . Alzheimer's disease    . Stroke    . Breast cancer      Social History Social History  Substance Use Topics  . Smoking status: Never Smoker  . Smokeless tobacco: Not on file  . Alcohol use No    Review of Systems  Constitutional: No fever/chills. Eyes: No visual changes. ENT: No sore throat. Cardiovascular: Denies chest pain. Respiratory: Denies shortness of breath. Gastrointestinal: No abdominal pain.  No nausea, no vomiting.  No diarrhea.  No constipation. Genitourinary: Negative for dysuria. Musculoskeletal: Positive for RLE redness and swelling. Negative for back pain. Skin: Negative for rash. Neurological: Negative for headaches, focal weakness or numbness.  10-point ROS otherwise negative.  ____________________________________________   PHYSICAL EXAM:  VITAL SIGNS: ED Triage Vitals  Enc Vitals Group     BP 08/17/16 2214 (!) 179/70     Pulse Rate 08/17/16 2214 93     Resp 08/17/16 2214 20     Temp 08/17/16 2214 97.5 F (36.4 C)     Temp Source 08/17/16 2214 Oral     SpO2 08/17/16 2214 98 %     Weight 08/17/16 2219 171 lb (77.6 kg)     Height 08/17/16 2219 5\' 3"  (1.6 m)     Head Circumference --      Peak Flow --      Pain Score 08/17/16 2219 8     Pain Loc --      Pain Edu? --      Excl. in GC? --     Constitutional: Alert and oriented. Well appearing and in no acute distress. Eyes: Conjunctivae are normal. PERRL. EOMI. Head: Atraumatic. Nose: No congestion/rhinnorhea. Mouth/Throat: Mucous membranes are moist.  Oropharynx non-erythematous. Neck: No stridor.   Cardiovascular: Normal rate, regular rhythm. Grossly normal heart sounds.  Good peripheral circulation. Respiratory: Normal respiratory effort.  No retractions. Lungs with scattered wheezing. Gastrointestinal: Soft and nontender. No distention. No abdominal bruits. No CVA tenderness. Musculoskeletal:  RLE: Warmth, erythema, swelling and weeping to right  lower extremity. Area of purplish contusion to medial leg with palpable induration suggestive of hematoma. Right calf 40 cm, left calf 34 cm. Palpable distal pulses bilaterally. Symmetrically warm limbs without evidence for ischemia. Neurologic:  Normal speech and language. No gross focal neurologic deficits are appreciated.  Skin:  Skin is warm, dry and intact. No rash noted. Psychiatric: Mood and affect are normal. Speech and behavior are normal.  ____________________________________________   LABS (all labs ordered are listed, but only abnormal results are displayed)  Labs Reviewed  CBC WITH DIFFERENTIAL/PLATELET - Abnormal; Notable for the following:       Result Value   RDW 15.8 (*)    Neutro Abs 7.4 (*)    Lymphs Abs 0.9 (*)    Monocytes Absolute 1.1 (*)    All other components  within normal limits  BASIC METABOLIC PANEL - Abnormal; Notable for the following:    Sodium 130 (*)    Chloride 97 (*)    Glucose, Bld 188 (*)    All other components within normal limits  PROTIME-INR - Abnormal; Notable for the following:    Prothrombin Time 25.8 (*)    All other components within normal limits   ____________________________________________  EKG  None ____________________________________________  RADIOLOGY  Chest 2 view (viewed by me, interpreted per Dr. Cherly Hensenhang): Minimal bilateral atelectasis noted. Lungs otherwise grossly clear.  Venous ultrasound interpreted per Dr. Sterling BigKwon: No evidence of acute deep venous thrombosis. Possible chronic  recannulized proximal femoral vein DVT currently patent  demonstrating compressibility.    Mildly enlarged right groin node measuring 3.8 x 0.8 x 2.2 cm right  and maintains its hilar fat. This may be reactive node.   ____________________________________________   PROCEDURES  Procedure(s) performed: None  Procedures  Critical Care performed: No  ____________________________________________   INITIAL IMPRESSION / ASSESSMENT  AND PLAN / ED COURSE  Pertinent labs & imaging results that were available during my care of the patient were reviewed by me and considered in my medical decision making (see chart for details).  82 year old female who presents with redness and swelling of her right lower leg. History of DVT, on warfarin. Clinically leg appears to be cellulitic with likely hematoma. CBC is within normal limits. Will check INR as patient is on warfarin, obtain venous Doppler and reassess. Review of records reveal that patient had Keflex without adverse reaction back in May 2017; will initiate treatment with Keflex. Did not have her inhaler dose last evening; will administer DuoNeb for scattered wheezing.  Clinical Course as of Aug 18 558  Thu Aug 18, 2016  0253 Updated patient and daughters of ultrasound results. Will place on Keflex and refer her to the wound center for outpatient follow-up. Wheezing improved. Strict return precautions given. All verbalize understanding and agree with plan of care.  [JS]    Clinical Course User Index [JS] Irean HongJade J Sung, MD     ____________________________________________   FINAL CLINICAL IMPRESSION(S) / ED DIAGNOSES  Final diagnoses:  Cellulitis of right lower extremity      NEW MEDICATIONS STARTED DURING THIS VISIT:  Discharge Medication List as of 08/18/2016  2:56 AM    START taking these medications   Details  cephALEXin (KEFLEX) 250 MG capsule Take 1 capsule (250 mg total) by mouth 3 (three) times daily., Starting Thu 08/18/2016, Until Thu 08/25/2016, Print         Note:  This document was prepared using Dragon voice recognition software and may include unintentional dictation errors.    Irean HongJade J Sung, MD 08/18/16 [phone]

## 2016-08-18 NOTE — Discharge Instructions (Signed)
1. Take antibiotic as prescribed (Keflex 250 mg 3 times daily 7 days). 2. Keep leg clean and dry. 3. Return to the ER for worsening symptoms, fever, persistent vomiting or other concerns.

## 2016-08-18 NOTE — ED Notes (Signed)
Pt family reports pt is currently taking Bactrim DS BID for a UTI; ABX were started last Saturday morning (BID x10 days).

## 2016-08-18 NOTE — ED Notes (Signed)
Pt was put on cardiac monitor

## 2016-08-20 ENCOUNTER — Inpatient Hospital Stay
Admission: EM | Admit: 2016-08-20 | Discharge: 2016-08-24 | DRG: 602 | Disposition: A | Discharge status: Skilled Nursing Facility | Payer: Commercial Managed Care - HMO | Attending: Internal Medicine | Admitting: Internal Medicine

## 2016-08-20 ENCOUNTER — Encounter: Payer: Self-pay | Admitting: *Deleted

## 2016-08-20 ENCOUNTER — Emergency Department: Payer: Commercial Managed Care - HMO

## 2016-08-20 DIAGNOSIS — Z9181 History of falling: Secondary | ICD-10-CM

## 2016-08-20 DIAGNOSIS — Z880 Allergy status to penicillin: Secondary | ICD-10-CM | POA: Diagnosis not present

## 2016-08-20 DIAGNOSIS — R4182 Altered mental status, unspecified: Secondary | ICD-10-CM | POA: Diagnosis present

## 2016-08-20 DIAGNOSIS — Z86718 Personal history of other venous thrombosis and embolism: Secondary | ICD-10-CM

## 2016-08-20 DIAGNOSIS — I4891 Unspecified atrial fibrillation: Secondary | ICD-10-CM | POA: Diagnosis present

## 2016-08-20 DIAGNOSIS — K219 Gastro-esophageal reflux disease without esophagitis: Secondary | ICD-10-CM | POA: Diagnosis present

## 2016-08-20 DIAGNOSIS — S199XXA Unspecified injury of neck, initial encounter: Secondary | ICD-10-CM | POA: Diagnosis not present

## 2016-08-20 DIAGNOSIS — T370X5A Adverse effect of sulfonamides, initial encounter: Secondary | ICD-10-CM | POA: Diagnosis present

## 2016-08-20 DIAGNOSIS — G4730 Sleep apnea, unspecified: Secondary | ICD-10-CM | POA: Diagnosis present

## 2016-08-20 DIAGNOSIS — E785 Hyperlipidemia, unspecified: Secondary | ICD-10-CM | POA: Diagnosis present

## 2016-08-20 DIAGNOSIS — R410 Disorientation, unspecified: Secondary | ICD-10-CM | POA: Diagnosis not present

## 2016-08-20 DIAGNOSIS — N390 Urinary tract infection, site not specified: Secondary | ICD-10-CM | POA: Diagnosis present

## 2016-08-20 DIAGNOSIS — Z7984 Long term (current) use of oral hypoglycemic drugs: Secondary | ICD-10-CM | POA: Diagnosis not present

## 2016-08-20 DIAGNOSIS — Z86711 Personal history of pulmonary embolism: Secondary | ICD-10-CM

## 2016-08-20 DIAGNOSIS — L03115 Cellulitis of right lower limb: Secondary | ICD-10-CM | POA: Diagnosis present

## 2016-08-20 DIAGNOSIS — M6281 Muscle weakness (generalized): Secondary | ICD-10-CM

## 2016-08-20 DIAGNOSIS — W1830XA Fall on same level, unspecified, initial encounter: Secondary | ICD-10-CM | POA: Diagnosis present

## 2016-08-20 DIAGNOSIS — J449 Chronic obstructive pulmonary disease, unspecified: Secondary | ICD-10-CM | POA: Diagnosis present

## 2016-08-20 DIAGNOSIS — W19XXXA Unspecified fall, initial encounter: Secondary | ICD-10-CM

## 2016-08-20 DIAGNOSIS — I10 Essential (primary) hypertension: Secondary | ICD-10-CM | POA: Diagnosis present

## 2016-08-20 DIAGNOSIS — Q898 Other specified congenital malformations: Secondary | ICD-10-CM

## 2016-08-20 DIAGNOSIS — G9340 Encephalopathy, unspecified: Secondary | ICD-10-CM | POA: Diagnosis present

## 2016-08-20 DIAGNOSIS — G9341 Metabolic encephalopathy: Secondary | ICD-10-CM | POA: Diagnosis present

## 2016-08-20 DIAGNOSIS — Z90710 Acquired absence of both cervix and uterus: Secondary | ICD-10-CM

## 2016-08-20 DIAGNOSIS — Z885 Allergy status to narcotic agent status: Secondary | ICD-10-CM

## 2016-08-20 DIAGNOSIS — Z79899 Other long term (current) drug therapy: Secondary | ICD-10-CM

## 2016-08-20 DIAGNOSIS — F0390 Unspecified dementia without behavioral disturbance: Secondary | ICD-10-CM | POA: Diagnosis present

## 2016-08-20 DIAGNOSIS — E875 Hyperkalemia: Secondary | ICD-10-CM | POA: Diagnosis present

## 2016-08-20 DIAGNOSIS — Z7901 Long term (current) use of anticoagulants: Secondary | ICD-10-CM

## 2016-08-20 DIAGNOSIS — E119 Type 2 diabetes mellitus without complications: Secondary | ICD-10-CM | POA: Diagnosis present

## 2016-08-20 DIAGNOSIS — L0390 Cellulitis, unspecified: Secondary | ICD-10-CM | POA: Diagnosis present

## 2016-08-20 DIAGNOSIS — Z823 Family history of stroke: Secondary | ICD-10-CM

## 2016-08-20 DIAGNOSIS — R2681 Unsteadiness on feet: Secondary | ICD-10-CM

## 2016-08-20 DIAGNOSIS — Z820 Family history of epilepsy and other diseases of the nervous system: Secondary | ICD-10-CM | POA: Diagnosis not present

## 2016-08-20 LAB — CBC
HEMATOCRIT: 36.1 % (ref 35.0–47.0)
Hemoglobin: 12.2 g/dL (ref 12.0–16.0)
MCH: 29 pg (ref 26.0–34.0)
MCHC: 33.9 g/dL (ref 32.0–36.0)
MCV: 85.5 fL (ref 80.0–100.0)
PLATELETS: 232 10*3/uL (ref 150–440)
RBC: 4.22 MIL/uL (ref 3.80–5.20)
RDW: 15.3 % — AB (ref 11.5–14.5)
WBC: 8.1 10*3/uL (ref 3.6–11.0)

## 2016-08-20 LAB — GLUCOSE, CAPILLARY
Glucose-Capillary: 155 mg/dL — ABNORMAL HIGH (ref 65–99)
Glucose-Capillary: 149 mg/dL — ABNORMAL HIGH (ref 65–99)

## 2016-08-20 LAB — URINALYSIS, COMPLETE (UACMP) WITH MICROSCOPIC
BACTERIA UA: NONE SEEN
Bilirubin Urine: NEGATIVE
GLUCOSE, UA: 150 mg/dL — AB
Hgb urine dipstick: NEGATIVE
KETONES UR: NEGATIVE mg/dL
Leukocytes, UA: NEGATIVE
Nitrite: NEGATIVE
PH: 5 (ref 5.0–8.0)
Protein, ur: 100 mg/dL — AB
SPECIFIC GRAVITY, URINE: 1.023 (ref 1.005–1.030)

## 2016-08-20 LAB — COMPREHENSIVE METABOLIC PANEL
ALBUMIN: 3.5 g/dL (ref 3.5–5.0)
ALT: 14 U/L (ref 14–54)
AST: 25 U/L (ref 15–41)
Alkaline Phosphatase: 47 U/L (ref 38–126)
Anion gap: 8 (ref 5–15)
BUN: 13 mg/dL (ref 6–20)
CHLORIDE: 99 mmol/L — AB (ref 101–111)
CO2: 25 mmol/L (ref 22–32)
CREATININE: 0.92 mg/dL (ref 0.44–1.00)
Calcium: 8.9 mg/dL (ref 8.9–10.3)
GFR calc Af Amer: >60 mL/min (ref >60)
GFR, EST NON AFRICAN AMERICAN: 53 mL/min — AB (ref >60)
GLUCOSE: 233 mg/dL — AB (ref 65–99)
Potassium: 4.6 mmol/L (ref 3.5–5.1)
Sodium: 132 mmol/L — ABNORMAL LOW (ref 135–145)
Total Bilirubin: 0.5 mg/dL (ref 0.3–1.2)
Total Protein: 7.2 g/dL (ref 6.5–8.1)

## 2016-08-20 LAB — PROTIME-INR
INR: 2.27
PROTHROMBIN TIME: 25.4 s — AB (ref 11.4–15.2)

## 2016-08-20 MED ORDER — SODIUM CHLORIDE 0.9% FLUSH
3.0000 mL | Freq: Two times a day (BID) | INTRAVENOUS | Status: DC
  Administered 2016-08-20 – 2016-08-23 (×7): 3 mL via INTRAVENOUS

## 2016-08-20 MED ORDER — CLINDAMYCIN PHOSPHATE 600 MG/50ML IV SOLN
600.0000 mg | Freq: Once | INTRAVENOUS | Status: AC
Start: 2016-08-20 — End: 2016-08-20
  Administered 2016-08-20: 600 mg via INTRAVENOUS
  Filled 2016-08-20: qty 50

## 2016-08-20 MED ORDER — ONDANSETRON HCL 4 MG PO TABS: 4.0000 mg | ORAL_TABLET | Freq: Four times a day (QID) | ORAL | Status: DC | PRN

## 2016-08-20 MED ORDER — METFORMIN HCL ER 500 MG PO TB24
500.0000 mg | ORAL_TABLET | Freq: Two times a day (BID) | ORAL | Status: DC
  Administered 2016-08-20 – 2016-08-24 (×7): 500 mg via ORAL
  Filled 2016-08-20 (×8): qty 1

## 2016-08-20 MED ORDER — METOPROLOL SUCCINATE ER 25 MG PO TB24
25.0000 mg | ORAL_TABLET | Freq: Every day | ORAL | Status: DC
  Administered 2016-08-21 – 2016-08-24 (×4): 25 mg via ORAL
  Filled 2016-08-20 (×4): qty 1

## 2016-08-20 MED ORDER — HYDRALAZINE HCL 20 MG/ML IJ SOLN: 10.0000 mg | Freq: Four times a day (QID) | INTRAMUSCULAR | Status: DC | PRN

## 2016-08-20 MED ORDER — SODIUM CHLORIDE 0.9 % IV SOLN: 250.0000 mL | INTRAVENOUS | Status: DC | PRN

## 2016-08-20 MED ORDER — LIDOCAINE 5 % EX PTCH
1.0000 | MEDICATED_PATCH | CUTANEOUS | Status: DC
  Administered 2016-08-21 – 2016-08-24 (×4): 1 via TRANSDERMAL
  Filled 2016-08-20 (×4): qty 1

## 2016-08-20 MED ORDER — GLIPIZIDE ER 10 MG PO TB24
10.0000 mg | ORAL_TABLET | Freq: Two times a day (BID) | ORAL | Status: DC
  Filled 2016-08-20: qty 1

## 2016-08-20 MED ORDER — ONDANSETRON HCL 4 MG/2ML IJ SOLN: 4.0000 mg | Freq: Four times a day (QID) | INTRAMUSCULAR | Status: DC | PRN

## 2016-08-20 MED ORDER — ACETAMINOPHEN 325 MG PO TABS: 650.0000 mg | ORAL_TABLET | Freq: Four times a day (QID) | ORAL | Status: DC | PRN

## 2016-08-20 MED ORDER — PANTOPRAZOLE SODIUM 40 MG PO TBEC
40.0000 mg | DELAYED_RELEASE_TABLET | Freq: Every day | ORAL | Status: DC
  Administered 2016-08-21 – 2016-08-24 (×4): 40 mg via ORAL
  Filled 2016-08-20 (×4): qty 1

## 2016-08-20 MED ORDER — GLUCERNA SHAKE PO LIQD
237.0000 mL | Freq: Three times a day (TID) | ORAL | Status: DC
  Administered 2016-08-21 – 2016-08-24 (×6): 237 mL via ORAL

## 2016-08-20 MED ORDER — TRAZODONE HCL 50 MG PO TABS
25.0000 mg | ORAL_TABLET | Freq: Every day | ORAL | Status: DC
  Administered 2016-08-20 – 2016-08-23 (×4): 25 mg via ORAL
  Filled 2016-08-20 (×4): qty 1

## 2016-08-20 MED ORDER — POLYETHYLENE GLYCOL 3350 17 G PO PACK: 17.0000 g | PACK | Freq: Every day | ORAL | Status: DC | PRN

## 2016-08-20 MED ORDER — INSULIN ASPART 100 UNIT/ML ~~LOC~~ SOLN: 0.0000 [IU] | Freq: Every day | SUBCUTANEOUS | Status: DC

## 2016-08-20 MED ORDER — PRAVASTATIN SODIUM 40 MG PO TABS
40.0000 mg | ORAL_TABLET | Freq: Every day | ORAL | Status: DC
  Administered 2016-08-20 – 2016-08-23 (×4): 40 mg via ORAL
  Filled 2016-08-20 (×4): qty 1

## 2016-08-20 MED ORDER — ACETAMINOPHEN 650 MG RE SUPP: 650.0000 mg | Freq: Four times a day (QID) | RECTAL | Status: DC | PRN

## 2016-08-20 MED ORDER — ALBUTEROL SULFATE (2.5 MG/3ML) 0.083% IN NEBU: 2.5000 mg | INHALATION_SOLUTION | RESPIRATORY_TRACT | Status: DC | PRN

## 2016-08-20 MED ORDER — LISINOPRIL 20 MG PO TABS
40.0000 mg | ORAL_TABLET | Freq: Every day | ORAL | Status: DC
  Administered 2016-08-21 – 2016-08-23 (×3): 40 mg via ORAL
  Filled 2016-08-20 (×3): qty 2

## 2016-08-20 MED ORDER — MEROPENEM-SODIUM CHLORIDE 1 GM/50ML IV SOLR
1.0000 g | Freq: Two times a day (BID) | INTRAVENOUS | Status: DC
  Administered 2016-08-20 – 2016-08-23 (×6): 1 g via INTRAVENOUS
  Filled 2016-08-20 (×7): qty 50

## 2016-08-20 MED ORDER — WARFARIN SODIUM 5 MG PO TABS: 2.5000 mg | ORAL_TABLET | ORAL | Status: DC

## 2016-08-20 MED ORDER — SODIUM CHLORIDE 0.9% FLUSH: 3.0000 mL | INTRAVENOUS | Status: DC | PRN

## 2016-08-20 MED ORDER — WARFARIN - PHARMACIST DOSING INPATIENT
Freq: Every day | Status: DC
  Administered 2016-08-20 – 2016-08-23 (×4)

## 2016-08-20 MED ORDER — BISACODYL 10 MG RE SUPP: 10.0000 mg | Freq: Every day | RECTAL | Status: DC | PRN

## 2016-08-20 MED ORDER — IBUPROFEN 400 MG PO TABS: 400.0000 mg | ORAL_TABLET | Freq: Four times a day (QID) | ORAL | Status: DC | PRN

## 2016-08-20 MED ORDER — INSULIN ASPART 100 UNIT/ML ~~LOC~~ SOLN
0.0000 [IU] | Freq: Three times a day (TID) | SUBCUTANEOUS | Status: DC
  Administered 2016-08-20: 2 [IU] via SUBCUTANEOUS
  Administered 2016-08-21: 1 [IU] via SUBCUTANEOUS
  Administered 2016-08-22: 3 [IU] via SUBCUTANEOUS
  Administered 2016-08-22 – 2016-08-23 (×2): 2 [IU] via SUBCUTANEOUS
  Filled 2016-08-20: qty 1
  Filled 2016-08-20 (×2): qty 2
  Filled 2016-08-20: qty 3
  Filled 2016-08-20: qty 2

## 2016-08-20 MED ORDER — WARFARIN SODIUM 5 MG PO TABS
5.0000 mg | ORAL_TABLET | ORAL | Status: DC
  Administered 2016-08-20 – 2016-08-21 (×2): 5 mg via ORAL
  Filled 2016-08-20 (×2): qty 1

## 2016-08-20 MED ORDER — DOCUSATE SODIUM 100 MG PO CAPS
100.0000 mg | ORAL_CAPSULE | Freq: Two times a day (BID) | ORAL | Status: DC
  Administered 2016-08-20 – 2016-08-23 (×7): 100 mg via ORAL
  Filled 2016-08-20 (×7): qty 1

## 2016-08-20 MED ORDER — VANCOMYCIN HCL IN DEXTROSE 1-5 GM/200ML-% IV SOLN
1000.0000 mg | Freq: Once | INTRAVENOUS | Status: DC
  Filled 2016-08-20: qty 200

## 2016-08-20 NOTE — ED Notes (Signed)
Pt. Daughter states current UTI tx x 10 days, confusion worsening today, tx Wednesday here by MD Dolores FrameSung for right leg wound, fall yesterday-denied need for transport by EMS.

## 2016-08-20 NOTE — H&P (Signed)
[employer] - [place] [facility]   PATIENT NAME: Natalie Rogers    MR#:  [id number]  DATE OF BIRTH:  1924/06/21  DATE OF ADMISSION:  08/20/2016  PRIMARY CARE PHYSICIAN: Marina GoodellFELDPAUSCH, DALE E, MD   REQUESTING/REFERRING PHYSICIAN: Dr. Jarold MottoPatterson  CHIEF COMPLAINT:   Chief Complaint  Patient presents with  . Altered Mental Status    HISTORY OF PRESENT ILLNESS:  Natalie Rogers  is a 82 y.o. female with a known history of Diabetes, hypertension, mild cognitive impairment presents to the emergency room due to worsening right lower extremity cellulitis and confusion. Patient has been on Bactrim for urinary tract infection for 1 week. She was seen in the emergency room here 3 days back for right lower extremity cellulitis and started on Keflex. Daughter noticed that patient has been more confused and erythema in the right leg is increased and brought her back to the emergency room. She has no fever or leukocytosis. Minimal drainage. No fluctuations or swelling. Patient is being admitted for outpatient failed right lower extremity cellulitis.  PAST MEDICAL HISTORY:   Past Medical History:  Diagnosis Date  . COPD (chronic obstructive pulmonary disease) (HCC)   . DVT (deep venous thrombosis) (HCC)   . GERD (gastroesophageal reflux disease)   . HLD (hyperlipidemia)   . HTN (hypertension)   . PE (pulmonary embolism)    on chronic anticoagulation  . Sleep apnea   . Type 2 diabetes mellitus (HCC)     PAST SURGICAL HISTORY:   Past Surgical History:  Procedure Laterality Date  . ABDOMINAL HYSTERECTOMY    . CHOLECYSTECTOMY      SOCIAL HISTORY:   Social History  Substance Use Topics  . Smoking status: Never Smoker  . Smokeless tobacco: Not on file  . Alcohol use No    FAMILY HISTORY:   Family History  Problem Relation Age of Onset  . Heart disease    . Alzheimer's disease    . Stroke    . Breast cancer      DRUG ALLERGIES:   Allergies  Allergen Reactions   . Penicillins Anaphylaxis, Rash and Other (See Comments)    Has patient had a PCN reaction causing immediate rash, facial/tongue/throat swelling, SOB or lightheadedness with hypotension: Yes Has patient had a PCN reaction causing severe rash involving mucus membranes or skin necrosis: No Has patient had a PCN reaction that required hospitalization No Has patient had a PCN reaction occurring within the last 10 years: No If all of the above answers are "NO", then may proceed with Cephalosporin use.  . Codeine   . Demerol [Meperidine] Rash    REVIEW OF SYSTEMS:   Review of Systems  Unable to perform ROS: Dementia    MEDICATIONS AT HOME:   Prior to Admission medications   Medication Sig Start Date End Date Taking? Authorizing Provider  cephALEXin (KEFLEX) 250 MG capsule Take 1 capsule (250 mg total) by mouth 3 (three) times daily. 08/18/16 08/25/16 Yes Irean HongJade J Sung, MD  glipiZIDE (GLUCOTROL XL) 10 MG 24 hr tablet Take 10 mg by mouth 2 (two) times daily.   Yes Historical Provider, MD  lidocaine (LIDODERM) 5 % Place 1 patch onto the skin daily. Remove & Discard patch within 12 hours or as directed by MD 01/06/16  Yes Katharina Caperima Vaickute, MD  lisinopril (PRINIVIL,ZESTRIL) 40 MG tablet Take 40 mg by mouth daily.   Yes Historical Provider, MD  metFORMIN (GLUCOPHAGE-XR) 500 MG 24 hr tablet Take 500 mg by mouth 2 (  two) times daily.   Yes Historical Provider, MD  metoprolol succinate (TOPROL-XL) 25 MG 24 hr tablet Take 25 mg by mouth daily.   Yes Historical Provider, MD  pantoprazole (PROTONIX) 40 MG tablet Take 40 mg by mouth daily.   Yes Historical Provider, MD  pravastatin (PRAVACHOL) 40 MG tablet Take 40 mg by mouth at bedtime.   Yes Historical Provider, MD  Sulfamethoxazole-Trimethoprim (BACTRIM PO) Take 1 tablet by mouth 2 (two) times daily.   Yes Historical Provider, MD  traZODone (DESYREL) 50 MG tablet Take 25 mg by mouth at bedtime.    Yes Historical Provider, MD  vitamin B-12 (CYANOCOBALAMIN)  1000 MCG tablet Take 1,000 mcg by mouth daily.   Yes Historical Provider, MD  warfarin (COUMADIN) 2.5 MG tablet Take 2.5 mg by mouth at bedtime. Pt takes this dose on Monday and Thursday.   Yes Historical Provider, MD  warfarin (COUMADIN) 5 MG tablet Take 5 mg by mouth at bedtime. Pt takes this dose on Saturday, Tuesday, Wednesday, Friday, and Saturday.   Yes Historical Provider, MD  acetaminophen (TYLENOL) 500 MG tablet Take 500 mg by mouth every 6 (six) hours as needed for mild pain or headache.    Historical Provider, MD  feeding supplement, GLUCERNA SHAKE, (GLUCERNA SHAKE) LIQD Take 237 mLs by mouth 3 (three) times daily between meals. 01/06/16   Katharina Caperima Vaickute, MD  ondansetron (ZOFRAN) 4 MG tablet Take 1 tablet (4 mg total) by mouth every 6 (six) hours as needed for nausea. Patient not taking: Reported on 08/20/2016 01/06/16   Katharina Caperima Vaickute, MD     VITAL SIGNS:  Blood pressure (!) 179/52, pulse 64, temperature 98 F (36.7 C), temperature source Oral, resp. rate (!) 22, height 5\' 3"  (1.6 m), weight 77.1 kg (170 lb), SpO2 98 %.  PHYSICAL EXAMINATION:  Physical Exam  GENERAL:  82 y.o.-year-old patient lying in the bed with no acute distress.  EYES: Pupils equal, round, reactive to light and accommodation. No scleral icterus. Extraocular muscles intact.  HEENT: Head atraumatic, normocephalic. Oropharynx and nasopharynx clear. No oropharyngeal erythema, moist oral mucosa  NECK:  Supple, no jugular venous distention. No thyroid enlargement, no tenderness.  LUNGS: Normal breath sounds bilaterally, no wheezing, rales, rhonchi. No use of accessory muscles of respiration.  CARDIOVASCULAR: S1, S2 normal. No murmurs, rubs, or gallops.  ABDOMEN: Soft, nontender, nondistended. Bowel sounds present. No organomegaly or mass.  EXTREMITIES: No pedal edema, cyanosis, or clubbing. + 2 pedal & radial pulses b/l.   NEUROLOGIC: Cranial nerves II through XII are intact. No focal Motor or sensory deficits  appreciated b/l PSYCHIATRIC: The patient is Awake and alert. Not oriented to place or time. SKIN: Right leg erythema and serosanguineous discharge. Warmth and tenderness.  LABORATORY PANEL:   CBC  Recent Labs Lab 08/20/16 1248  WBC 8.1  HGB 12.2  HCT 36.1  PLT 232   ------------------------------------------------------------------------------------------------------------------  Chemistries   Recent Labs Lab 08/20/16 1248  NA 132*  K 4.6  CL 99*  CO2 25  GLUCOSE 233*  BUN 13  CREATININE 0.92  CALCIUM 8.9  AST 25  ALT 14  ALKPHOS 47  BILITOT 0.5   ------------------------------------------------------------------------------------------------------------------  Cardiac Enzymes No results for input(s): TROPONINI in the last 168 hours. ------------------------------------------------------------------------------------------------------------------  RADIOLOGY:  Ct Head Wo Contrast  Result Date: 08/20/2016 CLINICAL DATA:  Recent UTI, confusion and hallucinations, fall from bed last night, pt is on coumadin EXAM: CT HEAD WITHOUT CONTRAST CT CERVICAL SPINE WITHOUT CONTRAST TECHNIQUE: Multidetector CT imaging of  the head and cervical spine was performed following the standard protocol without intravenous contrast. Multiplanar CT image reconstructions of the cervical spine were also generated. COMPARISON:  01/04/2016 FINDINGS: CT HEAD FINDINGS Brain: No evidence of acute infarction, hemorrhage, hydrocephalus, extra-axial collection or mass lesion/mass effect. There is ventricular and sulcal enlargement reflecting moderate atrophy. White matter hypoattenuation is noted consistent with extensive chronic microvascular ischemic change. Vascular: No hyperdense vessel or unexpected calcification. Skull: Normal. Negative for fracture or focal lesion. Sinuses/Orbits: Globes orbits are unremarkable. Visualized sinuses and mastoid air cells are clear. Other: None. CT CERVICAL SPINE  FINDINGS Alignment: Normal. Skull base and vertebrae: No acute fracture. No primary bone lesion or focal pathologic process. Soft tissues and spinal canal: No prevertebral fluid or swelling. No visible canal hematoma. 9 mm right thyroid lobe nodule, stable. Disc levels: There are stable disc and facet degenerative changes throughout the cervical spine with generally mild degrees of neural foraminal narrowing and mild narrowing of the mid to lower central spinal canal, stable. Upper chest: Unremarkable Other: None IMPRESSION: HEAD CT: No acute intracranial abnormalities. No skull fracture. Atrophy and extensive chronic microvascular ischemic change. CERVICAL CT:  No fracture or acute finding. Electronically Signed   By: Amie Portland M.D.   On: 08/20/2016 13:37   Ct Cervical Spine Wo Contrast  Result Date: 08/20/2016 CLINICAL DATA:  Recent UTI, confusion and hallucinations, fall from bed last night, pt is on coumadin EXAM: CT HEAD WITHOUT CONTRAST CT CERVICAL SPINE WITHOUT CONTRAST TECHNIQUE: Multidetector CT imaging of the head and cervical spine was performed following the standard protocol without intravenous contrast. Multiplanar CT image reconstructions of the cervical spine were also generated. COMPARISON:  01/04/2016 FINDINGS: CT HEAD FINDINGS Brain: No evidence of acute infarction, hemorrhage, hydrocephalus, extra-axial collection or mass lesion/mass effect. There is ventricular and sulcal enlargement reflecting moderate atrophy. White matter hypoattenuation is noted consistent with extensive chronic microvascular ischemic change. Vascular: No hyperdense vessel or unexpected calcification. Skull: Normal. Negative for fracture or focal lesion. Sinuses/Orbits: Globes orbits are unremarkable. Visualized sinuses and mastoid air cells are clear. Other: None. CT CERVICAL SPINE FINDINGS Alignment: Normal. Skull base and vertebrae: No acute fracture. No primary bone lesion or focal pathologic process. Soft  tissues and spinal canal: No prevertebral fluid or swelling. No visible canal hematoma. 9 mm right thyroid lobe nodule, stable. Disc levels: There are stable disc and facet degenerative changes throughout the cervical spine with generally mild degrees of neural foraminal narrowing and mild narrowing of the mid to lower central spinal canal, stable. Upper chest: Unremarkable Other: None IMPRESSION: HEAD CT: No acute intracranial abnormalities. No skull fracture. Atrophy and extensive chronic microvascular ischemic change. CERVICAL CT:  No fracture or acute finding. Electronically Signed   By: Amie Portland M.D.   On: 08/20/2016 13:37   IMPRESSION AND PLAN:   * Right lower extremity cellulitis failed outpatient therapy with Keflex We'll place patient on Zosyn. No history of MRSA. We will need to cover for pseudomonas and anaerobic bacteria. Recent right lower extremity Doppler showed chronic nonocclusive DVT. Nothing acute.  * Acute encephalopathy over mild cognitive impairment CT scan of the head showed nothing acute. Age-related atrophy. The confusion is likely due to acute infection.  * Atrial fibrillation. Continue home medications along with warfarin. Pharmacy to dose Coumadin. INR is therapeutic.  * Hypertension Continue medications.  * DVT prophylaxis. Patient is on Coumadin.  All the records are reviewed and case discussed with ED provider. Management plans discussed with the patient,  family and they are in agreement.  CODE STATUS: Partial code with no intubation but okay for CPR  TOTAL TIME TAKING CARE OF THIS PATIENT: 40 minutes.   Milagros Loll R M.D on 08/20/2016 at 3:48 PM  Between 7am to 6pm - Pager - [phone]  After 6pm go to [email or web address] - password [email or web address] [facility]  [facility] [place] [facility]  Office  [phone]  CC: Primary care physician; [facility], Madaline Guthrie, MD  Note: This dictation was prepared with Dragon dictation along with smaller phrase technology. Any  transcriptional errors that result from this process are unintentional.

## 2016-08-20 NOTE — Progress Notes (Signed)
ANTICOAGULATION CONSULT NOTE - Initial Consult  Pharmacy Consult for warfarin  Indication: atrial fibrillation  Allergies  Allergen Reactions  . Penicillins Anaphylaxis, Rash and Other (See Comments)    Has patient had a PCN reaction causing immediate rash, facial/tongue/throat swelling, SOB or lightheadedness with hypotension: Yes Has patient had a PCN reaction causing severe rash involving mucus membranes or skin necrosis: No Has patient had a PCN reaction that required hospitalization No Has patient had a PCN reaction occurring within the last 10 years: No If all of the above answers are "NO", then may proceed with Cephalosporin use.  . Codeine   . Demerol [Meperidine] Rash    Patient Measurements: Height: 5\' 5"  (165.1 cm) Weight: 161 lb 9.6 oz (73.3 kg) IBW/kg (Calculated) : 57  Vital Signs: Temp: 97.8 F (36.6 C) (12/30 1727) Temp Source: Oral (12/30 1727) BP: 173/59 (12/30 1727) Pulse Rate: 61 (12/30 1727)  Labs:  Recent Labs  08/17/16 2222 08/18/16 0203 08/20/16 1247 08/20/16 1248  HGB 12.6  --   --  12.2  HCT 37.6  --   --  36.1  PLT 201  --   --  232  LABPROT  --  25.8* 25.4*  --   INR  --  2.31 2.27  --   CREATININE 0.83  --   --  0.92    Estimated Creatinine Clearance: 40.7 mL/min (by C-G formula based on SCr of 0.92 mg/dL).   Medical History: Past Medical History:  Diagnosis Date  . COPD (chronic obstructive pulmonary disease) (HCC)   . DVT (deep venous thrombosis) (HCC)   . GERD (gastroesophageal reflux disease)   . HLD (hyperlipidemia)   . HTN (hypertension)   . PE (pulmonary embolism)    on chronic anticoagulation  . Sleep apnea   . Type 2 diabetes mellitus  [facility](HCC)     Assessment: 82 yo female with PMH of atrial fibrillation admitted with cellulitis. Pharmacy consulted for warfarin management while inpatient.   Home Dose: Warfarin 2.5mg : Mon, Thurs                       Warfarin 5mg : Sun, Tues, Weds, Fri, Sat    DATE  INR  DOSE 12/30  2.27  5mg    Goal of Therapy:  INR 2-3 Monitor platelets by anticoagulation protocol: Yes   Plan:  INR Therapeutic on admission. Will continue with patient's home regimen. Will monitor INRs daily while patient in on antibiotics.   Gardner CandleSheema M Jamare Vanatta, PharmD, BCPS Clinical Pharmacist 08/20/2016 5:46 PM

## 2016-08-20 NOTE — ED Provider Notes (Signed)
[facility] Emergency Department Provider Note    First MD Initiated Contact with Patient 08/20/16 1357     (approximate)  I have reviewed the triage vital signs and the nursing notes.   HISTORY  Chief Complaint Altered Mental Status    HPI Natalie Rogers is a 82 y.o. female with history of COPD as well as well as recent diagnosis of cellulitis status post treatment with Septra as well as Keflex presents with worsening erythema and pain to right lower extremity associated with altered mental status and hallucinations with subsequent fall last night.They've noted continued drainage that is purulent from the right lower extremity. Recently had Doppler ultrasound 27th was negative for DVT. She is on Coumadin for history of DVT.   Past Medical History:  Diagnosis Date  . COPD (chronic obstructive pulmonary disease) (HCC)   . DVT (deep venous thrombosis) (HCC)   . GERD (gastroesophageal reflux disease)   . HLD (hyperlipidemia)   . HTN (hypertension)   . PE (pulmonary embolism)    on chronic anticoagulation  . Sleep apnea   . Type 2 diabetes mellitus (HCC)    Family History  Problem Relation Age of Onset  . Heart disease    . Alzheimer's disease    . Stroke    . Breast cancer     Past Surgical History:  Procedure Laterality Date  . ABDOMINAL HYSTERECTOMY    . CHOLECYSTECTOMY     Patient Active Problem List   Diagnosis Date Noted  . Lower back pain 01/06/2016  . DDD (degenerative disc disease), lumbar 01/06/2016  . Elevated troponin 01/06/2016  . Leukocytosis 01/06/2016  . Type 2 diabetes mellitus (HCC) 01/04/2016  . Acute encephalopathy 01/04/2016  . Acute pyelonephritis 01/04/2016  . HTN (hypertension) 01/04/2016  . HLD (hyperlipidemia) 01/04/2016  . GERD (gastroesophageal reflux disease) 01/04/2016      Prior to Admission medications   Medication Sig Start Date End Date Taking? Authorizing Provider  acetaminophen (TYLENOL) 500 MG  tablet Take 500 mg by mouth every 6 (six) hours as needed for mild pain or headache.    Historical Provider, MD  cephALEXin (KEFLEX) 250 MG capsule Take 1 capsule (250 mg total) by mouth 3 (three) times daily. 08/18/16 08/25/16  Irean HongJade J Sung, MD  feeding supplement, GLUCERNA SHAKE, (GLUCERNA SHAKE) LIQD Take 237 mLs by mouth 3 (three) times daily between meals. 01/06/16   Katharina Caperima Vaickute, MD  glipiZIDE (GLUCOTROL XL) 10 MG 24 hr tablet Take 10 mg by mouth 2 (two) times daily.    Historical Provider, MD  lidocaine (LIDODERM) 5 % Place 1 patch onto the skin daily. Remove & Discard patch within 12 hours or as directed by MD 01/06/16   Katharina Caperima Vaickute, MD  lisinopril (PRINIVIL,ZESTRIL) 40 MG tablet Take 40 mg by mouth daily.    Historical Provider, MD  metFORMIN (GLUCOPHAGE-XR) 500 MG 24 hr tablet Take 500 mg by mouth 2 (two) times daily.    Historical Provider, MD  metoprolol succinate (TOPROL-XL) 25 MG 24 hr tablet Take 25 mg by mouth daily.    Historical Provider, MD  ondansetron (ZOFRAN) 4 MG tablet Take 1 tablet (4 mg total) by mouth every 6 (six) hours as needed for nausea. 01/06/16   Katharina Caperima Vaickute, MD  pantoprazole (PROTONIX) 40 MG tablet Take 40 mg by mouth daily.    Historical Provider, MD  pravastatin (PRAVACHOL) 40 MG tablet Take 40 mg by mouth at bedtime.    Historical Provider, MD  traZODone (  DESYREL) 50 MG tablet Take 50 mg by mouth at bedtime.    Historical Provider, MD  vitamin B-12 (CYANOCOBALAMIN) 1000 MCG tablet Take 1,000 mcg by mouth daily.    Historical Provider, MD  warfarin (COUMADIN) 2.5 MG tablet Take 2.5 mg by mouth at bedtime. Pt takes this dose on Monday and Thursday.    Historical Provider, MD  warfarin (COUMADIN) 5 MG tablet Take 5 mg by mouth at bedtime. Pt takes this dose on Saturday, Tuesday, Wednesday, Friday, and Saturday.    Historical Provider, MD    Allergies Penicillins; Codeine; and Demerol [meperidine]    Social History Social History  Substance Use Topics  .  Smoking status: Never Smoker  . Smokeless tobacco: Not on file  . Alcohol use No    Review of Systems Patient denies headaches, rhinorrhea, blurry vision, numbness, shortness of breath, chest pain, edema, cough, abdominal pain, nausea, vomiting, diarrhea, dysuria, fevers, rashes or hallucinations unless otherwise stated above in HPI. ____________________________________________   PHYSICAL EXAM:  VITAL SIGNS: Vitals:   08/20/16 1419 08/20/16 1420  BP: (!) 179/52   Pulse: 62 64  Resp: 18 (!) 22  Temp:      Constitutional: Alert Elderly appearing and in no acute distress. Eyes: Conjunctivae are normal. PERRL. EOMI. Head: Atraumatic. Nose: No congestion/rhinnorhea. Mouth/Throat: Mucous membranes are moist.  Oropharynx non-erythematous. Neck: No stridor. Painless ROM. No cervical spine tenderness to palpation Hematological/Lymphatic/Immunilogical: No cervical lymphadenopathy. Cardiovascular: Normal rate, regular rhythm. Grossly normal heart sounds.  Good peripheral circulation. Respiratory: Normal respiratory effort.  No retractions. Lungs CTAB. Gastrointestinal: Soft and nontender. No distention. No abdominal bruits. No CVA tenderness. Musculoskeletal: Large area of bruising cellulitis and erythema to the right lower extremity with streaking erythema proximally and purulent drainage..  No joint effusions. Neurologic:  Normal speech and language. No gross focal neurologic deficits are appreciated.  Skin:  Skin is warm, dry and intact. Rash as described above Psychiatric: Mood and affect are normal. Speech and behavior are normal.  ____________________________________________   LABS (all labs ordered are listed, but only abnormal results are displayed)  Results for orders placed or performed during the hospital encounter of 08/20/16 (from the past 24 hour(s))  Comprehensive metabolic panel     Status: Abnormal   Collection Time: 08/20/16 12:48 PM  Result Value Ref Range    Sodium 132 (L) 135 - 145 mmol/L   Potassium 4.6 3.5 - 5.1 mmol/L   Chloride 99 (L) 101 - 111 mmol/L   CO2 25 22 - 32 mmol/L   Glucose, Bld 233 (H) 65 - 99 mg/dL   BUN 13 6 - 20 mg/dL   Creatinine, Ser 1.610.92 0.44 - 1.00 mg/dL   Calcium 8.9 8.9 - 09.610.3 mg/dL   Total Protein 7.2 6.5 - 8.1 g/dL   Albumin 3.5 3.5 - 5.0 g/dL   AST 25 15 - 41 U/L   ALT 14 14 - 54 U/L   Alkaline Phosphatase 47 38 - 126 U/L   Total Bilirubin 0.5 0.3 - 1.2 mg/dL   GFR calc non Af Amer 53 (L) >60 mL/min   GFR calc Af Amer >60 >60 mL/min   Anion gap 8 5 - 15  CBC     Status: Abnormal   Collection Time: 08/20/16 12:48 PM  Result Value Ref Range   WBC 8.1 3.6 - 11.0 K/uL   RBC 4.22 3.80 - 5.20 MIL/uL   Hemoglobin 12.2 12.0 - 16.0 g/dL   HCT 04.536.1 40.935.0 - 81.147.0 %  MCV 85.5 80.0 - 100.0 fL   MCH 29.0 26.0 - 34.0 pg   MCHC 33.9 32.0 - 36.0 g/dL   RDW 16.1 (H) 09.6 - 04.5 %   Platelets 232 150 - 440 K/uL   ____________________________________________  EKG___________________________________________  RADIOLOGY  I personally reviewed all radiographic images ordered to evaluate for the above acute complaints and reviewed radiology reports and findings.  These findings were personally discussed with the patient.  Please see medical record for radiology report.  ____________________________________________   PROCEDURES  Procedure(s) performed:  Procedures    Critical Care performed: no ____________________________________________   INITIAL IMPRESSION / ASSESSMENT AND PLAN / ED COURSE  Pertinent labs & imaging results that were available during my care of the patient were reviewed by me and considered in my medical decision making (see chart for details).  DDX: sdh, iph, tia, cellulitis, abscess, venous stasis  VAUDIE ENGEBRETSEN is a 82 y.o. who presents to the ED with  severe cellulitis of right lower extremity failing outpatient treatment with Septra and Keflex. Patient arrives afebrile and he  mechanically stable but area of erythema with purulent drainage appears to be worsening and patient is complaining of worsening pain. Daughter stated patient's mental status is changing reporting more confusion. He can imaging ordered to evaluate for acute intracranial abnormality shows no acute trauma. Based on her failure to improve on outpatient abx will start with IV Clinda. Spoke with hospitalist Dr. Quentin Cornwall has requested vancomycin as well.  Do not feel repeat duplex clinically indicated. Have discussed with the patient and available family all diagnostics and treatments performed thus far and all questions were answered to the best of my ability. The patient demonstrates understanding and agreement with plan.   Clinical Course      ____________________________________________   FINAL CLINICAL IMPRESSION(S) / ED DIAGNOSES  Final diagnoses:  Cellulitis of right lower extremity  Fall from standing, initial encounter      NEW MEDICATIONS STARTED DURING THIS VISIT:  New Prescriptions   No medications on file     Note:  This document was prepared using Dragon voice recognition software and may include unintentional dictation errors.    Willy Eddy, MD 08/20/16 1520

## 2016-08-20 NOTE — ED Triage Notes (Addendum)
Daughter states pt was placed on abx for cellulitis last month, daughter states pt was then diagnoised with a UTI and placed on abx, states she continues to have confusion and hallucinations, states last night she got out of the bed and fell because she thought someone was in the bed with her, pt on coumadin with hx of PE

## 2016-08-20 NOTE — Progress Notes (Signed)
CH responded to an order for AD for Pt in Rm 118A. CH met with Pt who was with her 3 daughters in the Rm and went over the AD with the Pt. Pt requested more time to review the material by herself before completing it. CH assured Pt that CH would be available whenever she is ready to complete Ad.    08/20/16 2200  Clinical Encounter Type  Visited With Patient  Visit Type Initial;ED  Referral From Nurse  Consult/Referral To Chaplain  Spiritual Encounters  Spiritual Needs Brochure;Other (Comment)   

## 2016-08-20 NOTE — Progress Notes (Addendum)
Pharmacy Antibiotic Note  Natalie Rogers is a 82 y.o. female admitted on 08/20/2016 with  cellulitis. Patient failed outpatient treatment with Keflex. Pharmacy has been consulted for Zosyn dosing, however patient reports throat swelling with PCN. Pharmacy now consulted for Meropenem dosing. Patient received 1 dose Vancomycin and Clindamycin in ED.   Plan: Will start patient on meropenem 1gm IV every 12 hours based on current CrCl of <1550ml/min.   Height: 5\' 3"  (160 cm) Weight: 170 lb (77.1 kg) IBW/kg (Calculated) : 52.4  Temp (24hrs), Avg:98 F (36.7 C), Min:98 F (36.7 C), Max:98 F (36.7 C)   Recent Labs Lab 08/17/16 2222 08/20/16 1248  WBC 9.6 8.1  CREATININE 0.83 0.92    Estimated Creatinine Clearance: 40 mL/min (by C-G formula based on SCr of 0.92 mg/dL).    Allergies  Allergen Reactions  . Penicillins Anaphylaxis, Rash and Other (See Comments)    Has patient had a PCN reaction causing immediate rash, facial/tongue/throat swelling, SOB or lightheadedness with hypotension: Yes Has patient had a PCN reaction causing severe rash involving mucus membranes or skin necrosis: No Has patient had a PCN reaction that required hospitalization No Has patient had a PCN reaction occurring within the last 10 years: No If all of the above answers are "NO", then may proceed with Cephalosporin use.  . Codeine   . Demerol [Meperidine] Rash    Antimicrobials this admission: 12/30 meropenem >>   Dose adjustments this admission:   Microbiology results: BCx:  UCx:  Sputum:  MRSA PCR:   Thank you for allowing pharmacy to be a part of this patient's care.  Gardner CandleSheema M Leyla Soliz, PharmD, BCPS Clinical Pharmacist 08/20/2016 5:22 PM

## 2016-08-21 LAB — BASIC METABOLIC PANEL
ANION GAP: 7 (ref 5–15)
BUN: 14 mg/dL (ref 6–20)
CHLORIDE: 102 mmol/L (ref 101–111)
CO2: 27 mmol/L (ref 22–32)
Calcium: 8.8 mg/dL — ABNORMAL LOW (ref 8.9–10.3)
Creatinine, Ser: 0.84 mg/dL (ref 0.44–1.00)
GFR calc Af Amer: >60 mL/min (ref >60)
GFR, EST NON AFRICAN AMERICAN: 59 mL/min — AB (ref >60)
GLUCOSE: 63 mg/dL — AB (ref 65–99)
POTASSIUM: 4.1 mmol/L (ref 3.5–5.1)
Sodium: 136 mmol/L (ref 135–145)

## 2016-08-21 LAB — CBC
HEMATOCRIT: 32.5 % — AB (ref 35.0–47.0)
HEMOGLOBIN: 11.3 g/dL — AB (ref 12.0–16.0)
MCH: 29.1 pg (ref 26.0–34.0)
MCHC: 34.6 g/dL (ref 32.0–36.0)
MCV: 84 fL (ref 80.0–100.0)
Platelets: 210 10*3/uL (ref 150–440)
RBC: 3.87 MIL/uL (ref 3.80–5.20)
RDW: 15 % — AB (ref 11.5–14.5)
WBC: 7.9 10*3/uL (ref 3.6–11.0)

## 2016-08-21 LAB — GLUCOSE, CAPILLARY
GLUCOSE-CAPILLARY: 54 mg/dL — AB (ref 65–99)
GLUCOSE-CAPILLARY: 148 mg/dL — AB (ref 65–99)
Glucose-Capillary: 112 mg/dL — ABNORMAL HIGH (ref 65–99)
Glucose-Capillary: 81 mg/dL (ref 65–99)
Glucose-Capillary: 115 mg/dL — ABNORMAL HIGH (ref 65–99)

## 2016-08-21 LAB — PROTIME-INR
INR: 2.84
Prothrombin Time: 30.4 seconds — ABNORMAL HIGH (ref 11.4–15.2)

## 2016-08-21 MED ORDER — GLIPIZIDE ER 5 MG PO TB24
5.0000 mg | ORAL_TABLET | Freq: Two times a day (BID) | ORAL | Status: DC
  Administered 2016-08-22 – 2016-08-23 (×3): 5 mg via ORAL
  Filled 2016-08-21 (×2): qty 1

## 2016-08-21 NOTE — Progress Notes (Addendum)
ANTICOAGULATION CONSULT NOTE - Follow Up  Pharmacy Consult for warfarin  Indication: atrial fibrillation  Allergies  Allergen Reactions  . Penicillins Anaphylaxis, Rash and Other (See Comments)    Has patient had a PCN reaction causing immediate rash, facial/tongue/throat swelling, SOB or lightheadedness with hypotension: Yes Has patient had a PCN reaction causing severe rash involving mucus membranes or skin necrosis: No Has patient had a PCN reaction that required hospitalization No Has patient had a PCN reaction occurring within the last 10 years: No If all of the above answers are "NO", then may proceed with Cephalosporin use.  . Codeine   . Demerol [Meperidine] Rash    Patient Measurements: Height: 5\' 5"  (165.1 cm) Weight: 160 lb 12.8 oz (72.9 kg) IBW/kg (Calculated) : 57  Vital Signs: Temp: 97.9 F (36.6 C) (12/31 0920) Temp Source: Oral (12/31 0920) BP: 132/38 (12/31 0920) Pulse Rate: 69 (12/31 0920)  Labs:  Recent Labs  08/20/16 1247 08/20/16 1248 08/21/16 0434  HGB  --  12.2 11.3*  HCT  --  36.1 32.5*  PLT  --  232 210  LABPROT 25.4*  --  30.4*  INR 2.27  --  2.84  CREATININE  --  0.92 0.84    Estimated Creatinine Clearance: 44.6 mL/min (by C-G formula based on SCr of 0.84 mg/dL).   Medical History: Past Medical History:  Diagnosis Date  . COPD (chronic obstructive pulmonary disease) (HCC)   . DVT (deep venous thrombosis) (HCC)   . GERD (gastroesophageal reflux disease)   . HLD (hyperlipidemia)   . HTN (hypertension)   . PE (pulmonary embolism)    on chronic anticoagulation  . Type 2 diabetes mellitus [facility](HCC)     Assessment: 82 yo female with PMH of atrial fibrillation admitted with cellulitis. Pharmacy consulted for warfarin management while inpatient.   Home Dose: Warfarin 2.5mg : Mon, Thurs                       Warfarin 5mg : Sun, Tues, Weds, Fri, Sat   DATE  INR  DOSE 12/30  2.27  5mg  12/31               2.84   Goal of Therapy:  INR  2-3 Monitor platelets by anticoagulation protocol: Yes   Plan:  INR Therapeutic on admission. Will continue with patient's home regimen. Will monitor INRs daily while patient in on antibiotics.   Stormy CardKatsoudas,Ellouise Mcwhirter K, [place] Clinical Pharmacist 08/21/2016 10:22 AM

## 2016-08-21 NOTE — Plan of Care (Signed)
Problem: Safety: Goal: Ability to remain free from injury will improve Outcome: Progressing Bed alarm and chair alarm in place.  Problem: Physical Regulation: Goal: Will remain free from infection Outcome: Progressing Remains on IV Antibiotics. WBC's improving.

## 2016-08-21 NOTE — Consult Note (Signed)
WOC Nurse wound consult note Reason for Consult: Patient with area of cellulitis on right medial LE.  States she had this condition "a time or two" when she was "younger". Wound type:Infectious Pressure Ulcer POA: Yes/No Measurement:12cm x 7cm with purple discoloration and small dry pinpoint areas of tissue loss. Periphery of wound with 1cm of erythema, no induration or edema and (+) for warmth. Wound bed:As described above Drainage (amount, consistency, odor) None Periwound: As described above Dressing procedure/placement/frequency: I will provide Nursing with guidance via the Orders for conservative POC that will support an antimicrobial environment plus of of protection from further injury (trauma or pressure to the heel) using twice daily application of xeroform gauze and wrapping to secure from toe to knee. Elevation of the heels using Prevalon pressure redistribution heel boots is advised while in bed and these are provided today. WOC nursing team will not follow, but will remain available to this patient, the nursing and medical teams.  Please re-consult if needed. Thanks, Ladona MowLaurie Trueman Worlds, MSN, RN, GNP, Hans EdenCWOCN, CWON-AP, FAAN  Pager# [phone]

## 2016-08-21 NOTE — Progress Notes (Signed)
[facility] - [place] at [facility]   PATIENT NAME: Natalie Rogers    MR#:  [id number]  DATE OF BIRTH:  12-03-1923  SUBJECTIVE: Admitted for altered mental status, found to have worsening right leg cellulitis, UTI. Patient is alert awake oriented today.   CHIEF COMPLAINT:   Chief Complaint  Patient presents with  . Altered Mental Status    REVIEW OF SYSTEMS:   ROS CONSTITUTIONAL: No fever, fatigue or weakness.  EYES: No blurred or double vision.  EARS, NOSE, AND THROAT: No tinnitus or ear pain.  RESPIRATORY: No cough, shortness of breath, wheezing or hemoptysis.  CARDIOVASCULAR: No chest pain, orthopnea, edema.  GASTROINTESTINAL: No nausea, vomiting, diarrhea or abdominal pain.  GENITOURINARY: No dysuria, hematuria.  ENDOCRINE: No polyuria, nocturia,  HEMATOLOGY: No anemia, easy bruising or bleeding SKIN: No rash or lesion. MUSCULOSKELETAL: No joint pain or arthritis.   NEUROLOGIC: No tingling, numbness, weakness.  PSYCHIATRY: No anxiety or depression.   DRUG ALLERGIES:   Allergies  Allergen Reactions  . Penicillins Anaphylaxis, Rash and Other (See Comments)    Has patient had a PCN reaction causing immediate rash, facial/tongue/throat swelling, SOB or lightheadedness with hypotension: Yes Has patient had a PCN reaction causing severe rash involving mucus membranes or skin necrosis: No Has patient had a PCN reaction that required hospitalization No Has patient had a PCN reaction occurring within the last 10 years: No If all of the above answers are "NO", then may proceed with Cephalosporin use.  . Codeine   . Demerol [Meperidine] Rash    VITALS:  Blood pressure (!) 132/38, pulse 69, temperature 97.9 F (36.6 C), temperature source Oral, resp. rate 16, height 5\' 5"  (1.651 m), weight 72.9 kg (160 lb 12.8 oz), SpO2 95 %.  PHYSICAL EXAMINATION:  GENERAL:  82 y.o.-year-old patient lying in the bed with no acute distress.  EYES: Pupils equal,  round, reactive to light and accommodation. No scleral icterus. Extraocular muscles intact.  HEENT: Head atraumatic, normocephalic. Oropharynx and nasopharynx clear.  NECK:  Supple, no jugular venous distention. No thyroid enlargement, no tenderness.  LUNGS: Normal breath sounds bilaterally, no wheezing, rales,rhonchi or crepitation. No use of accessory muscles of respiration.  CARDIOVASCULAR: S1, S2 normal. No murmurs, rubs, or gallops.  ABDOMEN: Soft, nontender, nondistended. Bowel sounds present. No organomegaly or mass.  EXTREMITIES noted to have purple discoloration, wound to the right medial part of the leg, no drainage .also has some small dry areas of tissue loss. NEUROLOGIC: Cranial nerves II through XII are intact. Muscle strength 5/5 in all extremities. Sensation intact. Gait not checked.  PSYCHIATRIC: The patient is alert and oriented x 3.  SKIN: No obvious rash, lesion, or ulcer.    LABORATORY PANEL:   CBC  Recent Labs Lab 08/21/16 0434  WBC 7.9  HGB 11.3*  HCT 32.5*  PLT 210   ------------------------------------------------------------------------------------------------------------------  Chemistries   Recent Labs Lab 08/20/16 1248 08/21/16 0434  NA 132* 136  K 4.6 4.1  CL 99* 102  CO2 25 27  GLUCOSE 233* 63*  BUN 13 14  CREATININE 0.92 0.84  CALCIUM 8.9 8.8*  AST 25  --   ALT 14  --   ALKPHOS 47  --   BILITOT 0.5  --    ------------------------------------------------------------------------------------------------------------------  Cardiac Enzymes No results for input(s): TROPONINI in the last 168 hours. ------------------------------------------------------------------------------------------------------------------  RADIOLOGY:  Ct Head Wo Contrast  Result Date: 08/20/2016 CLINICAL DATA:  Recent UTI, confusion and hallucinations, fall from bed last night,  pt is on coumadin EXAM: CT HEAD WITHOUT CONTRAST CT CERVICAL SPINE WITHOUT CONTRAST  TECHNIQUE: Multidetector CT imaging of the head and cervical spine was performed following the standard protocol without intravenous contrast. Multiplanar CT image reconstructions of the cervical spine were also generated. COMPARISON:  01/04/2016 FINDINGS: CT HEAD FINDINGS Brain: No evidence of acute infarction, hemorrhage, hydrocephalus, extra-axial collection or mass lesion/mass effect. There is ventricular and sulcal enlargement reflecting moderate atrophy. White matter hypoattenuation is noted consistent with extensive chronic microvascular ischemic change. Vascular: No hyperdense vessel or unexpected calcification. Skull: Normal. Negative for fracture or focal lesion. Sinuses/Orbits: Globes orbits are unremarkable. Visualized sinuses and mastoid air cells are clear. Other: None. CT CERVICAL SPINE FINDINGS Alignment: Normal. Skull base and vertebrae: No acute fracture. No primary bone lesion or focal pathologic process. Soft tissues and spinal canal: No prevertebral fluid or swelling. No visible canal hematoma. 9 mm right thyroid lobe nodule, stable. Disc levels: There are stable disc and facet degenerative changes throughout the cervical spine with generally mild degrees of neural foraminal narrowing and mild narrowing of the mid to lower central spinal canal, stable. Upper chest: Unremarkable Other: None IMPRESSION: HEAD CT: No acute intracranial abnormalities. No skull fracture. Atrophy and extensive chronic microvascular ischemic change. CERVICAL CT:  No fracture or acute finding. Electronically Signed   By: Amie Portlandavid  Ormond M.D.   On: 08/20/2016 13:37   Ct Cervical Spine Wo Contrast  Result Date: 08/20/2016 CLINICAL DATA:  Recent UTI, confusion and hallucinations, fall from bed last night, pt is on coumadin EXAM: CT HEAD WITHOUT CONTRAST CT CERVICAL SPINE WITHOUT CONTRAST TECHNIQUE: Multidetector CT imaging of the head and cervical spine was performed following the standard protocol without intravenous  contrast. Multiplanar CT image reconstructions of the cervical spine were also generated. COMPARISON:  01/04/2016 FINDINGS: CT HEAD FINDINGS Brain: No evidence of acute infarction, hemorrhage, hydrocephalus, extra-axial collection or mass lesion/mass effect. There is ventricular and sulcal enlargement reflecting moderate atrophy. White matter hypoattenuation is noted consistent with extensive chronic microvascular ischemic change. Vascular: No hyperdense vessel or unexpected calcification. Skull: Normal. Negative for fracture or focal lesion. Sinuses/Orbits: Globes orbits are unremarkable. Visualized sinuses and mastoid air cells are clear. Other: None. CT CERVICAL SPINE FINDINGS Alignment: Normal. Skull base and vertebrae: No acute fracture. No primary bone lesion or focal pathologic process. Soft tissues and spinal canal: No prevertebral fluid or swelling. No visible canal hematoma. 9 mm right thyroid lobe nodule, stable. Disc levels: There are stable disc and facet degenerative changes throughout the cervical spine with generally mild degrees of neural foraminal narrowing and mild narrowing of the mid to lower central spinal canal, stable. Upper chest: Unremarkable Other: None IMPRESSION: HEAD CT: No acute intracranial abnormalities. No skull fracture. Atrophy and extensive chronic microvascular ischemic change. CERVICAL CT:  No fracture or acute finding. Electronically Signed   By: Amie Portlandavid  Ormond M.D.   On: 08/20/2016 13:37    EKG:   Orders placed or performed during the hospital encounter of 01/04/16  . ED EKG  . ED EKG    ASSESSMENT AND PLAN:   1 altered mental status likely secondary to combination of advanced age,, right leg cellulitis: Patient alert, awake, oriented today morning, for aortic cellulitis she is on antibiotics and IV, seen by wound care nurse.  #2 hyperkalemia improved likely due to combination of Bactrim,' advanced age/   #3. COPD: No wheezing  #4 history of DVT in the right  leg, history of traumas to the right leg: Continue  blood thinners   #4 GERD continue PPIs  #5 history of PE. PE: Continue chronic anticoagulation  #6 diabetes mellitus type 2: Continue home medication. Code partial code  All the records are reviewed and case discussed with Care Management/Social Workerr. Management plans discussed with the patient, family and they are in agreement.  CODE STATUS: partial  TOTAL TIME TAKING CARE OF THIS PATIENT: 35minutes.   POSSIBLE D/C IN 1-2DAYS, DEPENDING ON CLINICAL CONDITION.   Katha HammingKONIDENA,Stacie Templin M.D on 08/21/2016 at 12:03 PM  Between 7am to 6pm - Pager - [phone]  After 6pm go to [email or web address] - password [email or web address] [facility]  [place] [place] [facility]  Office  [phone]  CC: Primary care physician; [facility] For Surgery LLCFELDPAUSCH, Madaline GuthrieALE E, MD   Note: This dictation was prepared with Dragon dictation along with smaller phrase technology. Any transcriptional errors that result from this process are unintentional.

## 2016-08-21 NOTE — Evaluation (Signed)
Physical Therapy Evaluation Patient Details Name: Natalie Rogers MRN: [id number] DOB: July 08, 1924 Today's Date: 08/21/2016   History of Present Illness  presented to ER status post fall, worsening AMS and lethargy; admitted with R LE cellulitis, recurrent UTI.  Clinical Impression  Upon evaluation, patient alert and oriented to basic information; limited insight into deficits and need for assist noted.  Currently requiring mod assist for bed mobility; min assist for sit/stand, basic transfers and gait (220').  Constant cuing for gait mechanics, postural extension and walker position/management.  Gait very slow and effortful; requiring 12-13 seconds to complete 10' walk test (significant decrease in speed, indicative of high fall risk).  Recommend continued use of RW and +1 physical assist with all mobility at this time.  Daughter (caregiver) ambulates with assist device herself and unable to provide. Would benefit from skilled PT to address above deficits and promote optimal return to PLOF; recommend transition to STR upon discharge from acute hospitalization.  May consider transition to ALF/LTC at conclusion of STR if daughters unable to manage care at home.     Follow Up Recommendations SNF    Equipment Recommendations       Recommendations for Other Services       Precautions / Restrictions Precautions Precautions: Fall Restrictions Weight Bearing Restrictions: No      Mobility  Bed Mobility Overal bed mobility: Needs Assistance Bed Mobility: Supine to Sit;Sit to Supine     Supine to sit: Mod assist Sit to supine: Min assist   General bed mobility comments: heavy use of bedrails; assist for truncal elevation  Transfers Overall transfer level: Needs assistance Equipment used: Rolling walker (2 wheeled) Transfers: Sit to/from Stand Sit to Stand: Min assist         General transfer comment: heavy min assist with use of posterior surface of LEs against edge of bed to  assist with lift off and stabilization  Ambulation/Gait Ambulation/Gait assistance: Min assist Ambulation Distance (Feet): 220 Feet Assistive device: Rolling walker (2 wheeled)   Gait velocity: 10' walk time, 12-13 seconds; signficant decrease compared to norms-indicative of high fall risk.   General Gait Details: inconsistent step height/length bilat, deteriorating with fatigue.  Maintains L LE in excessive ER throughout gait cycle.  Forward trunk flexion, constant cuing for walker position/management, tends to list arms length anterior to patient.  Poor ability to respond to external pertrubation; high fall risk.  Stairs            Wheelchair Mobility    Modified Rankin (Stroke Patients Only)       Balance Overall balance assessment: Needs assistance Sitting-balance support: No upper extremity supported;Feet supported Sitting balance-Leahy Scale: Fair   Postural control: Posterior lean Standing balance support: Bilateral upper extremity supported Standing balance-Leahy Scale: Fair                               Pertinent Vitals/Pain Pain Assessment: Faces Faces Pain Scale: Hurts little more Pain Location: back Pain Descriptors / Indicators: Sore Pain Intervention(s): Limited activity within patient's tolerance;Monitored during session;Repositioned    Home Living Family/patient expects to be discharged to:: Private residence Living Arrangements: Children Available Help at Discharge: Family;Available 24 hours/day Type of Home: House Home Access: Stairs to enter Entrance Stairs-Rails: LawyerLeft;Right Entrance Stairs-Number of Steps: 1 Home Layout: One level Home Equipment: Walker - 2 wheels;Walker - 4 wheels      Prior Function Level of Independence: Independent with assistive device(s)  Comments: Mod indep with ADLs (sponge bathes) and household mobility with RW.  daughter assists with shower 1-2x/week as able.  Does endorse 2-3 falls within  previous six months.  Daughters do endorse progressive decline in functional status in recent weeks     Hand Dominance        Extremity/Trunk Assessment   Upper Extremity Assessment Upper Extremity Assessment:  (chronic deficits in bilat shoulder elevation, R > L)    Lower Extremity Assessment Lower Extremity Assessment: Generalized weakness (grossly at least 4-/5, not tested with resistance secondary to R LE wound)       Communication   Communication: HOH  Cognition Arousal/Alertness: Awake/alert Behavior During Therapy: WFL for tasks assessed/performed Overall Cognitive Status: Within Functional Limits for tasks assessed                      General Comments      Exercises     Assessment/Plan    PT Assessment Patient needs continued PT services  PT Problem List Decreased strength;Decreased range of motion;Decreased activity tolerance;Decreased balance;Decreased mobility;Decreased knowledge of use of DME;Decreased cognition;Decreased safety awareness;Decreased knowledge of precautions;Cardiopulmonary status limiting activity;Decreased skin integrity          PT Treatment Interventions DME instruction;Gait training;Functional mobility training;Therapeutic activities;Balance training;Therapeutic exercise;Patient/family education    PT Goals (Current goals can be found in the Care Plan section)  Acute Rehab PT Goals Patient Stated Goal: to get to rehab/nursing home per daughters PT Goal Formulation: With patient/family Time For Goal Achievement: 09/04/16 Potential to Achieve Goals: Good    Frequency Min 2X/week   Barriers to discharge Decreased caregiver support      Co-evaluation               End of Session Equipment Utilized During Treatment: Gait belt Activity Tolerance: Patient limited by fatigue Patient left: in bed;with call bell/phone within reach;with bed alarm set;with family/visitor present           Time: [id number] PT Time  Calculation (min) (ACUTE ONLY): 21 min   Charges:   PT Evaluation $PT Eval Low Complexity: 1 Procedure     PT G Codes:        Trayvond Viets H. Manson PasseyBrown, PT, DPT, NCS 08/21/16, 12:47 PM [phone]

## 2016-08-22 DIAGNOSIS — L0390 Cellulitis, unspecified: Secondary | ICD-10-CM | POA: Diagnosis not present

## 2016-08-22 DIAGNOSIS — E119 Type 2 diabetes mellitus without complications: Secondary | ICD-10-CM | POA: Diagnosis not present

## 2016-08-22 DIAGNOSIS — G9340 Encephalopathy, unspecified: Secondary | ICD-10-CM | POA: Diagnosis not present

## 2016-08-22 LAB — GLUCOSE, CAPILLARY
GLUCOSE-CAPILLARY: 90 mg/dL (ref 65–99)
Glucose-Capillary: 110 mg/dL — ABNORMAL HIGH (ref 65–99)
Glucose-Capillary: 226 mg/dL — ABNORMAL HIGH (ref 65–99)
Glucose-Capillary: 176 mg/dL — ABNORMAL HIGH (ref 65–99)

## 2016-08-22 LAB — PROTIME-INR
INR: 2.28
Prothrombin Time: 25.5 seconds — ABNORMAL HIGH (ref 11.4–15.2)

## 2016-08-22 LAB — URINE CULTURE: Culture: NO GROWTH

## 2016-08-22 MED ORDER — WARFARIN SODIUM 5 MG PO TABS
2.5000 mg | ORAL_TABLET | ORAL | Status: DC
  Administered 2016-08-23: 17:00:00 2.5 mg via ORAL
  Filled 2016-08-22: qty 1

## 2016-08-22 MED ORDER — WARFARIN SODIUM 5 MG PO TABS
5.0000 mg | ORAL_TABLET | ORAL | Status: DC
  Administered 2016-08-22: 5 mg via ORAL
  Filled 2016-08-22: qty 1

## 2016-08-22 NOTE — Care Management (Signed)
Patient lives with her daughter who says that care demands are increasing for this patient in the home.  Patient has demntia but had become more confused.  Thought to be due to acute infection of cellulitis and UTI.  Has a walker at home.  Requires supervision to perform adls. Denies issues accessing medical care, obtaining medications or with transportation.  Current with  PCP.  No services being provided in the home.Marland Kitchen.  Has never had home health.  Physical therapy has recommended skilled nursing placement and daughter Natalie Rogers is in agreement.  Discussed it would require approval from her insurance company.  If for some reason this would not be approved, agreeable to home health nurse, PT, aide and SW- for community resources for caregivers of patients with dementia

## 2016-08-22 NOTE — Progress Notes (Signed)
[facility] - [place] at [facility]   PATIENT NAME: Natalie CooterDorothy Rogers    MR#:  [id number]  DATE OF BIRTH:  1923/08/27  SUBJECTIVE: Admitted for altered mental status, found to have worsening right leg cellulitis, UTI.     No Overnight symptoms. Physical therapy recommended skilled nursing facility.   CHIEF COMPLAINT:   Chief Complaint  Patient presents with  . Altered Mental Status    REVIEW OF SYSTEMS:   ROS CONSTITUTIONAL: No fever, fatigue or weakness.  EYES: No blurred or double vision.  EARS, NOSE, AND THROAT: No tinnitus or ear pain.  RESPIRATORY: No cough, shortness of breath, wheezing or hemoptysis.  CARDIOVASCULAR: No chest pain, orthopnea, edema.  GASTROINTESTINAL: No nausea, vomiting, diarrhea or abdominal pain.  GENITOURINARY: No dysuria, hematuria.  ENDOCRINE: No polyuria, nocturia,  HEMATOLOGY: No anemia, easy bruising or bleeding SKIN: No rash or lesion. MUSCULOSKELETAL: No joint pain or arthritis.   NEUROLOGIC: No tingling, numbness, weakness.  PSYCHIATRY: No anxiety or depression.   DRUG ALLERGIES:   Allergies  Allergen Reactions  . Penicillins Anaphylaxis, Rash and Other (See Comments)    Has patient had a PCN reaction causing immediate rash, facial/tongue/throat swelling, SOB or lightheadedness with hypotension: Yes Has patient had a PCN reaction causing severe rash involving mucus membranes or skin necrosis: No Has patient had a PCN reaction that required hospitalization No Has patient had a PCN reaction occurring within the last 10 years: No If all of the above answers are "NO", then may proceed with Cephalosporin use.  . Codeine   . Demerol [Meperidine] Rash    VITALS:  Blood pressure (!) 186/60, pulse (!) 42, temperature 97.4 F (36.3 C), temperature source Oral, resp. rate 16, height 5\' 5"  (1.651 m), weight 72.9 kg (160 lb 12.8 oz), SpO2 100 %.  PHYSICAL EXAMINATION:  GENERAL:  83 y.o.-year-old patient lying in the bed  with no acute distress.  EYES: Pupils equal, round, reactive to light and accommodation. No scleral icterus. Extraocular muscles intact.  HEENT: Head atraumatic, normocephalic. Oropharynx and nasopharynx clear.  NECK:  Supple, no jugular venous distention. No thyroid enlargement, no tenderness.  LUNGS: Normal breath sounds bilaterally, no wheezing, rales,rhonchi or crepitation. No use of accessory muscles of respiration.  CARDIOVASCULAR: S1, S2 normal. No murmurs, rubs, or gallops.  ABDOMEN: Soft, nontender, nondistended. Bowel sounds present. No organomegaly or mass.  EXTREMITIES noted to have purple discoloration, wound to the right medial part of the leg, no drainage .also has some small dry areas of tissue loss. NEUROLOGIC: Cranial nerves II through XII are intact. Muscle strength 5/5 in all extremities. Sensation intact. Gait not checked.  PSYCHIATRIC: The patient is alert and oriented x 3.  SKIN: No obvious rash, lesion, or ulcer.    LABORATORY PANEL:   CBC  Recent Labs Lab 08/21/16 0434  WBC 7.9  HGB 11.3*  HCT 32.5*  PLT 210   ------------------------------------------------------------------------------------------------------------------  Chemistries   Recent Labs Lab 08/20/16 1248 08/21/16 0434  NA 132* 136  K 4.6 4.1  CL 99* 102  CO2 25 27  GLUCOSE 233* 63*  BUN 13 14  CREATININE 0.92 0.84  CALCIUM 8.9 8.8*  AST 25  --   ALT 14  --   ALKPHOS 47  --   BILITOT 0.5  --    ------------------------------------------------------------------------------------------------------------------  Cardiac Enzymes No results for input(s): TROPONINI in the last 168 hours. ------------------------------------------------------------------------------------------------------------------  RADIOLOGY:  Ct Head Wo Contrast  Result Date: 08/20/2016 CLINICAL DATA:  Recent UTI,  confusion and hallucinations, fall from bed last night, pt is on coumadin EXAM: CT HEAD WITHOUT  CONTRAST CT CERVICAL SPINE WITHOUT CONTRAST TECHNIQUE: Multidetector CT imaging of the head and cervical spine was performed following the standard protocol without intravenous contrast. Multiplanar CT image reconstructions of the cervical spine were also generated. COMPARISON:  01/04/2016 FINDINGS: CT HEAD FINDINGS Brain: No evidence of acute infarction, hemorrhage, hydrocephalus, extra-axial collection or mass lesion/mass effect. There is ventricular and sulcal enlargement reflecting moderate atrophy. White matter hypoattenuation is noted consistent with extensive chronic microvascular ischemic change. Vascular: No hyperdense vessel or unexpected calcification. Skull: Normal. Negative for fracture or focal lesion. Sinuses/Orbits: Globes orbits are unremarkable. Visualized sinuses and mastoid air cells are clear. Other: None. CT CERVICAL SPINE FINDINGS Alignment: Normal. Skull base and vertebrae: No acute fracture. No primary bone lesion or focal pathologic process. Soft tissues and spinal canal: No prevertebral fluid or swelling. No visible canal hematoma. 9 mm right thyroid lobe nodule, stable. Disc levels: There are stable disc and facet degenerative changes throughout the cervical spine with generally mild degrees of neural foraminal narrowing and mild narrowing of the mid to lower central spinal canal, stable. Upper chest: Unremarkable Other: None IMPRESSION: HEAD CT: No acute intracranial abnormalities. No skull fracture. Atrophy and extensive chronic microvascular ischemic change. CERVICAL CT:  No fracture or acute finding. Electronically Signed   By: Amie Portland M.D.   On: 08/20/2016 13:37   Ct Cervical Spine Wo Contrast  Result Date: 08/20/2016 CLINICAL DATA:  Recent UTI, confusion and hallucinations, fall from bed last night, pt is on coumadin EXAM: CT HEAD WITHOUT CONTRAST CT CERVICAL SPINE WITHOUT CONTRAST TECHNIQUE: Multidetector CT imaging of the head and cervical spine was performed following  the standard protocol without intravenous contrast. Multiplanar CT image reconstructions of the cervical spine were also generated. COMPARISON:  01/04/2016 FINDINGS: CT HEAD FINDINGS Brain: No evidence of acute infarction, hemorrhage, hydrocephalus, extra-axial collection or mass lesion/mass effect. There is ventricular and sulcal enlargement reflecting moderate atrophy. White matter hypoattenuation is noted consistent with extensive chronic microvascular ischemic change. Vascular: No hyperdense vessel or unexpected calcification. Skull: Normal. Negative for fracture or focal lesion. Sinuses/Orbits: Globes orbits are unremarkable. Visualized sinuses and mastoid air cells are clear. Other: None. CT CERVICAL SPINE FINDINGS Alignment: Normal. Skull base and vertebrae: No acute fracture. No primary bone lesion or focal pathologic process. Soft tissues and spinal canal: No prevertebral fluid or swelling. No visible canal hematoma. 9 mm right thyroid lobe nodule, stable. Disc levels: There are stable disc and facet degenerative changes throughout the cervical spine with generally mild degrees of neural foraminal narrowing and mild narrowing of the mid to lower central spinal canal, stable. Upper chest: Unremarkable Other: None IMPRESSION: HEAD CT: No acute intracranial abnormalities. No skull fracture. Atrophy and extensive chronic microvascular ischemic change. CERVICAL CT:  No fracture or acute finding. Electronically Signed   By: Amie Portland M.D.   On: 08/20/2016 13:37    EKG:   Orders placed or performed during the hospital encounter of 01/04/16  . ED EKG  . ED EKG    ASSESSMENT AND PLAN:   1 altered mental status likely secondary to combination of advanced age,, right leg cellulitis: Seen by wound care nurse, continue wrapping and elevation of the right leg. Continue clindamycin, meropenem, blood cultures have been negative so far. No  Fever,. WBC normal. De-escalate the antibiotics tomorrow.  #2  hyperkalemia improved, likely due to combination of Bactrim,' advanced age/   #3.  COPD: No wheezing  #4 history of DVT in the right leg, history of traumas to the right leg: Continue blood thinners Acardia, patient not on beta blockers. Monitor on telemetry.  #4 GERD continue PPIs  #5 history of PE. PE: Continue chronic anticoagulation  #6 diabetes mellitus type 2: Continue home medication. #7 dementia with advanced age;  Code partial code \ #7 deconditioning: Physical therapy recommended skilled nursing facility.  All the records are reviewed and case discussed with Care Management/Social Workerr. Management plans discussed with the patient, family and they are in agreement.  CODE STATUS: partial  TOTAL TIME TAKING CARE OF THIS PATIENT: .   POSSIBLE D/C IN 1-2DAYS, DEPENDING ON CLINICAL CONDITION.   Katha Hamming M.D on 08/22/2016 at 8:51 AM  Between 7am to 6pm - Pager - [phone]  After 6pm go to [email or web address] - password [email or web address] [facility]  [place] [place] [facility]  Office  [phone]  CC: Primary care physician; [facility], Madaline Guthrie, MD   Note: This dictation was prepared with Dragon dictation along with smaller phrase technology. Any transcriptional errors that result from this process are unintentional.

## 2016-08-22 NOTE — NC FL2 (Signed)
[place] MEDICAID FL2 LEVEL OF CARE SCREENING TOOL     IDENTIFICATION  Patient Name: MILLISSA DEESE Birthdate: 01/25/1924 Sex: female Admission Date (Current Location): 08/20/2016  [place] and [place] Number:  Chiropodist and Address:  [facility], [address], [place], [place] [address]      Provider Number: [id number]  Attending Physician Name and Address:  Katha Hamming, MD  Relative Name and Phone Number:       Current Level of Care: Hospital Recommended Level of Care: Skilled Nursing Facility Prior Approval Number:    Date Approved/Denied:   PASRR Number:   [id number]   Discharge Plan: SNF    Current Diagnoses: Patient Active Problem List   Diagnosis Date Noted  . Cellulitis 08/20/2016  . Lower back pain 01/06/2016  . DDD (degenerative disc disease), lumbar 01/06/2016  . Elevated troponin 01/06/2016  . Leukocytosis 01/06/2016  . Type 2 diabetes mellitus (HCC) 01/04/2016  . Acute encephalopathy 01/04/2016  . Acute pyelonephritis 01/04/2016  . HTN (hypertension) 01/04/2016  . HLD (hyperlipidemia) 01/04/2016  . GERD (gastroesophageal reflux disease) 01/04/2016    Orientation RESPIRATION BLADDER Height & Weight     Self, Place  Normal Incontinent Weight: 160 lb 12.8 oz (72.9 kg) Height:  5\' 5"  (165.1 cm)  BEHAVIORAL SYMPTOMS/MOOD NEUROLOGICAL BOWEL NUTRITION STATUS   (none)  (none) Continent Diet (Diet: Carb Modified )  AMBULATORY STATUS COMMUNICATION OF NEEDS Skin   Extensive Assist Verbally Other (Comment) (non-pressure wound on right leg )                       Personal Care Assistance Level of Assistance  Bathing, Feeding, Dressing Bathing Assistance: Limited assistance Feeding assistance: Independent Dressing Assistance: Limited assistance     Functional Limitations Info  Sight, Hearing, Speech Sight Info: Adequate Hearing Info: Adequate Speech Info: Adequate    SPECIAL CARE FACTORS  FREQUENCY  PT (By licensed PT), OT (By licensed OT)     PT Frequency:  (5) OT Frequency:  (5)            Contractures      Additional Factors Info  Code Status, Allergies, Insulin Sliding Scale Code Status Info:  (Partial Code (Yes to everything, No to Intubation/Mechanical Ventilation)) Allergies Info:  (Penicillins, Codeine, Demerol Meperidine)   Insulin Sliding Scale Info:  (NovoKLog Insulin Injections )       Current Medications (08/22/2016):  This is the current hospital active medication list Current Facility-Administered Medications  Medication Dose Route Frequency Provider Last Rate Last Dose  . 0.9 %  sodium chloride infusion  250 mL Intravenous PRN Milagros Loll, MD      . acetaminophen (TYLENOL) tablet 650 mg  650 mg Oral Q6H PRN Milagros Loll, MD       Or  . acetaminophen (TYLENOL) suppository 650 mg  650 mg Rectal Q6H PRN Srikar Sudini, MD      . albuterol (PROVENTIL) (2.5 MG/3ML) 0.083% nebulizer solution 2.5 mg  2.5 mg Nebulization Q2H PRN Srikar Sudini, MD      . bisacodyl (DULCOLAX) suppository 10 mg  10 mg Rectal Daily PRN Srikar Sudini, MD      . docusate sodium (COLACE) capsule 100 mg  100 mg Oral BID Milagros Loll, MD   100 mg at 08/22/16 0953  . feeding supplement (GLUCERNA SHAKE) (GLUCERNA SHAKE) liquid 237 mL  237 mL Oral TID BM Srikar Sudini, MD   237 mL at 08/22/16 0957  .  glipiZIDE (GLUCOTROL XL) 24 hr tablet 5 mg  5 mg Oral BID WC Katha HammingSnehalatha Konidena, MD   5 mg at 08/22/16 0800  . hydrALAZINE (APRESOLINE) injection 10 mg  10 mg Intravenous Q6H PRN Srikar Sudini, MD      . ibuprofen (ADVIL,MOTRIN) tablet 400 mg  400 mg Oral Q6H PRN Srikar Sudini, MD      . insulin aspart (novoLOG) injection 0-5 Units  0-5 Units Subcutaneous QHS Srikar Sudini, MD      . insulin aspart (novoLOG) injection 0-9 Units  0-9 Units Subcutaneous TID WC Milagros LollSrikar Sudini, MD   3 Units at 08/22/16 1317  . lidocaine (LIDODERM) 5 % 1 patch  1 patch Transdermal Q24H Milagros LollSrikar Sudini, MD   1  patch at 08/22/16 0800  . lisinopril (PRINIVIL,ZESTRIL) tablet 40 mg  40 mg Oral Daily Milagros LollSrikar Sudini, MD   40 mg at 08/22/16 0953  . meropenem (MERREM) IVPB SOLR 1 g  1 g Intravenous Q12H Sheema M Hallaji, RPH   1 g at 08/22/16 0553  . metFORMIN (GLUCOPHAGE-XR) 24 hr tablet 500 mg  500 mg Oral BID Milagros LollSrikar Sudini, MD   500 mg at 08/22/16 0953  . metoprolol succinate (TOPROL-XL) 24 hr tablet 25 mg  25 mg Oral Daily Milagros LollSrikar Sudini, MD   25 mg at 08/22/16 0958  . ondansetron (ZOFRAN) tablet 4 mg  4 mg Oral Q6H PRN Milagros LollSrikar Sudini, MD       Or  . ondansetron (ZOFRAN) injection 4 mg  4 mg Intravenous Q6H PRN Srikar Sudini, MD      . pantoprazole (PROTONIX) EC tablet 40 mg  40 mg Oral Daily Milagros LollSrikar Sudini, MD   40 mg at 08/22/16 0953  . polyethylene glycol (MIRALAX / GLYCOLAX) packet 17 g  17 g Oral Daily PRN Srikar Sudini, MD      . pravastatin (PRAVACHOL) tablet 40 mg  40 mg Oral QHS Milagros LollSrikar Sudini, MD   40 mg at 08/21/16 2046  . sodium chloride flush (NS) 0.9 % injection 3 mL  3 mL Intravenous Q12H Milagros LollSrikar Sudini, MD   3 mL at 08/22/16 0959  . sodium chloride flush (NS) 0.9 % injection 3 mL  3 mL Intravenous PRN Srikar Sudini, MD      . traZODone (DESYREL) tablet 25 mg  25 mg Oral QHS Milagros LollSrikar Sudini, MD   25 mg at 08/21/16 2046  . [START ON 08/23/2016] warfarin (COUMADIN) tablet 2.5 mg  2.5 mg Oral Once per day on Sun Tue Wed Fri Sat Cindi CarbonMary M Swayne, [facility]      . warfarin (COUMADIN) tablet 5 mg  5 mg Oral Once per day on Mon Thu Jodelle RedMary M [place] CitySwayne, [facility]      . Warfarin - Pharmacist Dosing Inpatient   Does not apply q1800 Gardner CandleSheema M Hallaji, [facility] LPRPH         Discharge Medications: Please see discharge summary for a list of discharge medications.  Relevant Imaging Results:  Relevant Lab Results:   Additional Information  (SSN: [id number])  Mckenna Gamm, Darleen CrockerBailey M, LCSW

## 2016-08-22 NOTE — Clinical Social Work Placement (Addendum)
   CLINICAL SOCIAL WORK PLACEMENT  NOTE  Date:  08/22/2016  Patient Details  Name: Natalie Rogers MRN: [id number] Date of Birth: 04/21/1924  Clinical Social Work is seeking post-discharge placement for this patient at the Skilled  Nursing Facility level of care (*CSW will initial, date and re-position this form in  chart as items are completed):  Yes   Patient/family provided with [place] Clinical Social Work Department's list of facilities offering this level of care within the geographic area requested by the patient (or if unable, by the patient's family).  Yes   Patient/family informed of their freedom to choose among providers that offer the needed level of care, that participate in Medicare, Medicaid or managed care program needed by the patient, have an available bed and are willing to accept the patient.  Yes   Patient/family informed of [facility]'s ownership interest in [facility] and [facility], as well as of the fact that they are under no obligation to receive care at these facilities.  PASRR submitted to EDS on 08/22/16     PASRR number received on 08/22/16     Existing PASRR number confirmed on       FL2 transmitted to all facilities in geographic area requested by pt/family on 08/22/16     FL2 transmitted to all facilities within larger geographic area on       Patient informed that his/her managed care company has contracts with or will negotiate with certain facilities, including the following:         08-24-15   Patient/family informed of bed offers received. (Updated Windell MouldingEric Leland Raver, MSW, Theresia MajorsLCSWA, 08-25-15)  [facility] (Updated Windell MouldingEric Raeden Schippers, MSW, [place]CSWA, 08-25-15)    Physician recommends and patient chooses bed at      [facility]  [facility] on  08-25-15. (Updated Windell MouldingEric Cadience Bradfield, MSW, Theresia MajorsLCSWA, 08-25-15)  Patient to be transferred to facility by  Daughter's car (Updated Windell MouldingEric Leliana Kontz, MSW, LCSWA, 08-25-15)      Patient family notified on  08-25-15 of transfer. (Updated Windell MouldingEric Arrion Broaddus, MSW, LCSWA, 08-25-15)  Name of family member notified:   Daughter at bedside (Updated Windell MouldingEric Yaxiel Minnie, MSW, LCSWA, 08-25-15)     PHYSICIAN Please sign FL2     Additional Comment:    _______________________________________________ Darleene CleaverAnterhaus, Shihab States R, LCSWA 08/22/2016, 5:10 PM

## 2016-08-22 NOTE — Clinical Social Work Note (Signed)
Clinical Social Work Assessment  Patient Details  Name: Natalie Rogers MRN: [id number] Date of Birth: [birth year]  Date of referral:  08/22/16               Reason for consult:  Facility Placement                Permission sought to share information with:  Family Supports, Magazine features editoracility Contact Representative Permission granted to share information::  Yes, Verbal Permission Granted  Name::     Ardean LarsenWard,[name] Daughter [phone] or Clent DemarkBailey,Jeff Son [phone] or Lula OlszewskiMoore,Lisa Daughter [phone]   Agency::  SNF admissions  Relationship::     Contact Information:     Housing/Transportation Living arrangements for the past 2 months:  Single Family Home Source of Information:  Patient, Adult Children Patient Interpreter Needed:  None Criminal Activity/Legal Involvement Pertinent to Current Situation/Hospitalization:  No - Comment as needed Significant Relationships:  Adult Children Lives with:  Adult Children Do you feel safe going back to the place where you live?  No Need for family participation in patient care:  Yes (Comment)  Care giving concerns:  Patient and family feels she needs short term rehab before she is able to return back home.   Social Worker assessment / plan:  Patient is a 83 year old female who has some confusion, patient's daughter was at bedside, CSW completed assessmet by speaking to patient's daughter.  Patient's daughter reports that she has been to rehab in the past and she was familiar with the process.  CSW spoke to patient' daughter and explained to her how insurance [facility] and what to expect.  CSW explained to patient's daughter the process of how CSW will begin bed search.  Patient's [place] expressed interest in [place] well [place], and gave CSW permission to begin bed search.  Patient's daughter did not express any other questions or concerns.                                  Employment status:  Retired Glass blower/designernsurance  information:  Managed Medicare PT Recommendations:  Skilled Nursing Facility Information / Referral to community resources:  Skilled Nursing Facility  Patient/Family's Response to care:  Patient and family agreeable to going to SNF for rehab.  Patient/Family's Understanding of and Emotional Response to Diagnosis, Current Treatment, and Prognosis:  Patient's daughter feels patient may need to stay at SNF as a long term care resident, but she is hopeful patient will be strong enough to return back home.  Emotional Assessment Appearance:  Appears stated age Attitude/Demeanor/Rapport:    Affect (typically observed):  Appropriate, Calm Orientation:  Oriented to Self Alcohol / Substance use:  Never Used Psych involvement (Current and /or in the community):  No (Comment)  Discharge Needs  Concerns to be addressed:  Lack of Support Readmission within the last 30 days:  No Current discharge risk:  Lack of support system Barriers to Discharge:  Continued Medical Work up   [place] Const[place], Shilee Biggs R, LCSWA 08/22/2016, 5:02 PM

## 2016-08-22 NOTE — Progress Notes (Signed)
ANTICOAGULATION CONSULT NOTE - Follow Up  Pharmacy Consult for warfarin  Indication: atrial fibrillation  Allergies  Allergen Reactions  . Penicillins Anaphylaxis, Rash and Other (See Comments)    Has patient had a PCN reaction causing immediate rash, facial/tongue/throat swelling, SOB or lightheadedness with hypotension: Yes Has patient had a PCN reaction causing severe rash involving mucus membranes or skin necrosis: No Has patient had a PCN reaction that required hospitalization No Has patient had a PCN reaction occurring within the last 10 years: No If all of the above answers are "NO", then may proceed with Cephalosporin use.  . Codeine   . Demerol [Meperidine] Rash   Patient Measurements: Height: 5\' 5"  (165.1 cm) Weight: 160 lb 12.8 oz (72.9 kg) IBW/kg (Calculated) : 57  Vital Signs: Temp: 97.4 F (36.3 C) (01/01 0818) Temp Source: Oral (01/01 0818) BP: 186/60 (01/01 0818) Pulse Rate: 42 (01/01 0818)  Labs:  Recent Labs  08/20/16 1247 08/20/16 1248 08/21/16 0434 08/22/16 0444  HGB  --  12.2 11.3*  --   HCT  --  36.1 32.5*  --   PLT  --  232 210  --   LABPROT 25.4*  --  30.4* 25.5*  INR 2.27  --  2.84 2.28  CREATININE  --  0.92 0.84  --     Estimated Creatinine Clearance: 44.6 mL/min (by C-G formula based on SCr of 0.84 mg/dL).   Medical History: Past Medical History:  Diagnosis Date  . COPD (chronic obstructive pulmonary disease) (HCC)   . DVT (deep venous thrombosis) (HCC)   . GERD (gastroesophageal reflux disease)   . HLD (hyperlipidemia)   . HTN (hypertension)   . PE (pulmonary embolism)    on chronic anticoagulation  . Type 2 diabetes mellitus [facility](HCC)     Assessment: 83 yo female with PMH of atrial fibrillation admitted with cellulitis. Pharmacy consulted for warfarin management while inpatient.   Home Dose: Warfarin 5 mg: Mon, Thurs                       Warfarin 2.5 mg: Sun, Tues, Weds, Fri, Sat   DATE  INR  DOSE 12/30  2.27  5mg  12/31                2.84  5 mg 1/1  2.28   Goal of Therapy:  INR 2-3 Monitor platelets by anticoagulation protocol: Yes   Plan:  INR remains therapeutic.   Will resume patient's home dose of warfarin 5 mg PO Monday and Thursday and 2.5 mg PO all other days of the week.   INR to be checked with AM labs tomorrow  Cindi CarbonMary M Sosie Gato, PharmD, BCPS Clinical Pharmacist 08/22/2016 10:12 AM

## 2016-08-23 DIAGNOSIS — G9340 Encephalopathy, unspecified: Secondary | ICD-10-CM | POA: Diagnosis not present

## 2016-08-23 DIAGNOSIS — L0390 Cellulitis, unspecified: Secondary | ICD-10-CM | POA: Diagnosis not present

## 2016-08-23 DIAGNOSIS — E119 Type 2 diabetes mellitus without complications: Secondary | ICD-10-CM | POA: Diagnosis not present

## 2016-08-23 LAB — GLUCOSE, CAPILLARY
GLUCOSE-CAPILLARY: 42 mg/dL — AB (ref 65–99)
GLUCOSE-CAPILLARY: 152 mg/dL — AB (ref 65–99)
GLUCOSE-CAPILLARY: 109 mg/dL — AB (ref 65–99)
Glucose-Capillary: 58 mg/dL — ABNORMAL LOW (ref 65–99)
Glucose-Capillary: 89 mg/dL (ref 65–99)
Glucose-Capillary: 128 mg/dL — ABNORMAL HIGH (ref 65–99)

## 2016-08-23 LAB — HEMOGLOBIN A1C
HEMOGLOBIN A1C: 6.4 % — AB (ref 4.8–5.6)
MEAN PLASMA GLUCOSE: 137 mg/dL

## 2016-08-23 LAB — C DIFFICILE QUICK SCREEN W PCR REFLEX
C DIFFICLE (CDIFF) ANTIGEN: NEGATIVE
C Diff interpretation: NOT DETECTED
C Diff toxin: NEGATIVE

## 2016-08-23 LAB — PROTIME-INR
INR: 2.27
Prothrombin Time: 25.4 seconds — ABNORMAL HIGH (ref 11.4–15.2)

## 2016-08-23 MED ORDER — CEPHALEXIN 250 MG PO CAPS
250.0000 mg | ORAL_CAPSULE | Freq: Four times a day (QID) | ORAL | Status: DC
  Administered 2016-08-23 – 2016-08-24 (×3): 250 mg via ORAL
  Filled 2016-08-23 (×3): qty 1

## 2016-08-23 NOTE — Clinical Social Work Note (Addendum)
CSW presented bed offers to patient and her daughter, they have [facility] [place].  [facility] and they can accept patient once insurance has been approved.  CSW to continue to follow patient's progress throughout discharge planning.  5:30pm  CSW has received insurance authorization for patient to go to SNF.  Authorization number is [id number].  Ervin KnackEric R. Mir Fullilove, MSW, Theresia MajorsLCSWA [phone]  Mon-Fri 8a-4:30p 08/23/2016 1:39 PM

## 2016-08-23 NOTE — Progress Notes (Signed)
ANTICOAGULATION CONSULT NOTE - Follow Up  Pharmacy Consult for warfarin  Indication: atrial fibrillation  Allergies  Allergen Reactions  . Penicillins Anaphylaxis, Rash and Other (See Comments)    Has patient had a PCN reaction causing immediate rash, facial/tongue/throat swelling, SOB or lightheadedness with hypotension: Yes Has patient had a PCN reaction causing severe rash involving mucus membranes or skin necrosis: No Has patient had a PCN reaction that required hospitalization No Has patient had a PCN reaction occurring within the last 10 years: No If all of the above answers are "NO", then may proceed with Cephalosporin use.  . Codeine   . Demerol [Meperidine] Rash   Patient Measurements: Height: 5\' 5"  (165.1 cm) Weight: 163 lb 4.8 oz (74.1 kg) IBW/kg (Calculated) : 57  Vital Signs: Temp: 99.1 F (37.3 C) (01/02 0437) Temp Source: Axillary (01/02 0437) BP: 125/53 (01/02 0855) Pulse Rate: 70 (01/02 0855)  Labs:  Recent Labs  08/20/16 1248 08/21/16 0434 08/22/16 0444 08/23/16 0542  HGB 12.2 11.3*  --   --   HCT 36.1 32.5*  --   --   PLT 232 210  --   --   LABPROT  --  30.4* 25.5* 25.4*  INR  --  2.84 2.28 2.27  CREATININE 0.92 0.84  --   --     Estimated Creatinine Clearance: 44.8 mL/min (by C-G formula based on SCr of 0.84 mg/dL).   Medical History: Past Medical History:  Diagnosis Date  . COPD (chronic obstructive pulmonary disease) (HCC)   . DVT (deep venous thrombosis) (HCC)   . GERD (gastroesophageal reflux disease)   . HLD (hyperlipidemia)   . HTN (hypertension)   . PE (pulmonary embolism)    on chronic anticoagulation  . Type 2 diabetes mellitus [facility](HCC)     Assessment: 83 yo female with PMH of atrial fibrillation admitted with cellulitis. Pharmacy consulted for warfarin management while inpatient.   Home Dose: Warfarin 5 mg: Mon, Thurs                       Warfarin 2.5 mg: Sun, Tues, Weds, Fri, Sat   DATE  INR  DOSE 12/30  2.27  5mg  12/31                2.84  5 mg 1/1  2.28  5mg  1/2  2.27   Goal of Therapy:  INR 2-3 Monitor platelets by anticoagulation protocol: Yes   Plan:  INR remains therapeutic.   Will resume patient's home dose of warfarin 5 mg PO Monday and Thursday and 2.5 mg PO all other days of the week.   INR to be checked with AM labs tomorrow  Martyn MalayBarefoot,Cyndal Kasson C, PharmD, BCPS Clinical Pharmacist 08/23/2016 9:20 AM

## 2016-08-23 NOTE — Progress Notes (Addendum)
[facility] - [employer] at [facility]   PATIENT NAME: Natalie Rogers    MR#:  [id number]  DATE OF BIRTH:  Dec 10, 1923  SUBJECTIVE: Admitted for altered mental status, found to have worsening right leg cellulitis, UTI.     Diarrhea today, check stool for C. difficile. No fever. No abdominal pain.   CHIEF COMPLAINT:   Chief Complaint  Patient presents with  . Altered Mental Status    REVIEW OF SYSTEMS:   ROS CONSTITUTIONAL: No fever, fatigue or weakness.  EYES: No blurred or double vision.  EARS, NOSE, AND THROAT: No tinnitus or ear pain.  RESPIRATORY: No cough, shortness of breath, wheezing or hemoptysis.  CARDIOVASCULAR: No chest pain, orthopnea, edema.  GASTROINTESTINAL: No nausea or vomiting but diarrhea present GENITOURINARY: No dysuria, hematuria.  ENDOCRINE: No polyuria, nocturia,  HEMATOLOGY: No anemia, easy bruising or bleeding SKIN: No rash or lesion. MUSCULOSKELETAL: No joint pain or arthritis.   NEUROLOGIC: No tingling, numbness, weakness.  PSYCHIATRY: No anxiety or depression.   DRUG ALLERGIES:   Allergies  Allergen Reactions  . Penicillins Anaphylaxis, Rash and Other (See Comments)    Has patient had a PCN reaction causing immediate rash, facial/tongue/throat swelling, SOB or lightheadedness with hypotension: Yes Has patient had a PCN reaction causing severe rash involving mucus membranes or skin necrosis: No Has patient had a PCN reaction that required hospitalization No Has patient had a PCN reaction occurring within the last 10 years: No If all of the above answers are "NO", then may proceed with Cephalosporin use.  . Codeine   . Demerol [Meperidine] Rash    VITALS:  Blood pressure (!) 125/53, pulse 70, temperature 99.1 F (37.3 C), temperature source Axillary, resp. rate 20, height 5\' 5"  (1.651 m), weight 74.1 kg (163 lb 4.8 oz), SpO2 98 %.  PHYSICAL EXAMINATION:  GENERAL:  83 y.o.-year-old patient lying in the bed with no acute  distress.  EYES: Pupils equal, round, reactive to light and accommodation. No scleral icterus. Extraocular muscles intact.  HEENT: Head atraumatic, normocephalic. Oropharynx and nasopharynx clear.  NECK:  Supple, no jugular venous distention. No thyroid enlargement, no tenderness.  LUNGS: Normal breath sounds bilaterally, no wheezing, rales,rhonchi or crepitation. No use of accessory muscles of respiration.  CARDIOVASCULAR: S1, S2 normal. No murmurs, rubs, or gallops.  ABDOMEN: Soft, nontender, nondistended. Bowel sounds present. No organomegaly or mass.  EXTREMITIES noted to have purple discoloration, wound to the right medial part of the leg, no drainage .also has some small dry areas of tissue loss. NEUROLOGIC: Cranial nerves II through XII are intact. Muscle strength 5/5 in all extremities. Sensation intact. Gait not checked.  PSYCHIATRIC: The patient is alert and oriented x 3.  SKIN: Noted to have skin discoloration, erythema on the on the right leg. Rapid with dressing.    LABORATORY PANEL:   CBC  Recent Labs Lab 08/21/16 0434  WBC 7.9  HGB 11.3*  HCT 32.5*  PLT 210   ------------------------------------------------------------------------------------------------------------------  Chemistries   Recent Labs Lab 08/20/16 1248 08/21/16 0434  NA 132* 136  K 4.6 4.1  CL 99* 102  CO2 25 27  GLUCOSE 233* 63*  BUN 13 14  CREATININE 0.92 0.84  CALCIUM 8.9 8.8*  AST 25  --   ALT 14  --   ALKPHOS 47  --   BILITOT 0.5  --    ------------------------------------------------------------------------------------------------------------------  Cardiac Enzymes No results for input(s): TROPONINI in the last 168 hours. ------------------------------------------------------------------------------------------------------------------  RADIOLOGY:  No results found.  EKG:   Orders placed or performed during the hospital encounter of 01/04/16  . ED EKG  . ED EKG     ASSESSMENT AND PLAN:   1 altered mental status likely secondary to combination of advanced age,, right leg cellulitis: Seen by wound care nurse, continue wrapping and elevation of the right leg. Discontinue  CLINDAMYCIN. start by mouth Keflex, DC meropenem check the stool for C. difficile because of diarrhea.  #2 hyperkalemia improved, likely due to combination of Bactrim,' advanced age/   #3. COPD: No wheezing  #4 history of DVT in the right leg, history of traumas to the right leg: Continue blood thinners Acardia, patient not on beta blockers. Monitor on telemetry.  #4 GERD continue PPIs  #5 history of PE. PE: Continue chronic anticoagulation  #6 diabetes mellitus type 2: Hypoglycemia due to oral  DIABETICS,, discontinue glipizide but continue metformin. #7 dementia with advanced age; 8. essential hypertension: Controlled. Code partial code \ #7 deconditioning: Physical therapy recommended skilled nursing facility.  All the records are reviewed and case discussed with Care Management/Social Workerr. Management plans discussed with the patient, family and they are in agreement.  CODE STATUS: partial  TOTAL TIME TAKING CARE OF THIS PATIENT: 35minutes.   POSSIBLE D/C IN 1-2DAYS, DEPENDING ON CLINICAL CONDITION.   Katha HammingKONIDENA,Marcelo Ickes M.D on 08/23/2016 at 9:44 AM  Between 7am to 6pm - Pager - [phone]  After 6pm go to [email or web address] - password [email or web address] [facility]  [facility]  Office  [phone]  CC: Primary care physician; Bay[facility] University Medical CenterFELDPAUSCH, Madaline GuthrieALE E, MD   Note: This dictation was prepared with Dragon dictation along with smaller phrase technology. Any transcriptional errors that result from this process are unintentional.

## 2016-08-23 NOTE — Care Management Important Message (Signed)
Important Message  Patient Details  Name: Natalie Rogers MRN: [id number] Date of Birth: Jan 27, 1924   Medicare Important Message Given:  Yes    Gwenette GreetBrenda S Nycholas Rayner, RN 08/23/2016, 8:29 AM

## 2016-08-23 NOTE — Progress Notes (Signed)
Physical Therapy Treatment Patient Details Name: Natalie Rogers MRN: [id number] DOB: 24-Sep-1923 Today's Date: 08/23/2016    History of Present Illness presented to ER status post fall, worsening AMS and lethargy; admitted with R LE cellulitis, recurrent UTI.    PT Comments    Attempted to see pt earlier this AM and she needed a clean up after losing control of her bladder, came back ~20 minutes later (after being cleaned up) and on rising to standing it was obvious that pt had just had a bowel movement that she was unaware of.  Deferred longer distance ambulation, though as pt was fatigued and weak with the limited ambulation we did (~30 ft) she did not seem able/confident to do as much as she did last PT visit.    Follow Up Recommendations  SNF     Equipment Recommendations       Recommendations for Other Services       Precautions / Restrictions Precautions Precautions: Fall Restrictions Weight Bearing Restrictions: No    Mobility  Bed Mobility Overal bed mobility: Needs Assistance Bed Mobility: Supine to Sit;Sit to Supine     Supine to sit: Min assist Sit to supine: Mod assist   General bed mobility comments: Pt needed assist to get to fully upright and scoot hips to EOB, unable to lift LEs back into bed needing direct assist   Transfers Overall transfer level: Needs assistance Equipment used: Rolling walker (2 wheeled) Transfers: Sit to/from Stand Sit to Stand: Min assist;Mod assist         General transfer comment: Pt shows good effort trying to get to sitting, but ultimately is very weak and needs assist to shift weight forward and get to upright.    Ambulation/Gait Ambulation/Gait assistance: Min guard Ambulation Distance (Feet): 30 Feet Assistive device: Rolling walker (2 wheeled)       General Gait Details: Pt did not have any LOBs and appeared relatively safe.  We noticed she had a BM in her diaper and deferred further ambulation at this time.  Pt  fatigued and frustrated with the bout of ambulation and needing a second clean up in 20 minutes.   Stairs            Wheelchair Mobility    Modified Rankin (Stroke Patients Only)       Balance Overall balance assessment: Needs assistance   Sitting balance-Leahy Scale: Good       Standing balance-Leahy Scale: Fair                      Cognition Arousal/Alertness: Awake/alert Behavior During Therapy: WFL for tasks assessed/performed Overall Cognitive Status: Within Functional Limits for tasks assessed                      Exercises      General Comments        Pertinent Vitals/Pain Pain Assessment:  (chronic LBP, "nothing I'm not used to")    Home Living                      Prior Function            PT Goals (current goals can now be found in the care plan section) Progress towards PT goals: Progressing toward goals    Frequency    Min 2X/week      PT Plan Current plan remains appropriate    Co-evaluation  End of Session Equipment Utilized During Treatment: Gait belt Activity Tolerance: Patient limited by fatigue Patient left: in bed;with call bell/phone within reach;with bed alarm set;with family/visitor present     Time: [id number] PT Time Calculation (min) (ACUTE ONLY): 13 min  Charges:  $Therapeutic Activity: 8-22 mins                    G Codes:      Natalie ProGalen R Lamija Rogers, DPT 08/23/2016, 9:35 AM

## 2016-08-23 NOTE — Progress Notes (Signed)
Inpatient Diabetes Program Recommendations  AACE/ADA: New Consensus Statement on Inpatient Glycemic Control (2015)  Target Ranges:  Prepandial:   less than 140 mg/dL      Peak postprandial:   less than 180 mg/dL (1-2 hours)      Critically ill patients:  140 - 180 mg/dL   Results for Natalie Rogers, Natalie Rogers (MRN [id number]) as of 08/23/2016 08:44  Ref. Range 08/23/2016 07:19 08/23/2016 07:35 08/23/2016 07:51  Glucose-Capillary Latest Ref Range: 65 - 99 mg/dL 42 (LL) 58 (L) 89    Admit with: R LE Cellulitis  History: DM, Dementia  Home DM Meds: Metformin 500 mg BID       Glipizide 10 mg BID  Current Insulin Orders: Metformin 500 mg BID                 Glipizide 5 mg BID      Novolog Sensitive Correction Scale/ SSI (0-9 units) TID AC + HS     MD- Patient with Hypoglycemia this AM (CBG down to 42 mg/dl).  PO intake only documented as being 25-50% of meals the last 2 days.  Please consider discontinuing Glipizide for now.  Can resume at time of discharge.     --Will follow patient during hospitalization--  Ambrose FinlandJeannine Johnston Lacreasha Hinds RN, MSN, CDE Diabetes Coordinator Inpatient Glycemic Control Team Team Pager: [phone] (8a-5p)

## 2016-08-24 DIAGNOSIS — Z7901 Long term (current) use of anticoagulants: Secondary | ICD-10-CM | POA: Diagnosis not present

## 2016-08-24 DIAGNOSIS — L03115 Cellulitis of right lower limb: Secondary | ICD-10-CM | POA: Diagnosis not present

## 2016-08-24 DIAGNOSIS — Z9181 History of falling: Secondary | ICD-10-CM | POA: Diagnosis not present

## 2016-08-24 DIAGNOSIS — I10 Essential (primary) hypertension: Secondary | ICD-10-CM | POA: Diagnosis not present

## 2016-08-24 DIAGNOSIS — E875 Hyperkalemia: Secondary | ICD-10-CM | POA: Diagnosis not present

## 2016-08-24 DIAGNOSIS — K219 Gastro-esophageal reflux disease without esophagitis: Secondary | ICD-10-CM | POA: Diagnosis not present

## 2016-08-24 DIAGNOSIS — L0390 Cellulitis, unspecified: Secondary | ICD-10-CM | POA: Diagnosis not present

## 2016-08-24 DIAGNOSIS — E118 Type 2 diabetes mellitus with unspecified complications: Secondary | ICD-10-CM | POA: Diagnosis not present

## 2016-08-24 DIAGNOSIS — F0390 Unspecified dementia without behavioral disturbance: Secondary | ICD-10-CM | POA: Diagnosis not present

## 2016-08-24 DIAGNOSIS — G9340 Encephalopathy, unspecified: Secondary | ICD-10-CM | POA: Diagnosis not present

## 2016-08-24 DIAGNOSIS — Z86718 Personal history of other venous thrombosis and embolism: Secondary | ICD-10-CM | POA: Diagnosis not present

## 2016-08-24 DIAGNOSIS — J441 Chronic obstructive pulmonary disease with (acute) exacerbation: Secondary | ICD-10-CM | POA: Diagnosis not present

## 2016-08-24 DIAGNOSIS — E119 Type 2 diabetes mellitus without complications: Secondary | ICD-10-CM | POA: Diagnosis not present

## 2016-08-24 DIAGNOSIS — R2681 Unsteadiness on feet: Secondary | ICD-10-CM | POA: Diagnosis not present

## 2016-08-24 LAB — BASIC METABOLIC PANEL
ANION GAP: 7 (ref 5–15)
BUN: 17 mg/dL (ref 6–20)
CHLORIDE: 97 mmol/L — AB (ref 101–111)
CO2: 29 mmol/L (ref 22–32)
CREATININE: 0.68 mg/dL (ref 0.44–1.00)
Calcium: 8.8 mg/dL — ABNORMAL LOW (ref 8.9–10.3)
GFR calc non Af Amer: >60 mL/min (ref >60)
Glucose, Bld: 84 mg/dL (ref 65–99)
Potassium: 4.7 mmol/L (ref 3.5–5.1)
Sodium: 133 mmol/L — ABNORMAL LOW (ref 135–145)

## 2016-08-24 LAB — CBC
HEMATOCRIT: 34.9 % — AB (ref 35.0–47.0)
HEMOGLOBIN: 12.1 g/dL (ref 12.0–16.0)
MCH: 28.9 pg (ref 26.0–34.0)
MCHC: 34.5 g/dL (ref 32.0–36.0)
MCV: 83.7 fL (ref 80.0–100.0)
Platelets: 262 10*3/uL (ref 150–440)
RBC: 4.17 MIL/uL (ref 3.80–5.20)
RDW: 14.9 % — AB (ref 11.5–14.5)
WBC: 7.3 10*3/uL (ref 3.6–11.0)

## 2016-08-24 LAB — GLUCOSE, CAPILLARY
Glucose-Capillary: 95 mg/dL (ref 65–99)
Glucose-Capillary: 151 mg/dL — ABNORMAL HIGH (ref 65–99)

## 2016-08-24 LAB — PROTIME-INR
INR: 2.24
Prothrombin Time: 25.2 seconds — ABNORMAL HIGH (ref 11.4–15.2)

## 2016-08-24 MED ORDER — AMLODIPINE BESYLATE 5 MG PO TABS: 5.0000 mg | ORAL_TABLET | Freq: Every day | ORAL | 2 refills | Status: DC

## 2016-08-24 MED ORDER — CEPHALEXIN 250 MG PO CAPS: 250.0000 mg | ORAL_CAPSULE | Freq: Four times a day (QID) | ORAL | 0 refills | Status: DC

## 2016-08-24 MED ORDER — LISINOPRIL 10 MG PO TABS: 10.0000 mg | ORAL_TABLET | Freq: Every day | ORAL | 2 refills | Status: DC

## 2016-08-24 MED ORDER — AMLODIPINE BESYLATE 5 MG PO TABS
5.0000 mg | ORAL_TABLET | Freq: Every day | ORAL | Status: DC
  Administered 2016-08-24: 5 mg via ORAL
  Filled 2016-08-24: qty 1

## 2016-08-24 NOTE — Discharge Summary (Addendum)
[facility] [place] at [facility]   PATIENT NAME: Natalie Rogers    MR#:  [id number]  DATE OF BIRTH:  April 02, 1924  DATE OF ADMISSION:  08/20/2016   ADMITTING PHYSICIAN: Milagros Loll, MD  DATE OF DISCHARGE: 08/24/2016  PRIMARY CARE PHYSICIAN: Marina Goodell, MD   ADMISSION DIAGNOSIS:   Fall [W19.XXXA] Cellulitis of right lower extremity [L03.115] Fall from standing, initial encounter [W19.XXXA]  DISCHARGE DIAGNOSIS:   Active Problems:   Cellulitis   SECONDARY DIAGNOSIS:   Past Medical History:  Diagnosis Date  . COPD (chronic obstructive pulmonary disease) (HCC)   . DVT (deep venous thrombosis) (HCC)   . GERD (gastroesophageal reflux disease)   . HLD (hyperlipidemia)   . HTN (hypertension)   . PE (pulmonary embolism)    on chronic anticoagulation  . Type 2 diabetes mellitus [facility])     HOSPITAL COURSE:   83 year old female with past medical history significant for mild cognitive impairment, hypertension and diabetes, COPD, history of DVT and PE on Coumadin chronically presents to the hospital secondary to worsening confusion and right leg cellulitis.  #1 right lower extremity cellulitis-failed outpatient Bactrim. Started on IV antibiotics in the hospital. Much improvement noted. Keep the right leg elevated. Currently dressing for wound care recommendations for a small ulcer on the medial side of the leg. -Changed to Keflex and will be discharged on the same. -No other source of infection identified. Blood cultures and urine cultures remain negative.  #2 altered mental status-metabolic encephalopathy from underlying infection. -Much improved now. Back to baseline.  #3 history of DVT and PE-on chronic Coumadin. INR is therapeutic. -Being discharged on home dose of Coumadin. Check INR in 3 days  #4 hyperkalemia on admission-secondary to Bactrim. Recheck labs with potassium at 4.7. Decreased the lisinopril dosage.  #5 diabetes mellitus-her  sugars have been on the lower side in the hospital. Glipizide has been discontinued. A1c is 6.4. -Being discharged only on metformin at this time.  #6 Hypertension-on metoprolol, Norvasc and lisinopril.    Worked with physical therapy and they have recommended rehabilitation. Patient will be discharged to [facility] in [place].   DISCHARGE CONDITIONS:   Guarded  CONSULTS OBTAINED:   None  DRUG ALLERGIES:   Allergies  Allergen Reactions  . Penicillins Anaphylaxis, Rash and Other (See Comments)    Has patient had a PCN reaction causing immediate rash, facial/tongue/throat swelling, SOB or lightheadedness with hypotension: Yes Has patient had a PCN reaction causing severe rash involving mucus membranes or skin necrosis: No Has patient had a PCN reaction that required hospitalization No Has patient had a PCN reaction occurring within the last 10 years: No If all of the above answers are "NO", then may proceed with Cephalosporin use.  . Codeine   . Demerol [Meperidine] Rash   DISCHARGE MEDICATIONS:   Allergies as of 08/24/2016      Reactions   Penicillins Anaphylaxis, Rash, Other (See Comments)   Has patient had a PCN reaction causing immediate rash, facial/tongue/throat swelling, SOB or lightheadedness with hypotension: Yes Has patient had a PCN reaction causing severe rash involving mucus membranes or skin necrosis: No Has patient had a PCN reaction that required hospitalization No Has patient had a PCN reaction occurring within the last 10 years: No If all of the above answers are "NO", then may proceed with Cephalosporin use.   Codeine    Demerol [meperidine] Rash      Medication List    STOP taking these medications  BACTRIM PO   glipiZIDE 10 MG 24 hr tablet Commonly known as:  GLUCOTROL XL   ondansetron 4 MG tablet Commonly known as:  ZOFRAN     TAKE these medications   acetaminophen 500 MG tablet Commonly known as:  TYLENOL Take 500 mg by mouth every 6 (six)  hours as needed for mild pain or headache.   cephALEXin 250 MG capsule Commonly known as:  KEFLEX Take 1 capsule (250 mg total) by mouth 4 (four) times daily. X 2 more days What changed:  when to take this  additional instructions   feeding supplement (GLUCERNA SHAKE) Liqd Take 237 mLs by mouth 3 (three) times daily between meals.   lidocaine 5 % Commonly known as:  LIDODERM Place 1 patch onto the skin daily. Remove & Discard patch within 12 hours or as directed by MD   lisinopril 40 MG tablet Commonly known as:  PRINIVIL,ZESTRIL Take 40 mg by mouth daily.   metFORMIN 500 MG 24 hr tablet Commonly known as:  GLUCOPHAGE-XR Take 500 mg by mouth 2 (two) times daily.   metoprolol succinate 25 MG 24 hr tablet Commonly known as:  TOPROL-XL Take 25 mg by mouth daily.   pantoprazole 40 MG tablet Commonly known as:  PROTONIX Take 40 mg by mouth daily.   pravastatin 40 MG tablet Commonly known as:  PRAVACHOL Take 40 mg by mouth at bedtime.   traZODone 50 MG tablet Commonly known as:  DESYREL Take 25 mg by mouth at bedtime.   vitamin B-12 1000 MCG tablet Commonly known as:  CYANOCOBALAMIN Take 1,000 mcg by mouth daily.   warfarin 2.5 MG tablet Commonly known as:  COUMADIN Take 2.5 mg by mouth at bedtime. Pt takes this dose on Sunday, Tuesday, Wednesday, Friday, and Saturday.   warfarin 5 MG tablet Commonly known as:  COUMADIN Take 5 mg by mouth at bedtime. Pt takes this dose on Monday and Thursday.        DISCHARGE INSTRUCTIONS:   1. PCP f/u in 1 week 2. INR check in 3 days  DIET:   Cardiac diet  ACTIVITY:   Activity as tolerated  OXYGEN:   Home Oxygen: No.  Oxygen Delivery: room air  DISCHARGE LOCATION:   nursing home   If you experience worsening of your admission symptoms, develop shortness of breath, life threatening emergency, suicidal or homicidal thoughts you must seek medical attention immediately by calling 911 or calling your MD  immediately  if symptoms less severe.  You Must read complete instructions/literature along with all the possible adverse reactions/side effects for all the Medicines you take and that have been prescribed to you. Take any new Medicines after you have completely understood and accpet all the possible adverse reactions/side effects.   Please note  You were cared for by a hospitalist during your hospital stay. If you have any questions about your discharge medications or the care you received while you were in the hospital after you are discharged, you can call the unit and asked to speak with the hospitalist on call if the hospitalist that took care of you is not available. Once you are discharged, your primary care physician will handle any further medical issues. Please note that NO REFILLS for any discharge medications will be authorized once you are discharged, as it is imperative that you return to your primary care physician (or establish a relationship with a primary care physician if you do not have one) for your aftercare needs so that they  can reassess your need for medications and monitor your lab values.    On the day of Discharge:  VITAL SIGNS:   Blood pressure (!) 146/48, pulse (!) 59, temperature 98 F (36.7 C), temperature source Oral, resp. rate 18, height 5\' 5"  (1.651 m), weight 71.4 kg (157 lb 8 oz), SpO2 93 %.  PHYSICAL EXAMINATION:    GENERAL:  83 y.o.-year-old patient lying in the bed with no acute distress.  EYES: Pupils equal, round, reactive to light and accommodation. No scleral icterus. Extraocular muscles intact.  HEENT: Head atraumatic, normocephalic. Oropharynx and nasopharynx clear.  NECK:  Supple, no jugular venous distention. No thyroid enlargement, no tenderness.  LUNGS: Normal breath sounds bilaterally, no wheezing, rales,rhonchi or crepitation. No use of accessory muscles of respiration. Decreased bibasilar breath sounds CARDIOVASCULAR: S1, S2 normal. No   rubs, or gallops. 3/6 systolic murmur present ABDOMEN: Soft, non-tender, non-distended. Bowel sounds present. No organomegaly or mass.  EXTREMITIES: No pedal edema, cyanosis, or clubbing. No erythema of the right leg noted. Leg is wrapped. NEUROLOGIC: Cranial nerves II through XII are intact. Muscle strength 5/5 in all extremities. Sensation intact. Gait not checked.  PSYCHIATRIC: The patient is alert and oriented x 2-3.  SKIN: No obvious rash, lesion, or ulcer.   DATA REVIEW:   CBC  Recent Labs Lab 08/24/16 0600  WBC 7.3  HGB 12.1  HCT 34.9*  PLT 262    Chemistries   Recent Labs Lab 08/20/16 1248 08/21/16 0434  NA 132* 136  K 4.6 4.1  CL 99* 102  CO2 25 27  GLUCOSE 233* 63*  BUN 13 14  CREATININE 0.92 0.84  CALCIUM 8.9 8.8*  AST 25  --   ALT 14  --   ALKPHOS 47  --   BILITOT 0.5  --      Microbiology Results  Results for orders placed or performed during the hospital encounter of 08/20/16  Urine culture     Status: None   Collection Time: 08/20/16  3:25 PM  Result Value Ref Range Status   Specimen Description URINE, RANDOM  Final   Special Requests NONE  Final   Culture NO GROWTH [facility]   Final   Report Status 08/22/2016 FINAL  Final  CULTURE, BLOOD (ROUTINE X 2) w Reflex to ID Panel     Status: None (Preliminary result)   Collection Time: 08/20/16  7:17 PM  Result Value Ref Range Status   Specimen Description BLOOD RIGHT ANTECUBITAL  Final   Special Requests   Final    BOTTLES DRAWN AEROBIC AND ANAEROBIC 10CCAERO,5CCANA   Culture NO GROWTH 3 DAYS  Final   Report Status PENDING  Incomplete  CULTURE, BLOOD (ROUTINE X 2) w Reflex to ID Panel     Status: None (Preliminary result)   Collection Time: 08/20/16  7:17 PM  Result Value Ref Range Status   Specimen Description BLOOD LEFT ANTECUBITAL  Final   Special Requests   Final    BOTTLES DRAWN AEROBIC AND ANAEROBIC 10CCAERO,11CCANA   Culture NO GROWTH 3 DAYS  Final   Report  Status PENDING  Incomplete  C difficile quick scan w PCR reflex     Status: None   Collection Time: 08/23/16  9:34 AM  Result Value Ref Range Status   C Diff antigen NEGATIVE NEGATIVE Final   C Diff toxin NEGATIVE NEGATIVE Final   C Diff interpretation No C. difficile detected.  Final    RADIOLOGY:  No results found.  Management plans discussed with the patient, family and they are in agreement.  CODE STATUS:     Code Status Orders        Start     Ordered   08/20/16 1545  Limited resuscitation (code)  Continuous    Question Answer Comment  In the event of cardiac or respiratory ARREST: Initiate Code Blue, Call Rapid Response Yes   In the event of cardiac or respiratory ARREST: Perform CPR Yes   In the event of cardiac or respiratory ARREST: Perform Intubation/Mechanical Ventilation No   In the event of cardiac or respiratory ARREST: Use NIPPV/BiPAp only if indicated Yes   In the event of cardiac or respiratory ARREST: Administer ACLS medications if indicated Yes   In the event of cardiac or respiratory ARREST: Perform Defibrillation or Cardioversion if indicated Yes      08/20/16 1546    Code Status History    Date Active Date Inactive Code Status Order ID Comments User Context   01/05/2016 12:36 AM 01/06/2016  5:53 PM Full Code [id number]  Oralia Manis, MD Inpatient      TOTAL TIME TAKING CARE OF THIS PATIENT: 38 minutes.    Enid Baas M.D on 08/24/2016 at 7:51 AM  Between 7am to 6pm - Pager - [phone]  After 6pm go to [email or web address] - Social research officer, government  [facility] [place] [facility]  Office  [phone]  CC: Primary care physician; [facility], Madaline Guthrie, MD   Note: This dictation was prepared with Dragon dictation along with smaller phrase technology. Any transcriptional errors that result from this process are unintentional.

## 2016-08-24 NOTE — Clinical Social Work Note (Signed)
Patient to be d/c'[facility] today [facility] [place].  Patient and family agreeable to plans will transport via daughter's car RN to call report to [phone].  Windell MouldingEric Krayton Wortley, MSW, LCSWA Mon-Fri 8a-4:30p [phone]

## 2016-08-25 DIAGNOSIS — L03115 Cellulitis of right lower limb: Secondary | ICD-10-CM | POA: Diagnosis not present

## 2016-08-25 DIAGNOSIS — J441 Chronic obstructive pulmonary disease with (acute) exacerbation: Secondary | ICD-10-CM | POA: Diagnosis not present

## 2016-08-25 DIAGNOSIS — E119 Type 2 diabetes mellitus without complications: Secondary | ICD-10-CM | POA: Diagnosis not present

## 2016-08-25 DIAGNOSIS — I10 Essential (primary) hypertension: Secondary | ICD-10-CM | POA: Diagnosis not present

## 2016-08-25 DIAGNOSIS — K219 Gastro-esophageal reflux disease without esophagitis: Secondary | ICD-10-CM | POA: Diagnosis not present

## 2016-08-25 LAB — CULTURE, BLOOD (ROUTINE X 2)
Culture: NO GROWTH
Culture: NO GROWTH

## 2016-09-21 DIAGNOSIS — M6281 Muscle weakness (generalized): Secondary | ICD-10-CM | POA: Diagnosis not present

## 2016-09-25 DIAGNOSIS — J449 Chronic obstructive pulmonary disease, unspecified: Secondary | ICD-10-CM | POA: Diagnosis not present

## 2016-09-25 DIAGNOSIS — R531 Weakness: Secondary | ICD-10-CM | POA: Diagnosis not present

## 2016-09-25 DIAGNOSIS — L03115 Cellulitis of right lower limb: Secondary | ICD-10-CM | POA: Diagnosis not present

## 2016-09-25 DIAGNOSIS — K449 Diaphragmatic hernia without obstruction or gangrene: Secondary | ICD-10-CM | POA: Diagnosis not present

## 2016-09-25 DIAGNOSIS — J441 Chronic obstructive pulmonary disease with (acute) exacerbation: Secondary | ICD-10-CM | POA: Diagnosis not present

## 2016-09-25 DIAGNOSIS — L89152 Pressure ulcer of sacral region, stage 2: Secondary | ICD-10-CM | POA: Diagnosis not present

## 2016-09-25 DIAGNOSIS — N390 Urinary tract infection, site not specified: Secondary | ICD-10-CM | POA: Diagnosis not present

## 2016-09-25 DIAGNOSIS — R9431 Abnormal electrocardiogram [ECG] [EKG]: Secondary | ICD-10-CM | POA: Diagnosis not present

## 2016-09-25 DIAGNOSIS — E785 Hyperlipidemia, unspecified: Secondary | ICD-10-CM | POA: Diagnosis not present

## 2016-09-25 DIAGNOSIS — R791 Abnormal coagulation profile: Secondary | ICD-10-CM | POA: Diagnosis not present

## 2016-09-25 DIAGNOSIS — S61511A Laceration without foreign body of right wrist, initial encounter: Secondary | ICD-10-CM | POA: Diagnosis not present

## 2016-09-25 DIAGNOSIS — I10 Essential (primary) hypertension: Secondary | ICD-10-CM | POA: Diagnosis not present

## 2016-09-25 DIAGNOSIS — E119 Type 2 diabetes mellitus without complications: Secondary | ICD-10-CM | POA: Diagnosis not present

## 2016-09-25 DIAGNOSIS — R404 Transient alteration of awareness: Secondary | ICD-10-CM | POA: Diagnosis not present

## 2016-09-25 DIAGNOSIS — R296 Repeated falls: Secondary | ICD-10-CM | POA: Diagnosis not present

## 2016-09-25 DIAGNOSIS — Z86718 Personal history of other venous thrombosis and embolism: Secondary | ICD-10-CM | POA: Diagnosis not present

## 2016-09-25 DIAGNOSIS — Z86711 Personal history of pulmonary embolism: Secondary | ICD-10-CM | POA: Diagnosis not present

## 2016-09-26 DIAGNOSIS — Z86711 Personal history of pulmonary embolism: Secondary | ICD-10-CM | POA: Diagnosis not present

## 2016-09-26 DIAGNOSIS — N390 Urinary tract infection, site not specified: Secondary | ICD-10-CM | POA: Diagnosis not present

## 2016-09-26 DIAGNOSIS — Z86718 Personal history of other venous thrombosis and embolism: Secondary | ICD-10-CM | POA: Diagnosis not present

## 2016-09-26 DIAGNOSIS — J441 Chronic obstructive pulmonary disease with (acute) exacerbation: Secondary | ICD-10-CM | POA: Diagnosis not present

## 2016-09-26 DIAGNOSIS — L03115 Cellulitis of right lower limb: Secondary | ICD-10-CM | POA: Diagnosis not present

## 2016-09-27 DIAGNOSIS — Z86711 Personal history of pulmonary embolism: Secondary | ICD-10-CM | POA: Diagnosis not present

## 2016-09-27 DIAGNOSIS — J441 Chronic obstructive pulmonary disease with (acute) exacerbation: Secondary | ICD-10-CM | POA: Diagnosis not present

## 2016-09-27 DIAGNOSIS — N390 Urinary tract infection, site not specified: Secondary | ICD-10-CM | POA: Diagnosis not present

## 2016-09-27 DIAGNOSIS — L03115 Cellulitis of right lower limb: Secondary | ICD-10-CM | POA: Diagnosis not present

## 2016-09-27 DIAGNOSIS — Z86718 Personal history of other venous thrombosis and embolism: Secondary | ICD-10-CM | POA: Diagnosis not present

## 2016-09-28 DIAGNOSIS — N390 Urinary tract infection, site not specified: Secondary | ICD-10-CM | POA: Diagnosis not present

## 2016-09-28 DIAGNOSIS — K449 Diaphragmatic hernia without obstruction or gangrene: Secondary | ICD-10-CM | POA: Diagnosis not present

## 2016-09-28 DIAGNOSIS — Z86711 Personal history of pulmonary embolism: Secondary | ICD-10-CM | POA: Diagnosis not present

## 2016-09-28 DIAGNOSIS — J441 Chronic obstructive pulmonary disease with (acute) exacerbation: Secondary | ICD-10-CM | POA: Diagnosis not present

## 2016-09-28 DIAGNOSIS — L03115 Cellulitis of right lower limb: Secondary | ICD-10-CM | POA: Diagnosis not present

## 2016-09-28 DIAGNOSIS — L89152 Pressure ulcer of sacral region, stage 2: Secondary | ICD-10-CM | POA: Diagnosis not present

## 2016-09-28 DIAGNOSIS — E119 Type 2 diabetes mellitus without complications: Secondary | ICD-10-CM | POA: Diagnosis not present

## 2016-09-28 DIAGNOSIS — J449 Chronic obstructive pulmonary disease, unspecified: Secondary | ICD-10-CM | POA: Diagnosis not present

## 2016-09-28 DIAGNOSIS — I10 Essential (primary) hypertension: Secondary | ICD-10-CM | POA: Diagnosis not present

## 2016-09-28 DIAGNOSIS — E785 Hyperlipidemia, unspecified: Secondary | ICD-10-CM | POA: Diagnosis not present

## 2016-09-30 DIAGNOSIS — N390 Urinary tract infection, site not specified: Secondary | ICD-10-CM | POA: Diagnosis not present

## 2016-09-30 DIAGNOSIS — E785 Hyperlipidemia, unspecified: Secondary | ICD-10-CM | POA: Diagnosis not present

## 2016-09-30 DIAGNOSIS — E119 Type 2 diabetes mellitus without complications: Secondary | ICD-10-CM | POA: Diagnosis not present

## 2016-09-30 DIAGNOSIS — I10 Essential (primary) hypertension: Secondary | ICD-10-CM | POA: Diagnosis not present

## 2016-09-30 DIAGNOSIS — K449 Diaphragmatic hernia without obstruction or gangrene: Secondary | ICD-10-CM | POA: Diagnosis not present

## 2016-09-30 DIAGNOSIS — L89152 Pressure ulcer of sacral region, stage 2: Secondary | ICD-10-CM | POA: Diagnosis not present

## 2016-09-30 DIAGNOSIS — Z86711 Personal history of pulmonary embolism: Secondary | ICD-10-CM | POA: Diagnosis not present

## 2016-09-30 DIAGNOSIS — J449 Chronic obstructive pulmonary disease, unspecified: Secondary | ICD-10-CM | POA: Diagnosis not present

## 2016-09-30 DIAGNOSIS — L03115 Cellulitis of right lower limb: Secondary | ICD-10-CM | POA: Diagnosis not present

## 2016-10-03 DIAGNOSIS — E1165 Type 2 diabetes mellitus with hyperglycemia: Secondary | ICD-10-CM | POA: Diagnosis not present

## 2016-10-03 DIAGNOSIS — E784 Other hyperlipidemia: Secondary | ICD-10-CM | POA: Diagnosis not present

## 2016-10-03 DIAGNOSIS — K210 Gastro-esophageal reflux disease with esophagitis: Secondary | ICD-10-CM | POA: Diagnosis not present

## 2016-10-03 DIAGNOSIS — F0280 Dementia in other diseases classified elsewhere without behavioral disturbance: Secondary | ICD-10-CM | POA: Diagnosis not present

## 2016-10-03 DIAGNOSIS — I10 Essential (primary) hypertension: Secondary | ICD-10-CM | POA: Diagnosis not present

## 2016-10-03 DIAGNOSIS — J440 Chronic obstructive pulmonary disease with acute lower respiratory infection: Secondary | ICD-10-CM | POA: Diagnosis not present

## 2016-10-03 DIAGNOSIS — Z7901 Long term (current) use of anticoagulants: Secondary | ICD-10-CM | POA: Diagnosis not present

## 2016-10-04 DIAGNOSIS — K449 Diaphragmatic hernia without obstruction or gangrene: Secondary | ICD-10-CM | POA: Diagnosis not present

## 2016-10-04 DIAGNOSIS — N390 Urinary tract infection, site not specified: Secondary | ICD-10-CM | POA: Diagnosis not present

## 2016-10-04 DIAGNOSIS — E119 Type 2 diabetes mellitus without complications: Secondary | ICD-10-CM | POA: Diagnosis not present

## 2016-10-04 DIAGNOSIS — I10 Essential (primary) hypertension: Secondary | ICD-10-CM | POA: Diagnosis not present

## 2016-10-04 DIAGNOSIS — L89152 Pressure ulcer of sacral region, stage 2: Secondary | ICD-10-CM | POA: Diagnosis not present

## 2016-10-04 DIAGNOSIS — Z86711 Personal history of pulmonary embolism: Secondary | ICD-10-CM | POA: Diagnosis not present

## 2016-10-04 DIAGNOSIS — L03115 Cellulitis of right lower limb: Secondary | ICD-10-CM | POA: Diagnosis not present

## 2016-10-04 DIAGNOSIS — J449 Chronic obstructive pulmonary disease, unspecified: Secondary | ICD-10-CM | POA: Diagnosis not present

## 2016-10-04 DIAGNOSIS — E785 Hyperlipidemia, unspecified: Secondary | ICD-10-CM | POA: Diagnosis not present

## 2016-10-05 DIAGNOSIS — Z86711 Personal history of pulmonary embolism: Secondary | ICD-10-CM | POA: Diagnosis not present

## 2016-10-05 DIAGNOSIS — K449 Diaphragmatic hernia without obstruction or gangrene: Secondary | ICD-10-CM | POA: Diagnosis not present

## 2016-10-05 DIAGNOSIS — N390 Urinary tract infection, site not specified: Secondary | ICD-10-CM | POA: Diagnosis not present

## 2016-10-05 DIAGNOSIS — J449 Chronic obstructive pulmonary disease, unspecified: Secondary | ICD-10-CM | POA: Diagnosis not present

## 2016-10-05 DIAGNOSIS — E785 Hyperlipidemia, unspecified: Secondary | ICD-10-CM | POA: Diagnosis not present

## 2016-10-05 DIAGNOSIS — L89152 Pressure ulcer of sacral region, stage 2: Secondary | ICD-10-CM | POA: Diagnosis not present

## 2016-10-05 DIAGNOSIS — L03115 Cellulitis of right lower limb: Secondary | ICD-10-CM | POA: Diagnosis not present

## 2016-10-05 DIAGNOSIS — E119 Type 2 diabetes mellitus without complications: Secondary | ICD-10-CM | POA: Diagnosis not present

## 2016-10-05 DIAGNOSIS — I10 Essential (primary) hypertension: Secondary | ICD-10-CM | POA: Diagnosis not present

## 2016-10-06 DIAGNOSIS — E119 Type 2 diabetes mellitus without complications: Secondary | ICD-10-CM | POA: Diagnosis not present

## 2016-10-06 DIAGNOSIS — N390 Urinary tract infection, site not specified: Secondary | ICD-10-CM | POA: Diagnosis not present

## 2016-10-06 DIAGNOSIS — I10 Essential (primary) hypertension: Secondary | ICD-10-CM | POA: Diagnosis not present

## 2016-10-06 DIAGNOSIS — L89152 Pressure ulcer of sacral region, stage 2: Secondary | ICD-10-CM | POA: Diagnosis not present

## 2016-10-06 DIAGNOSIS — K449 Diaphragmatic hernia without obstruction or gangrene: Secondary | ICD-10-CM | POA: Diagnosis not present

## 2016-10-06 DIAGNOSIS — J449 Chronic obstructive pulmonary disease, unspecified: Secondary | ICD-10-CM | POA: Diagnosis not present

## 2016-10-06 DIAGNOSIS — L03115 Cellulitis of right lower limb: Secondary | ICD-10-CM | POA: Diagnosis not present

## 2016-10-06 DIAGNOSIS — E785 Hyperlipidemia, unspecified: Secondary | ICD-10-CM | POA: Diagnosis not present

## 2016-10-06 DIAGNOSIS — Z86711 Personal history of pulmonary embolism: Secondary | ICD-10-CM | POA: Diagnosis not present

## 2016-10-10 DIAGNOSIS — N390 Urinary tract infection, site not specified: Secondary | ICD-10-CM | POA: Diagnosis not present

## 2016-10-10 DIAGNOSIS — I10 Essential (primary) hypertension: Secondary | ICD-10-CM | POA: Diagnosis not present

## 2016-10-10 DIAGNOSIS — J449 Chronic obstructive pulmonary disease, unspecified: Secondary | ICD-10-CM | POA: Diagnosis not present

## 2016-10-10 DIAGNOSIS — K449 Diaphragmatic hernia without obstruction or gangrene: Secondary | ICD-10-CM | POA: Diagnosis not present

## 2016-10-10 DIAGNOSIS — E785 Hyperlipidemia, unspecified: Secondary | ICD-10-CM | POA: Diagnosis not present

## 2016-10-10 DIAGNOSIS — L03115 Cellulitis of right lower limb: Secondary | ICD-10-CM | POA: Diagnosis not present

## 2016-10-10 DIAGNOSIS — L89152 Pressure ulcer of sacral region, stage 2: Secondary | ICD-10-CM | POA: Diagnosis not present

## 2016-10-10 DIAGNOSIS — Z86711 Personal history of pulmonary embolism: Secondary | ICD-10-CM | POA: Diagnosis not present

## 2016-10-10 DIAGNOSIS — E119 Type 2 diabetes mellitus without complications: Secondary | ICD-10-CM | POA: Diagnosis not present

## 2016-10-11 DIAGNOSIS — I10 Essential (primary) hypertension: Secondary | ICD-10-CM | POA: Diagnosis not present

## 2016-10-11 DIAGNOSIS — K449 Diaphragmatic hernia without obstruction or gangrene: Secondary | ICD-10-CM | POA: Diagnosis not present

## 2016-10-11 DIAGNOSIS — L89152 Pressure ulcer of sacral region, stage 2: Secondary | ICD-10-CM | POA: Diagnosis not present

## 2016-10-11 DIAGNOSIS — E785 Hyperlipidemia, unspecified: Secondary | ICD-10-CM | POA: Diagnosis not present

## 2016-10-11 DIAGNOSIS — N390 Urinary tract infection, site not specified: Secondary | ICD-10-CM | POA: Diagnosis not present

## 2016-10-11 DIAGNOSIS — Z86711 Personal history of pulmonary embolism: Secondary | ICD-10-CM | POA: Diagnosis not present

## 2016-10-11 DIAGNOSIS — J449 Chronic obstructive pulmonary disease, unspecified: Secondary | ICD-10-CM | POA: Diagnosis not present

## 2016-10-11 DIAGNOSIS — E119 Type 2 diabetes mellitus without complications: Secondary | ICD-10-CM | POA: Diagnosis not present

## 2016-10-11 DIAGNOSIS — L03115 Cellulitis of right lower limb: Secondary | ICD-10-CM | POA: Diagnosis not present

## 2016-10-12 DIAGNOSIS — L89152 Pressure ulcer of sacral region, stage 2: Secondary | ICD-10-CM | POA: Diagnosis not present

## 2016-10-12 DIAGNOSIS — E785 Hyperlipidemia, unspecified: Secondary | ICD-10-CM | POA: Diagnosis not present

## 2016-10-12 DIAGNOSIS — L03115 Cellulitis of right lower limb: Secondary | ICD-10-CM | POA: Diagnosis not present

## 2016-10-12 DIAGNOSIS — N390 Urinary tract infection, site not specified: Secondary | ICD-10-CM | POA: Diagnosis not present

## 2016-10-12 DIAGNOSIS — Z86711 Personal history of pulmonary embolism: Secondary | ICD-10-CM | POA: Diagnosis not present

## 2016-10-12 DIAGNOSIS — I10 Essential (primary) hypertension: Secondary | ICD-10-CM | POA: Diagnosis not present

## 2016-10-12 DIAGNOSIS — E119 Type 2 diabetes mellitus without complications: Secondary | ICD-10-CM | POA: Diagnosis not present

## 2016-10-12 DIAGNOSIS — K449 Diaphragmatic hernia without obstruction or gangrene: Secondary | ICD-10-CM | POA: Diagnosis not present

## 2016-10-12 DIAGNOSIS — J449 Chronic obstructive pulmonary disease, unspecified: Secondary | ICD-10-CM | POA: Diagnosis not present

## 2016-10-13 DIAGNOSIS — Z86711 Personal history of pulmonary embolism: Secondary | ICD-10-CM | POA: Diagnosis not present

## 2016-10-13 DIAGNOSIS — I10 Essential (primary) hypertension: Secondary | ICD-10-CM | POA: Diagnosis not present

## 2016-10-13 DIAGNOSIS — K449 Diaphragmatic hernia without obstruction or gangrene: Secondary | ICD-10-CM | POA: Diagnosis not present

## 2016-10-13 DIAGNOSIS — L89152 Pressure ulcer of sacral region, stage 2: Secondary | ICD-10-CM | POA: Diagnosis not present

## 2016-10-13 DIAGNOSIS — J449 Chronic obstructive pulmonary disease, unspecified: Secondary | ICD-10-CM | POA: Diagnosis not present

## 2016-10-13 DIAGNOSIS — E785 Hyperlipidemia, unspecified: Secondary | ICD-10-CM | POA: Diagnosis not present

## 2016-10-13 DIAGNOSIS — N390 Urinary tract infection, site not specified: Secondary | ICD-10-CM | POA: Diagnosis not present

## 2016-10-13 DIAGNOSIS — E119 Type 2 diabetes mellitus without complications: Secondary | ICD-10-CM | POA: Diagnosis not present

## 2016-10-13 DIAGNOSIS — L03115 Cellulitis of right lower limb: Secondary | ICD-10-CM | POA: Diagnosis not present

## 2016-10-17 DIAGNOSIS — E785 Hyperlipidemia, unspecified: Secondary | ICD-10-CM | POA: Diagnosis not present

## 2016-10-17 DIAGNOSIS — E119 Type 2 diabetes mellitus without complications: Secondary | ICD-10-CM | POA: Diagnosis not present

## 2016-10-17 DIAGNOSIS — K449 Diaphragmatic hernia without obstruction or gangrene: Secondary | ICD-10-CM | POA: Diagnosis not present

## 2016-10-17 DIAGNOSIS — L03115 Cellulitis of right lower limb: Secondary | ICD-10-CM | POA: Diagnosis not present

## 2016-10-17 DIAGNOSIS — I10 Essential (primary) hypertension: Secondary | ICD-10-CM | POA: Diagnosis not present

## 2016-10-17 DIAGNOSIS — L89152 Pressure ulcer of sacral region, stage 2: Secondary | ICD-10-CM | POA: Diagnosis not present

## 2016-10-17 DIAGNOSIS — Z86711 Personal history of pulmonary embolism: Secondary | ICD-10-CM | POA: Diagnosis not present

## 2016-10-17 DIAGNOSIS — J449 Chronic obstructive pulmonary disease, unspecified: Secondary | ICD-10-CM | POA: Diagnosis not present

## 2016-10-17 DIAGNOSIS — N390 Urinary tract infection, site not specified: Secondary | ICD-10-CM | POA: Diagnosis not present

## 2016-10-18 DIAGNOSIS — K210 Gastro-esophageal reflux disease with esophagitis: Secondary | ICD-10-CM | POA: Diagnosis not present

## 2016-10-18 DIAGNOSIS — E1165 Type 2 diabetes mellitus with hyperglycemia: Secondary | ICD-10-CM | POA: Diagnosis not present

## 2016-10-18 DIAGNOSIS — F0280 Dementia in other diseases classified elsewhere without behavioral disturbance: Secondary | ICD-10-CM | POA: Diagnosis not present

## 2016-10-18 DIAGNOSIS — J440 Chronic obstructive pulmonary disease with acute lower respiratory infection: Secondary | ICD-10-CM | POA: Diagnosis not present

## 2016-10-18 DIAGNOSIS — I10 Essential (primary) hypertension: Secondary | ICD-10-CM | POA: Diagnosis not present

## 2016-10-18 DIAGNOSIS — E784 Other hyperlipidemia: Secondary | ICD-10-CM | POA: Diagnosis not present

## 2016-10-20 DIAGNOSIS — E785 Hyperlipidemia, unspecified: Secondary | ICD-10-CM | POA: Diagnosis not present

## 2016-10-20 DIAGNOSIS — L03115 Cellulitis of right lower limb: Secondary | ICD-10-CM | POA: Diagnosis not present

## 2016-10-20 DIAGNOSIS — E119 Type 2 diabetes mellitus without complications: Secondary | ICD-10-CM | POA: Diagnosis not present

## 2016-10-20 DIAGNOSIS — I10 Essential (primary) hypertension: Secondary | ICD-10-CM | POA: Diagnosis not present

## 2016-10-20 DIAGNOSIS — Z86711 Personal history of pulmonary embolism: Secondary | ICD-10-CM | POA: Diagnosis not present

## 2016-10-20 DIAGNOSIS — N390 Urinary tract infection, site not specified: Secondary | ICD-10-CM | POA: Diagnosis not present

## 2016-10-20 DIAGNOSIS — K449 Diaphragmatic hernia without obstruction or gangrene: Secondary | ICD-10-CM | POA: Diagnosis not present

## 2016-10-20 DIAGNOSIS — J449 Chronic obstructive pulmonary disease, unspecified: Secondary | ICD-10-CM | POA: Diagnosis not present

## 2016-10-20 DIAGNOSIS — L89152 Pressure ulcer of sacral region, stage 2: Secondary | ICD-10-CM | POA: Diagnosis not present

## 2016-10-21 DIAGNOSIS — K449 Diaphragmatic hernia without obstruction or gangrene: Secondary | ICD-10-CM | POA: Diagnosis not present

## 2016-10-21 DIAGNOSIS — Z86711 Personal history of pulmonary embolism: Secondary | ICD-10-CM | POA: Diagnosis not present

## 2016-10-21 DIAGNOSIS — E785 Hyperlipidemia, unspecified: Secondary | ICD-10-CM | POA: Diagnosis not present

## 2016-10-21 DIAGNOSIS — L03115 Cellulitis of right lower limb: Secondary | ICD-10-CM | POA: Diagnosis not present

## 2016-10-21 DIAGNOSIS — J449 Chronic obstructive pulmonary disease, unspecified: Secondary | ICD-10-CM | POA: Diagnosis not present

## 2016-10-21 DIAGNOSIS — L89152 Pressure ulcer of sacral region, stage 2: Secondary | ICD-10-CM | POA: Diagnosis not present

## 2016-10-21 DIAGNOSIS — E119 Type 2 diabetes mellitus without complications: Secondary | ICD-10-CM | POA: Diagnosis not present

## 2016-10-21 DIAGNOSIS — N390 Urinary tract infection, site not specified: Secondary | ICD-10-CM | POA: Diagnosis not present

## 2016-10-21 DIAGNOSIS — I10 Essential (primary) hypertension: Secondary | ICD-10-CM | POA: Diagnosis not present

## 2016-10-24 DIAGNOSIS — I10 Essential (primary) hypertension: Secondary | ICD-10-CM | POA: Diagnosis not present

## 2016-10-24 DIAGNOSIS — J449 Chronic obstructive pulmonary disease, unspecified: Secondary | ICD-10-CM | POA: Diagnosis not present

## 2016-10-24 DIAGNOSIS — L89152 Pressure ulcer of sacral region, stage 2: Secondary | ICD-10-CM | POA: Diagnosis not present

## 2016-10-24 DIAGNOSIS — E119 Type 2 diabetes mellitus without complications: Secondary | ICD-10-CM | POA: Diagnosis not present

## 2016-10-24 DIAGNOSIS — Z86711 Personal history of pulmonary embolism: Secondary | ICD-10-CM | POA: Diagnosis not present

## 2016-10-24 DIAGNOSIS — N390 Urinary tract infection, site not specified: Secondary | ICD-10-CM | POA: Diagnosis not present

## 2016-10-24 DIAGNOSIS — L03115 Cellulitis of right lower limb: Secondary | ICD-10-CM | POA: Diagnosis not present

## 2016-10-24 DIAGNOSIS — E785 Hyperlipidemia, unspecified: Secondary | ICD-10-CM | POA: Diagnosis not present

## 2016-10-24 DIAGNOSIS — K449 Diaphragmatic hernia without obstruction or gangrene: Secondary | ICD-10-CM | POA: Diagnosis not present

## 2016-10-25 DIAGNOSIS — Z86711 Personal history of pulmonary embolism: Secondary | ICD-10-CM | POA: Diagnosis not present

## 2016-10-25 DIAGNOSIS — L89152 Pressure ulcer of sacral region, stage 2: Secondary | ICD-10-CM | POA: Diagnosis not present

## 2016-10-25 DIAGNOSIS — I82409 Acute embolism and thrombosis of unspecified deep veins of unspecified lower extremity: Secondary | ICD-10-CM | POA: Diagnosis not present

## 2016-10-25 DIAGNOSIS — E785 Hyperlipidemia, unspecified: Secondary | ICD-10-CM | POA: Diagnosis not present

## 2016-10-25 DIAGNOSIS — L03115 Cellulitis of right lower limb: Secondary | ICD-10-CM | POA: Diagnosis not present

## 2016-10-25 DIAGNOSIS — I10 Essential (primary) hypertension: Secondary | ICD-10-CM | POA: Diagnosis not present

## 2016-10-25 DIAGNOSIS — N390 Urinary tract infection, site not specified: Secondary | ICD-10-CM | POA: Diagnosis not present

## 2016-10-25 DIAGNOSIS — E119 Type 2 diabetes mellitus without complications: Secondary | ICD-10-CM | POA: Diagnosis not present

## 2016-10-25 DIAGNOSIS — J449 Chronic obstructive pulmonary disease, unspecified: Secondary | ICD-10-CM | POA: Diagnosis not present

## 2016-10-25 DIAGNOSIS — K449 Diaphragmatic hernia without obstruction or gangrene: Secondary | ICD-10-CM | POA: Diagnosis not present

## 2016-10-25 DIAGNOSIS — M6281 Muscle weakness (generalized): Secondary | ICD-10-CM | POA: Diagnosis not present

## 2016-10-27 DIAGNOSIS — E119 Type 2 diabetes mellitus without complications: Secondary | ICD-10-CM | POA: Diagnosis not present

## 2016-10-27 DIAGNOSIS — K449 Diaphragmatic hernia without obstruction or gangrene: Secondary | ICD-10-CM | POA: Diagnosis not present

## 2016-10-27 DIAGNOSIS — E785 Hyperlipidemia, unspecified: Secondary | ICD-10-CM | POA: Diagnosis not present

## 2016-10-27 DIAGNOSIS — N390 Urinary tract infection, site not specified: Secondary | ICD-10-CM | POA: Diagnosis not present

## 2016-10-27 DIAGNOSIS — L03115 Cellulitis of right lower limb: Secondary | ICD-10-CM | POA: Diagnosis not present

## 2016-10-27 DIAGNOSIS — J449 Chronic obstructive pulmonary disease, unspecified: Secondary | ICD-10-CM | POA: Diagnosis not present

## 2016-10-27 DIAGNOSIS — I10 Essential (primary) hypertension: Secondary | ICD-10-CM | POA: Diagnosis not present

## 2016-10-27 DIAGNOSIS — Z86711 Personal history of pulmonary embolism: Secondary | ICD-10-CM | POA: Diagnosis not present

## 2016-10-27 DIAGNOSIS — L89152 Pressure ulcer of sacral region, stage 2: Secondary | ICD-10-CM | POA: Diagnosis not present

## 2016-11-02 DIAGNOSIS — E785 Hyperlipidemia, unspecified: Secondary | ICD-10-CM | POA: Diagnosis not present

## 2016-11-02 DIAGNOSIS — E119 Type 2 diabetes mellitus without complications: Secondary | ICD-10-CM | POA: Diagnosis not present

## 2016-11-02 DIAGNOSIS — Z86711 Personal history of pulmonary embolism: Secondary | ICD-10-CM | POA: Diagnosis not present

## 2016-11-02 DIAGNOSIS — I10 Essential (primary) hypertension: Secondary | ICD-10-CM | POA: Diagnosis not present

## 2016-11-02 DIAGNOSIS — L03115 Cellulitis of right lower limb: Secondary | ICD-10-CM | POA: Diagnosis not present

## 2016-11-02 DIAGNOSIS — L89152 Pressure ulcer of sacral region, stage 2: Secondary | ICD-10-CM | POA: Diagnosis not present

## 2016-11-02 DIAGNOSIS — K449 Diaphragmatic hernia without obstruction or gangrene: Secondary | ICD-10-CM | POA: Diagnosis not present

## 2016-11-02 DIAGNOSIS — J449 Chronic obstructive pulmonary disease, unspecified: Secondary | ICD-10-CM | POA: Diagnosis not present

## 2016-11-02 DIAGNOSIS — N390 Urinary tract infection, site not specified: Secondary | ICD-10-CM | POA: Diagnosis not present

## 2016-11-09 DIAGNOSIS — K449 Diaphragmatic hernia without obstruction or gangrene: Secondary | ICD-10-CM | POA: Diagnosis not present

## 2016-11-09 DIAGNOSIS — L89152 Pressure ulcer of sacral region, stage 2: Secondary | ICD-10-CM | POA: Diagnosis not present

## 2016-11-09 DIAGNOSIS — E119 Type 2 diabetes mellitus without complications: Secondary | ICD-10-CM | POA: Diagnosis not present

## 2016-11-09 DIAGNOSIS — E785 Hyperlipidemia, unspecified: Secondary | ICD-10-CM | POA: Diagnosis not present

## 2016-11-09 DIAGNOSIS — Z86711 Personal history of pulmonary embolism: Secondary | ICD-10-CM | POA: Diagnosis not present

## 2016-11-09 DIAGNOSIS — I10 Essential (primary) hypertension: Secondary | ICD-10-CM | POA: Diagnosis not present

## 2016-11-09 DIAGNOSIS — N390 Urinary tract infection, site not specified: Secondary | ICD-10-CM | POA: Diagnosis not present

## 2016-11-09 DIAGNOSIS — L03115 Cellulitis of right lower limb: Secondary | ICD-10-CM | POA: Diagnosis not present

## 2016-11-09 DIAGNOSIS — J449 Chronic obstructive pulmonary disease, unspecified: Secondary | ICD-10-CM | POA: Diagnosis not present

## 2016-11-16 DIAGNOSIS — J449 Chronic obstructive pulmonary disease, unspecified: Secondary | ICD-10-CM | POA: Diagnosis not present

## 2016-11-16 DIAGNOSIS — L89152 Pressure ulcer of sacral region, stage 2: Secondary | ICD-10-CM | POA: Diagnosis not present

## 2016-11-16 DIAGNOSIS — E119 Type 2 diabetes mellitus without complications: Secondary | ICD-10-CM | POA: Diagnosis not present

## 2016-11-16 DIAGNOSIS — I10 Essential (primary) hypertension: Secondary | ICD-10-CM | POA: Diagnosis not present

## 2016-11-16 DIAGNOSIS — Z86711 Personal history of pulmonary embolism: Secondary | ICD-10-CM | POA: Diagnosis not present

## 2016-11-16 DIAGNOSIS — K449 Diaphragmatic hernia without obstruction or gangrene: Secondary | ICD-10-CM | POA: Diagnosis not present

## 2016-11-16 DIAGNOSIS — E785 Hyperlipidemia, unspecified: Secondary | ICD-10-CM | POA: Diagnosis not present

## 2016-11-16 DIAGNOSIS — N390 Urinary tract infection, site not specified: Secondary | ICD-10-CM | POA: Diagnosis not present

## 2016-11-16 DIAGNOSIS — L03115 Cellulitis of right lower limb: Secondary | ICD-10-CM | POA: Diagnosis not present

## 2016-11-23 DIAGNOSIS — E785 Hyperlipidemia, unspecified: Secondary | ICD-10-CM | POA: Diagnosis not present

## 2016-11-23 DIAGNOSIS — K449 Diaphragmatic hernia without obstruction or gangrene: Secondary | ICD-10-CM | POA: Diagnosis not present

## 2016-11-23 DIAGNOSIS — L03115 Cellulitis of right lower limb: Secondary | ICD-10-CM | POA: Diagnosis not present

## 2016-11-23 DIAGNOSIS — J449 Chronic obstructive pulmonary disease, unspecified: Secondary | ICD-10-CM | POA: Diagnosis not present

## 2016-11-23 DIAGNOSIS — Z86711 Personal history of pulmonary embolism: Secondary | ICD-10-CM | POA: Diagnosis not present

## 2016-11-23 DIAGNOSIS — N390 Urinary tract infection, site not specified: Secondary | ICD-10-CM | POA: Diagnosis not present

## 2016-11-23 DIAGNOSIS — L89152 Pressure ulcer of sacral region, stage 2: Secondary | ICD-10-CM | POA: Diagnosis not present

## 2016-11-23 DIAGNOSIS — I10 Essential (primary) hypertension: Secondary | ICD-10-CM | POA: Diagnosis not present

## 2016-11-23 DIAGNOSIS — E119 Type 2 diabetes mellitus without complications: Secondary | ICD-10-CM | POA: Diagnosis not present

## 2016-11-25 DIAGNOSIS — L89152 Pressure ulcer of sacral region, stage 2: Secondary | ICD-10-CM | POA: Diagnosis not present

## 2016-11-25 DIAGNOSIS — I82409 Acute embolism and thrombosis of unspecified deep veins of unspecified lower extremity: Secondary | ICD-10-CM | POA: Diagnosis not present

## 2016-11-25 DIAGNOSIS — M6281 Muscle weakness (generalized): Secondary | ICD-10-CM | POA: Diagnosis not present

## 2016-11-25 DIAGNOSIS — J449 Chronic obstructive pulmonary disease, unspecified: Secondary | ICD-10-CM | POA: Diagnosis not present

## 2016-12-07 DIAGNOSIS — Z5181 Encounter for therapeutic drug level monitoring: Secondary | ICD-10-CM | POA: Diagnosis not present

## 2016-12-07 DIAGNOSIS — Z7901 Long term (current) use of anticoagulants: Secondary | ICD-10-CM | POA: Diagnosis not present

## 2016-12-25 DIAGNOSIS — M6281 Muscle weakness (generalized): Secondary | ICD-10-CM | POA: Diagnosis not present

## 2016-12-25 DIAGNOSIS — I82409 Acute embolism and thrombosis of unspecified deep veins of unspecified lower extremity: Secondary | ICD-10-CM | POA: Diagnosis not present

## 2016-12-25 DIAGNOSIS — J449 Chronic obstructive pulmonary disease, unspecified: Secondary | ICD-10-CM | POA: Diagnosis not present

## 2016-12-25 DIAGNOSIS — L89152 Pressure ulcer of sacral region, stage 2: Secondary | ICD-10-CM | POA: Diagnosis not present

## 2016-12-28 DIAGNOSIS — E109 Type 1 diabetes mellitus without complications: Secondary | ICD-10-CM | POA: Diagnosis not present

## 2016-12-28 DIAGNOSIS — Z0100 Encounter for examination of eyes and vision without abnormal findings: Secondary | ICD-10-CM | POA: Diagnosis not present

## 2016-12-28 DIAGNOSIS — I10 Essential (primary) hypertension: Secondary | ICD-10-CM | POA: Diagnosis not present

## 2016-12-30 DIAGNOSIS — E784 Other hyperlipidemia: Secondary | ICD-10-CM | POA: Diagnosis not present

## 2016-12-30 DIAGNOSIS — J440 Chronic obstructive pulmonary disease with acute lower respiratory infection: Secondary | ICD-10-CM | POA: Diagnosis not present

## 2016-12-30 DIAGNOSIS — I2699 Other pulmonary embolism without acute cor pulmonale: Secondary | ICD-10-CM | POA: Diagnosis not present

## 2016-12-30 DIAGNOSIS — F0280 Dementia in other diseases classified elsewhere without behavioral disturbance: Secondary | ICD-10-CM | POA: Diagnosis not present

## 2016-12-30 DIAGNOSIS — I10 Essential (primary) hypertension: Secondary | ICD-10-CM | POA: Diagnosis not present

## 2016-12-30 DIAGNOSIS — K210 Gastro-esophageal reflux disease with esophagitis: Secondary | ICD-10-CM | POA: Diagnosis not present

## 2016-12-30 DIAGNOSIS — E1165 Type 2 diabetes mellitus with hyperglycemia: Secondary | ICD-10-CM | POA: Diagnosis not present

## 2016-12-30 DIAGNOSIS — L570 Actinic keratosis: Secondary | ICD-10-CM | POA: Diagnosis not present

## 2017-01-11 ENCOUNTER — Encounter: Payer: Self-pay | Admitting: Family Medicine

## 2017-01-11 ENCOUNTER — Ambulatory Visit (INDEPENDENT_AMBULATORY_CARE_PROVIDER_SITE_OTHER): Payer: Medicare HMO | Admitting: Family Medicine

## 2017-01-11 VITALS — BP 110/72

## 2017-01-11 DIAGNOSIS — N3000 Acute cystitis without hematuria: Secondary | ICD-10-CM

## 2017-01-11 DIAGNOSIS — F0391 Unspecified dementia with behavioral disturbance: Secondary | ICD-10-CM

## 2017-01-11 DIAGNOSIS — R443 Hallucinations, unspecified: Secondary | ICD-10-CM

## 2017-01-11 MED ORDER — CEPHALEXIN 250 MG PO CAPS: 250.0000 mg | ORAL_CAPSULE | Freq: Four times a day (QID) | ORAL | 0 refills | Status: DC

## 2017-01-11 NOTE — Progress Notes (Signed)
   Subjective:    Patient ID: Natalie Rogers, female    DOB: [birth year], 83 y.o.   MRN: [id number]  HPI  Patient arrives with c/o hallucinations-daughter wonders if it is from a UTI. Patient is on Coumadin from blood clot in lungs. INR was 2.4 last week via Dr Alejandro MullingHisanni in [place]. She was told to recheck in 2 weeks. Patient is transferring to us Patient has multiple underlying problems including dementia, hallucinations in the evening, frequent urinary tract infections, hypertension, diabetes, hyperlipidemia, mild frailty related to age. Patient was in the hospital and [place] for cellulitis has been staying with local daughter in [place]. Patient was on glipizide but this was stopped because of low sugars. She does take metformin amazingly her kidney function is been relatively good she complains of some increase hallucinations not suicidal she is not harmful to herself or others. Her previous doctor recommended trying risperidone. Certainly increased risk of death with this family did not take this medication and transferred care to us. We took care of this patient several years ago before she moved away Review of Systems She currently denies any chest pain shortness breath headache fever chills sweats.    Objective:   Physical Exam Patient pleasant interactive mildly confused. Apparently having visual hallucinations not terrifying no violent episodes with all of this, abdomen soft  Reportedly INR 2.4 within the past week Unable to give urine specimen    Assessment & Plan:  Dementia with increase hallucination issues in the evening time. This can certainly go along with dementia but I do not recommend using antipsychotics. I recommend just behavioral modification It is possible it could be underlying UTI unable to give a urine specimen We will go ahead with antibiotics for the next week Repeat INR next week I agree with family stopping glipizide because of the risk for low  sugars. If high fevers progressive troubles or worse go to ER 25-30 minutes spent with family greater than half in discussion and getting reestablished

## 2017-01-17 ENCOUNTER — Ambulatory Visit: Payer: Medicare HMO | Admitting: Family Medicine

## 2017-01-18 ENCOUNTER — Telehealth: Payer: Self-pay | Admitting: Family Medicine

## 2017-01-18 ENCOUNTER — Emergency Department (HOSPITAL_COMMUNITY)
Admission: EM | Admit: 2017-01-18 | Discharge: 2017-01-18 | Disposition: A | Discharge status: Home / Self Care | Payer: Medicare HMO | Attending: Emergency Medicine | Admitting: Emergency Medicine

## 2017-01-18 ENCOUNTER — Emergency Department (HOSPITAL_COMMUNITY): Payer: Medicare HMO

## 2017-01-18 ENCOUNTER — Encounter (HOSPITAL_COMMUNITY): Payer: Self-pay | Admitting: Cardiology

## 2017-01-18 DIAGNOSIS — Z87891 Personal history of nicotine dependence: Secondary | ICD-10-CM | POA: Insufficient documentation

## 2017-01-18 DIAGNOSIS — Z7984 Long term (current) use of oral hypoglycemic drugs: Secondary | ICD-10-CM | POA: Diagnosis not present

## 2017-01-18 DIAGNOSIS — I10 Essential (primary) hypertension: Secondary | ICD-10-CM | POA: Insufficient documentation

## 2017-01-18 DIAGNOSIS — J449 Chronic obstructive pulmonary disease, unspecified: Secondary | ICD-10-CM | POA: Insufficient documentation

## 2017-01-18 DIAGNOSIS — Z7901 Long term (current) use of anticoagulants: Secondary | ICD-10-CM | POA: Diagnosis not present

## 2017-01-18 DIAGNOSIS — E860 Dehydration: Secondary | ICD-10-CM | POA: Diagnosis not present

## 2017-01-18 DIAGNOSIS — Z79899 Other long term (current) drug therapy: Secondary | ICD-10-CM | POA: Insufficient documentation

## 2017-01-18 DIAGNOSIS — S299XXA Unspecified injury of thorax, initial encounter: Secondary | ICD-10-CM | POA: Diagnosis not present

## 2017-01-18 DIAGNOSIS — E119 Type 2 diabetes mellitus without complications: Secondary | ICD-10-CM | POA: Insufficient documentation

## 2017-01-18 DIAGNOSIS — F4489 Other dissociative and conversion disorders: Secondary | ICD-10-CM | POA: Diagnosis not present

## 2017-01-18 DIAGNOSIS — R531 Weakness: Secondary | ICD-10-CM | POA: Diagnosis not present

## 2017-01-18 DIAGNOSIS — R4182 Altered mental status, unspecified: Secondary | ICD-10-CM | POA: Diagnosis not present

## 2017-01-18 DIAGNOSIS — I6789 Other cerebrovascular disease: Secondary | ICD-10-CM | POA: Diagnosis not present

## 2017-01-18 LAB — CBC
HCT: 39.8 % (ref 36.0–46.0)
Hemoglobin: 13.2 g/dL (ref 12.0–15.0)
MCH: 28.2 pg (ref 26.0–34.0)
MCHC: 33.2 g/dL (ref 30.0–36.0)
MCV: 85 fL (ref 78.0–100.0)
PLATELETS: 230 10*3/uL (ref 150–400)
RBC: 4.68 MIL/uL (ref 3.87–5.11)
RDW: 15.6 % — ABNORMAL HIGH (ref 11.5–15.5)
WBC: 9.4 10*3/uL (ref 4.0–10.5)

## 2017-01-18 LAB — COMPREHENSIVE METABOLIC PANEL
ALK PHOS: 36 U/L — AB (ref 38–126)
ALT: 12 U/L — AB (ref 14–54)
AST: 21 U/L (ref 15–41)
Albumin: 3.6 g/dL (ref 3.5–5.0)
Anion gap: 11 (ref 5–15)
BUN: <5 mg/dL — ABNORMAL LOW (ref 6–20)
CALCIUM: 8.8 mg/dL — AB (ref 8.9–10.3)
CO2: 33 mmol/L — ABNORMAL HIGH (ref 22–32)
CREATININE: 0.57 mg/dL (ref 0.44–1.00)
Chloride: 93 mmol/L — ABNORMAL LOW (ref 101–111)
Glucose, Bld: 145 mg/dL — ABNORMAL HIGH (ref 65–99)
Potassium: 3.1 mmol/L — ABNORMAL LOW (ref 3.5–5.1)
Sodium: 137 mmol/L (ref 135–145)
Total Bilirubin: 0.8 mg/dL (ref 0.3–1.2)
Total Protein: 6.7 g/dL (ref 6.5–8.1)

## 2017-01-18 LAB — URINALYSIS, ROUTINE W REFLEX MICROSCOPIC
Bacteria, UA: NONE SEEN
Bilirubin Urine: NEGATIVE
GLUCOSE, UA: 50 mg/dL — AB
HGB URINE DIPSTICK: NEGATIVE
KETONES UR: 5 mg/dL — AB
Nitrite: NEGATIVE
PROTEIN: NEGATIVE mg/dL
Specific Gravity, Urine: 1.009 (ref 1.005–1.030)
pH: 7 (ref 5.0–8.0)

## 2017-01-18 LAB — PROTIME-INR
INR: 2.03
Prothrombin Time: 23.3 seconds — ABNORMAL HIGH (ref 11.4–15.2)

## 2017-01-18 MED ORDER — SODIUM CHLORIDE 0.9 % IV BOLUS (SEPSIS)
1000.0000 mL | Freq: Once | INTRAVENOUS | Status: AC
  Administered 2017-01-18: 1000 mL via INTRAVENOUS

## 2017-01-18 NOTE — ED Notes (Signed)
Daughter returned. Pt helped in into wheel chair and rolled to car without incident.

## 2017-01-18 NOTE — ED Notes (Signed)
Called home number, family member did'nt know where the daughter was at.

## 2017-01-18 NOTE — Telephone Encounter (Signed)
Spoke with patient's daughter and she stated that patient is unable to get out of bed today and patient has been very loopy for the past few days. She has been treated recently for a UTI and is on her last dose of antibiotics today. Patient's daughter also states that patient has been up at night talking to things that are not there all night she held her trazdone last night and gave patient 2 melatonin, and 1  tylenol PM's last night and patient still did not sleep. FYI: patient also had a fall yesterday. Please advise?

## 2017-01-18 NOTE — ED Provider Notes (Signed)
[facility]-EMERGENCY DEPT Provider Note   CSN: [id number] Arrival date & time: 01/18/17  1658     History   Chief Complaint Chief Complaint  Patient presents with  . Altered Mental Status    HPI Natalie Rogers is a 83 y.o. female.  The history is provided by the patient.  Altered Mental Status   This is a new problem. The current episode started more than 2 days ago. The problem has not changed since onset.Associated symptoms include confusion, weakness and hallucinations. Her past medical history is significant for hypertension and COPD.   83 year old female with history of DM, HTN, HLD, PE on coumadin, and COPD who presents with AMS. History provided by patient's daughter who is her caregiver. Patient seen by Dr. Gerda Diss one week ago for acting "loopy" and diagnosed with UTI. Taking course of keflex, with last dose today. Over past few days, patient has had episodes where she is talking to people that aren't there. She has been "loopy" but worse per daughter. She has been very weak and unable to get out of bed today. No fever or chills, cough, dyspnea, chest pain, n/v/d, abdominal pain. Still has mild dysuria. Patient also had fall yesterday with headstrike but no LOC.  Past Medical History:  Diagnosis Date  . COPD (chronic obstructive pulmonary disease) (HCC)   . DVT (deep venous thrombosis) (HCC)   . GERD (gastroesophageal reflux disease)   . HLD (hyperlipidemia)   . HTN (hypertension)   . PE (pulmonary embolism)    on chronic anticoagulation  . Type 2 diabetes mellitus [facility])     Patient Active Problem List   Diagnosis Date Noted  . Cellulitis 08/20/2016  . Lower back pain 01/06/2016  . DDD (degenerative disc disease), lumbar 01/06/2016  . Elevated troponin 01/06/2016  . Leukocytosis 01/06/2016  . Type 2 diabetes mellitus (HCC) 01/04/2016  . Acute encephalopathy 01/04/2016  . Acute pyelonephritis 01/04/2016  . HTN (hypertension) 01/04/2016  . HLD (hyperlipidemia)  01/04/2016  . GERD (gastroesophageal reflux disease) 01/04/2016    Past Surgical History:  Procedure Laterality Date  . ABDOMINAL HYSTERECTOMY    . CHOLECYSTECTOMY      OB History    No data available       Home Medications    Prior to Admission medications   Medication Sig Start Date End Date Taking? Authorizing Provider  acetaminophen (TYLENOL) 500 MG tablet Take 500 mg by mouth every 6 (six) hours as needed for mild pain or headache.   Yes [provider]  cephALEXin (KEFLEX) 250 MG capsule Take 1 capsule (250 mg total) by mouth 4 (four) times daily. 01/11/17  Yes Luking, Scott A, MD  donepezil (ARICEPT) 10 MG tablet Take 1 tablet by mouth at bedtime. 12/30/16  Yes [provider]  lisinopril (PRINIVIL,ZESTRIL) 40 MG tablet Take 40 mg by mouth daily. 01/12/17  Yes [provider]  Melatonin 10 MG TABS Take by mouth. At bedtime   Yes [provider]  metFORMIN (GLUCOPHAGE) 500 MG tablet Take 1 tablet by mouth 2 (two) times daily. 11/03/16  Yes [provider]  metoprolol succinate (TOPROL-XL) 25 MG 24 hr tablet Take 25 mg by mouth daily.   Yes [provider]  pantoprazole (PROTONIX) 40 MG tablet Take 40 mg by mouth daily.   Yes [provider]  traZODone (DESYREL) 50 MG tablet Take 25 mg by mouth at bedtime.    Yes [provider]  warfarin (COUMADIN) 5 MG tablet Take  5 mg by mouth See admin instructions. 5mg  on Sat and Sun. 2.5mg  Mon-Fri. 11/25/16  Yes [provider]  feeding supplement, GLUCERNA SHAKE, (GLUCERNA SHAKE) LIQD Take 237 mLs by mouth 3 (three) times daily between meals. 01/06/16   Katharina CaperVaickute, Rima, MD  lidocaine (LIDODERM) 5 % Place 1 patch onto the skin daily. Remove & Discard patch within 12 hours or as directed by MD 01/06/16   Katharina CaperVaickute, Rima, MD    Family History Family History  Problem Relation Age of Onset  . Heart disease Unknown   . Alzheimer's disease Unknown   . Stroke Unknown     . Breast cancer Unknown     Social History Social History  Substance Use Topics  . Smoking status: Former Games developermoker  . Smokeless tobacco: Never Used  . Alcohol use No     Allergies   Penicillins; Codeine; and Demerol [meperidine]   Review of Systems Review of Systems  Constitutional: Negative for fever.  HENT: Negative for congestion.   Respiratory: Negative for cough and shortness of breath.   Cardiovascular: Negative for chest pain.  Gastrointestinal: Negative for abdominal pain, diarrhea and vomiting.  Genitourinary: Positive for dysuria.  Skin: Negative for wound.  Allergic/Immunologic: Negative for immunocompromised state.  Neurological: Positive for weakness.  Hematological: Bruises/bleeds easily.  Psychiatric/Behavioral: Positive for confusion and hallucinations.  All other systems reviewed and are negative.    Physical Exam Updated Vital Signs BP (!) 213/98   Pulse 66   Temp 98.3 F (36.8 C) (Oral)   Resp 16   Ht 5\' 4"  (1.626 m)   Wt 66.2 kg (146 lb)   SpO2 95%   BMI 25.06 kg/m   Physical Exam Physical Exam  Nursing note and vitals reviewed. Constitutional: Well developed, well nourished, non-toxic, and in no acute distress Head: Normocephalic and atraumatic.  Mouth/Throat: Oropharynx is clear and moist.  Neck: Normal range of motion. Neck supple.  Cardiovascular: Normal rate and regular rhythm.   Pulmonary/Chest: Effort normal and breath sounds normal.  Abdominal: Soft. There is no tenderness. There is no rebound and no guarding.  Musculoskeletal: Normal range of motion.  Neurological: Alert, oriented to self and place, only oriented partially to situation, sensation to light touch in tact throughout, PERRL,  no facial droop, fluent speech, moves all extremities symmetrically to command, equal handgrips bilaterally, equal ankle dorsi/plantarflexion bilaterally Skin: Skin is warm and dry.  Psychiatric: Cooperative   ED Treatments / Results   Labs (all labs ordered are listed, but only abnormal results are displayed) Labs Reviewed  COMPREHENSIVE METABOLIC PANEL - Abnormal; Notable for the following:       Result Value   Potassium 3.1 (*)    Chloride 93 (*)    CO2 33 (*)    Glucose, Bld 145 (*)    BUN <5 (*)    Calcium 8.8 (*)    ALT 12 (*)    Alkaline Phosphatase 36 (*)    All other components within normal limits  CBC - Abnormal; Notable for the following:    RDW 15.6 (*)    All other components within normal limits  URINALYSIS, ROUTINE W REFLEX MICROSCOPIC - Abnormal; Notable for the following:    Glucose, UA 50 (*)    Ketones, ur 5 (*)    Leukocytes, UA TRACE (*)    Squamous Epithelial / LPF 0-5 (*)    All other components within normal limits  PROTIME-INR - Abnormal; Notable for the following:    Prothrombin Time 23.3 (*)  All other components within normal limits  URINE CULTURE    EKG  EKG Interpretation  Date/Time:  Wednesday Jan 18 2017 17:06:43 EDT Ventricular Rate:  59 PR Interval:    QRS Duration: 102 QT Interval:  496 QTC Calculation: 492 R Axis:   -35 Text Interpretation:  Sinus rhythm Incomplete RBBB and LAFB Anteroseptal infarct, age indeterminate Confirmed by Crista Curb [phone]) on 01/18/2017 6:26:52 PM       Radiology Dg Chest 2 View  Result Date: 01/18/2017 CLINICAL DATA:  Increasing confusion.  Status post fall EXAM: CHEST  2 VIEW COMPARISON:  None FINDINGS: The heart size and mediastinal contours are within normal limits. Aortic atherosclerosis. Both lungs are clear. The bones appear diffusely osteopenic. There is degenerative disc disease identified. IMPRESSION: No active cardiopulmonary disease. Aortic Atherosclerosis (ICD10-I70.0). Electronically Signed   By: Signa Kell M.D.   On: 01/18/2017 18:51   Ct Head Wo Contrast  Result Date: 01/18/2017 CLINICAL DATA:  83 year old and altered mental status. EXAM: CT HEAD WITHOUT CONTRAST TECHNIQUE: Contiguous axial images were obtained  from the base of the skull through the vertex without intravenous contrast. COMPARISON:  09/25/2016 FINDINGS: Brain: Diffuse low-density throughout the white matter suggestive for chronic changes. Stable mild atrophy. No evidence for acute hemorrhage, mass lesion, midline shift, hydrocephalus or large infarct. Vascular: No hyperdense vessel or unexpected calcification. Skull: Normal. Negative for fracture or focal lesion. Sinuses/Orbits: No acute finding. Other: None. IMPRESSION: No acute intracranial abnormality. Stable atrophy.  Evidence for chronic small vessel ischemic changes. Electronically Signed   By: Richarda Overlie M.D.   On: 01/18/2017 19:01    Procedures Procedures (including critical care time)  Medications Ordered in ED Medications  sodium chloride 0.9 % bolus 1,000 mL (0 mLs Intravenous Stopped 01/18/17 2020)     Initial Impression / Assessment and Plan / ED Course  I have reviewed the triage vital signs and the nursing notes.  Pertinent labs & imaging results that were available during my care of the patient were reviewed by me and considered in my medical decision making (see chart for details).     Presenting with generalized weakness and fatigue. Mentating normally in the ED. Afebrile, and hemodynamically stable. No focal neurological deficits. Exam otherwise unremarkable. Given recent fall CT head obtained to rule out intracranial hemorrhage. CT is visualized and shows no acute intracranial processes. Workup revealing no major evidence of infection. UA is not convincing for infection, and chest x-ray shows no acute cardiopulmonary processes. Small amount of ketones in her urine which may suggest dehydration but she has normal renal function. Given some IV fluids, and she feels significantly improved. She has been able to ambulate steadily with her walker, which is her baseline. This time she is felt stable for discharge home. Strict return and follow-up instructions reviewed. She  expressed understanding of all discharge instructions and felt comfortable with the plan of care.   Final Clinical Impressions(s) / ED Diagnoses   Final diagnoses:  General weakness  Dehydration    New Prescriptions New Prescriptions   No medications on file     Lavera Guise, MD 01/18/17 2027

## 2017-01-18 NOTE — Telephone Encounter (Signed)
In this particular situation there are too many different issues going on that could be a sign of serious issue. I think it would be best for this patient be evaluated [facility] the [facility] or [facility]

## 2017-01-18 NOTE — Telephone Encounter (Signed)
Spoke with patient's daughter and advised her per Dr.Scott Luking- In this particular situation there are too many different issues going on that could be a sign of serious issue. Dr.Scott thinks  it would be best for this patient be evaluated with the emergency [facility] or [facility] via EMS. Patient's daughter verbalized understanding.

## 2017-01-18 NOTE — ED Notes (Signed)
Called daughter's cell phone and left message on phone that pt was ready for discharge.

## 2017-01-18 NOTE — Telephone Encounter (Signed)
Pt's daughter is requesting a call from the nurse regarding the pt. Daughter states that she is unable to get out of the bed.

## 2017-01-18 NOTE — ED Notes (Signed)
Pt was bale to stand up on her own and walked out the room using a walker wit out incident.

## 2017-01-18 NOTE — ED Triage Notes (Signed)
Daughter called EMS for increasing confusion.  Pt fell yesterday but refused transport to hospital.  cbg 165.  Pt c/o  Right heel pain.  Pt was able to stand to get on stretcher at residence.

## 2017-01-18 NOTE — Discharge Instructions (Signed)
Your UTI appears to have cleared. Your work-up today is overall reassuring. Your urine suggest dehydration. Please drink plenty of fluids. Please follow-up closely with Dr. Gerda DissLuking this week for recheck.   Return for worsening symptoms, including fever, worsening confusion, or any other symptoms concerning to you.

## 2017-01-19 ENCOUNTER — Encounter: Payer: Self-pay | Admitting: Family Medicine

## 2017-01-19 ENCOUNTER — Ambulatory Visit (INDEPENDENT_AMBULATORY_CARE_PROVIDER_SITE_OTHER): Payer: Medicare HMO | Admitting: Family Medicine

## 2017-01-19 VITALS — BP 136/74 | Ht 65.0 in | Wt 142.1 lb

## 2017-01-19 DIAGNOSIS — E876 Hypokalemia: Secondary | ICD-10-CM

## 2017-01-19 DIAGNOSIS — R270 Ataxia, unspecified: Secondary | ICD-10-CM | POA: Diagnosis not present

## 2017-01-19 DIAGNOSIS — R531 Weakness: Secondary | ICD-10-CM

## 2017-01-19 MED ORDER — POTASSIUM CHLORIDE ER 10 MEQ PO TBCR: 10.0000 meq | EXTENDED_RELEASE_TABLET | Freq: Every day | ORAL | 0 refills | Status: DC

## 2017-01-19 MED ORDER — TRAZODONE HCL 50 MG PO TABS: 50.0000 mg | ORAL_TABLET | Freq: Every evening | ORAL | 12 refills | Status: DC | PRN

## 2017-01-19 MED ORDER — IPRATROPIUM BROMIDE 0.03 % NA SOLN: 2.0000 | Freq: Two times a day (BID) | NASAL | 12 refills | Status: AC

## 2017-01-19 NOTE — Progress Notes (Signed)
   Subjective:    Patient ID: Natalie Rogers, female    DOB: 1923/11/05, 83 y.o.   MRN: [id number]  HPI  Patient in today for a ER follow up for confusion and post fall. [facility].  Patient was seen in the ER was evaluated for the possibility of a hematoma/subdural hematoma she was also evaluated for possibility of sepsis and urinary tract infection the only thing that was found was a low potassium and possible dehydration she was given a bag of fluid and sent home her previous blood work that showed a normal-looking potassium.   Also has concern of bilateral foot pain-patient having bilateral heel pain foot pain hurts with certain movements. Been present over the past several weeks no known injury. Has tried some various cold compresses and such without much success  Also having, arm and hand weakness. Not walking real well having a difficult time using a walker difficult time holding a cup scan that was done in the ER did not show any stroke  Patient also has difficult time sleeping at night with moderate sundowners they've tried melatonin and occasionally Benadryl. They wondered if there is any particular medications. Her daughter Natalie Rogers is here with her today. Review of Systems Patient denies headaches fevers chills vomiting diarrhea energy level subpar    Objective:   Physical Exam Patient alert interactive. Neck no masses lungs are clear no crackles heart is regular pulse normal extremities no edema she has venous stasis changes in her feet she has some tenderness in the distal portion of her feet in the metatarsal region she also has some tenderness in the heel  Face-to-face evaluation was done for the purpose of evaluation to see if she would benefit from home health. Patient was in the ER having significant trouble with weakness. I recommend for the patient to go forward with home health evaluation for her ataxia along with physical therapy. Patient is  homebound it is difficult for her to get back in 4 to any place     Assessment & Plan:  Dementia yeah-continue current medication Sundowners with insomnia I do not recommend any prescription medicine other than trazodone 50 mg daily at bedtime I do not recommend benzodiazepines I would caution against Benadryl. May use melatonin when necessary There is no evidence of any type of sepsis or underlying infection Low potassium in the ER potassium supplement for the next 5 days home health to see the patient somewhere in the next 1-2 weeks to check a metabolic 7 when they see the patient  Heel pain and discomfort I do not find any evidence of any type of fracture I think it's just she is losing so much fat pad it causes her heels to have discomfort from walking  The patient would benefit from some physical therapy and home health evaluation she has moderate ataxia difficult time walking without a walker and she is getting weaker  Patient may well benefit from ongoing potassium supplement if her potassium is low on follow-up but patient is currently on ACE inhibitor  Patient will follow-up in 6-8 weeks for recheck

## 2017-01-20 LAB — URINE CULTURE: Culture: NO GROWTH

## 2017-01-25 ENCOUNTER — Telehealth: Payer: Self-pay | Admitting: Family Medicine

## 2017-01-25 DIAGNOSIS — E119 Type 2 diabetes mellitus without complications: Secondary | ICD-10-CM | POA: Diagnosis not present

## 2017-01-25 DIAGNOSIS — R531 Weakness: Secondary | ICD-10-CM | POA: Diagnosis not present

## 2017-01-25 DIAGNOSIS — L89152 Pressure ulcer of sacral region, stage 2: Secondary | ICD-10-CM | POA: Diagnosis not present

## 2017-01-25 DIAGNOSIS — Z87440 Personal history of urinary (tract) infections: Secondary | ICD-10-CM | POA: Diagnosis not present

## 2017-01-25 DIAGNOSIS — I10 Essential (primary) hypertension: Secondary | ICD-10-CM | POA: Diagnosis not present

## 2017-01-25 DIAGNOSIS — Z7984 Long term (current) use of oral hypoglycemic drugs: Secondary | ICD-10-CM | POA: Diagnosis not present

## 2017-01-25 DIAGNOSIS — Z7901 Long term (current) use of anticoagulants: Secondary | ICD-10-CM | POA: Diagnosis not present

## 2017-01-25 DIAGNOSIS — I82409 Acute embolism and thrombosis of unspecified deep veins of unspecified lower extremity: Secondary | ICD-10-CM | POA: Diagnosis not present

## 2017-01-25 DIAGNOSIS — M6281 Muscle weakness (generalized): Secondary | ICD-10-CM | POA: Diagnosis not present

## 2017-01-25 DIAGNOSIS — M5136 Other intervertebral disc degeneration, lumbar region: Secondary | ICD-10-CM | POA: Diagnosis not present

## 2017-01-25 DIAGNOSIS — J449 Chronic obstructive pulmonary disease, unspecified: Secondary | ICD-10-CM | POA: Diagnosis not present

## 2017-01-25 DIAGNOSIS — Z86711 Personal history of pulmonary embolism: Secondary | ICD-10-CM | POA: Diagnosis not present

## 2017-01-25 NOTE — Telephone Encounter (Signed)
Verbal order for plan of care given to [facility]Karen at [facility]

## 2017-01-25 NOTE — Telephone Encounter (Signed)
[facility]aren at [facility] called requesting verbal order to confirm POC.  Clydie BraunKaren is requesting nursing for 2 times a week for 4 weeks, upon approval from [employer].

## 2017-01-25 NOTE — Telephone Encounter (Addendum)
[facility]urse [facility] needs verbal order for plan of care. Nurse is requesting nursing for 2 times a week for 4 weeks, upon approval from [facility].

## 2017-01-25 NOTE — Telephone Encounter (Signed)
ok 

## 2017-01-26 ENCOUNTER — Telehealth: Payer: Self-pay | Admitting: Family Medicine

## 2017-01-26 NOTE — Telephone Encounter (Signed)
Patient seen 01/11/17 as new patient with hallucinations- see note

## 2017-01-26 NOTE — Telephone Encounter (Signed)
Daughter called stating that she is still having hallucinations or something and wonders if there could possibly be a drug reaction causing this. Please advise. Daughter is aware that Dr. Lorin PicketScott is out.

## 2017-01-26 NOTE — Telephone Encounter (Signed)
Natalie Rogers indicated in his notes that hallucinations were related to her dementia and possibly to UTI at that time. Please forward for his review on Monday. Family to take patient to ED this weekend if fever or other symptoms.

## 2017-01-26 NOTE — Telephone Encounter (Signed)
Daughter notified that Dr Lorin PicketScott will review message on Monday. Daughter verbalized understanding and stated she will take her to ER if she develops fever or other issues.

## 2017-01-27 ENCOUNTER — Telehealth: Payer: Self-pay | Admitting: Family Medicine

## 2017-01-27 DIAGNOSIS — J449 Chronic obstructive pulmonary disease, unspecified: Secondary | ICD-10-CM | POA: Diagnosis not present

## 2017-01-27 DIAGNOSIS — E119 Type 2 diabetes mellitus without complications: Secondary | ICD-10-CM | POA: Diagnosis not present

## 2017-01-27 DIAGNOSIS — Z87440 Personal history of urinary (tract) infections: Secondary | ICD-10-CM | POA: Diagnosis not present

## 2017-01-27 DIAGNOSIS — R531 Weakness: Secondary | ICD-10-CM | POA: Diagnosis not present

## 2017-01-27 DIAGNOSIS — I10 Essential (primary) hypertension: Secondary | ICD-10-CM | POA: Diagnosis not present

## 2017-01-27 DIAGNOSIS — Z7901 Long term (current) use of anticoagulants: Secondary | ICD-10-CM | POA: Diagnosis not present

## 2017-01-27 DIAGNOSIS — Z7984 Long term (current) use of oral hypoglycemic drugs: Secondary | ICD-10-CM | POA: Diagnosis not present

## 2017-01-27 DIAGNOSIS — Z86711 Personal history of pulmonary embolism: Secondary | ICD-10-CM | POA: Diagnosis not present

## 2017-01-27 DIAGNOSIS — M5136 Other intervertebral disc degeneration, lumbar region: Secondary | ICD-10-CM | POA: Diagnosis not present

## 2017-01-27 NOTE — Telephone Encounter (Signed)
Received a phone call from the Physical Therapist at [facility] stating patient had a recent fall and he would like to initiate a fall prevention program, gait training program for the next 4 weeks for patient. May we give verbal orders.   Call Danny at [phone]

## 2017-01-29 NOTE — Telephone Encounter (Signed)
May have verbal order for this 

## 2017-01-30 NOTE — Telephone Encounter (Signed)
Discussed with Danny

## 2017-01-31 DIAGNOSIS — Z86711 Personal history of pulmonary embolism: Secondary | ICD-10-CM | POA: Diagnosis not present

## 2017-01-31 DIAGNOSIS — R531 Weakness: Secondary | ICD-10-CM | POA: Diagnosis not present

## 2017-01-31 DIAGNOSIS — E119 Type 2 diabetes mellitus without complications: Secondary | ICD-10-CM | POA: Diagnosis not present

## 2017-01-31 DIAGNOSIS — Z87440 Personal history of urinary (tract) infections: Secondary | ICD-10-CM | POA: Diagnosis not present

## 2017-01-31 DIAGNOSIS — Z7984 Long term (current) use of oral hypoglycemic drugs: Secondary | ICD-10-CM | POA: Diagnosis not present

## 2017-01-31 DIAGNOSIS — J449 Chronic obstructive pulmonary disease, unspecified: Secondary | ICD-10-CM | POA: Diagnosis not present

## 2017-01-31 DIAGNOSIS — Z7901 Long term (current) use of anticoagulants: Secondary | ICD-10-CM | POA: Diagnosis not present

## 2017-01-31 DIAGNOSIS — M5136 Other intervertebral disc degeneration, lumbar region: Secondary | ICD-10-CM | POA: Diagnosis not present

## 2017-01-31 DIAGNOSIS — I10 Essential (primary) hypertension: Secondary | ICD-10-CM | POA: Diagnosis not present

## 2017-02-02 DIAGNOSIS — Z7984 Long term (current) use of oral hypoglycemic drugs: Secondary | ICD-10-CM | POA: Diagnosis not present

## 2017-02-02 DIAGNOSIS — Z7901 Long term (current) use of anticoagulants: Secondary | ICD-10-CM | POA: Diagnosis not present

## 2017-02-02 DIAGNOSIS — E119 Type 2 diabetes mellitus without complications: Secondary | ICD-10-CM | POA: Diagnosis not present

## 2017-02-02 DIAGNOSIS — J449 Chronic obstructive pulmonary disease, unspecified: Secondary | ICD-10-CM | POA: Diagnosis not present

## 2017-02-02 DIAGNOSIS — Z87440 Personal history of urinary (tract) infections: Secondary | ICD-10-CM | POA: Diagnosis not present

## 2017-02-02 DIAGNOSIS — Z86711 Personal history of pulmonary embolism: Secondary | ICD-10-CM | POA: Diagnosis not present

## 2017-02-02 DIAGNOSIS — R531 Weakness: Secondary | ICD-10-CM | POA: Diagnosis not present

## 2017-02-02 DIAGNOSIS — I10 Essential (primary) hypertension: Secondary | ICD-10-CM | POA: Diagnosis not present

## 2017-02-02 DIAGNOSIS — M5136 Other intervertebral disc degeneration, lumbar region: Secondary | ICD-10-CM | POA: Diagnosis not present

## 2017-02-03 DIAGNOSIS — Z87440 Personal history of urinary (tract) infections: Secondary | ICD-10-CM | POA: Diagnosis not present

## 2017-02-03 DIAGNOSIS — I10 Essential (primary) hypertension: Secondary | ICD-10-CM | POA: Diagnosis not present

## 2017-02-03 DIAGNOSIS — Z7984 Long term (current) use of oral hypoglycemic drugs: Secondary | ICD-10-CM | POA: Diagnosis not present

## 2017-02-03 DIAGNOSIS — M5136 Other intervertebral disc degeneration, lumbar region: Secondary | ICD-10-CM | POA: Diagnosis not present

## 2017-02-03 DIAGNOSIS — E119 Type 2 diabetes mellitus without complications: Secondary | ICD-10-CM | POA: Diagnosis not present

## 2017-02-03 DIAGNOSIS — R531 Weakness: Secondary | ICD-10-CM | POA: Diagnosis not present

## 2017-02-03 DIAGNOSIS — J449 Chronic obstructive pulmonary disease, unspecified: Secondary | ICD-10-CM | POA: Diagnosis not present

## 2017-02-03 DIAGNOSIS — Z7901 Long term (current) use of anticoagulants: Secondary | ICD-10-CM | POA: Diagnosis not present

## 2017-02-03 DIAGNOSIS — Z86711 Personal history of pulmonary embolism: Secondary | ICD-10-CM | POA: Diagnosis not present

## 2017-02-04 ENCOUNTER — Other Ambulatory Visit: Payer: Self-pay | Admitting: Family Medicine

## 2017-02-06 DIAGNOSIS — M5136 Other intervertebral disc degeneration, lumbar region: Secondary | ICD-10-CM | POA: Diagnosis not present

## 2017-02-06 DIAGNOSIS — I10 Essential (primary) hypertension: Secondary | ICD-10-CM | POA: Diagnosis not present

## 2017-02-06 DIAGNOSIS — Z86711 Personal history of pulmonary embolism: Secondary | ICD-10-CM | POA: Diagnosis not present

## 2017-02-06 DIAGNOSIS — R531 Weakness: Secondary | ICD-10-CM | POA: Diagnosis not present

## 2017-02-06 DIAGNOSIS — E119 Type 2 diabetes mellitus without complications: Secondary | ICD-10-CM | POA: Diagnosis not present

## 2017-02-06 DIAGNOSIS — J449 Chronic obstructive pulmonary disease, unspecified: Secondary | ICD-10-CM | POA: Diagnosis not present

## 2017-02-06 DIAGNOSIS — Z7984 Long term (current) use of oral hypoglycemic drugs: Secondary | ICD-10-CM | POA: Diagnosis not present

## 2017-02-06 DIAGNOSIS — Z7901 Long term (current) use of anticoagulants: Secondary | ICD-10-CM | POA: Diagnosis not present

## 2017-02-06 DIAGNOSIS — Z87440 Personal history of urinary (tract) infections: Secondary | ICD-10-CM | POA: Diagnosis not present

## 2017-02-07 DIAGNOSIS — I10 Essential (primary) hypertension: Secondary | ICD-10-CM | POA: Diagnosis not present

## 2017-02-07 DIAGNOSIS — Z7984 Long term (current) use of oral hypoglycemic drugs: Secondary | ICD-10-CM | POA: Diagnosis not present

## 2017-02-07 DIAGNOSIS — M5136 Other intervertebral disc degeneration, lumbar region: Secondary | ICD-10-CM | POA: Diagnosis not present

## 2017-02-07 DIAGNOSIS — Z86711 Personal history of pulmonary embolism: Secondary | ICD-10-CM | POA: Diagnosis not present

## 2017-02-07 DIAGNOSIS — Z7901 Long term (current) use of anticoagulants: Secondary | ICD-10-CM | POA: Diagnosis not present

## 2017-02-07 DIAGNOSIS — R531 Weakness: Secondary | ICD-10-CM | POA: Diagnosis not present

## 2017-02-07 DIAGNOSIS — E119 Type 2 diabetes mellitus without complications: Secondary | ICD-10-CM | POA: Diagnosis not present

## 2017-02-07 DIAGNOSIS — Z87440 Personal history of urinary (tract) infections: Secondary | ICD-10-CM | POA: Diagnosis not present

## 2017-02-07 DIAGNOSIS — J449 Chronic obstructive pulmonary disease, unspecified: Secondary | ICD-10-CM | POA: Diagnosis not present

## 2017-02-08 DIAGNOSIS — R531 Weakness: Secondary | ICD-10-CM | POA: Diagnosis not present

## 2017-02-08 DIAGNOSIS — Z86711 Personal history of pulmonary embolism: Secondary | ICD-10-CM | POA: Diagnosis not present

## 2017-02-08 DIAGNOSIS — J449 Chronic obstructive pulmonary disease, unspecified: Secondary | ICD-10-CM | POA: Diagnosis not present

## 2017-02-08 DIAGNOSIS — I10 Essential (primary) hypertension: Secondary | ICD-10-CM | POA: Diagnosis not present

## 2017-02-08 DIAGNOSIS — E119 Type 2 diabetes mellitus without complications: Secondary | ICD-10-CM | POA: Diagnosis not present

## 2017-02-08 DIAGNOSIS — Z7984 Long term (current) use of oral hypoglycemic drugs: Secondary | ICD-10-CM | POA: Diagnosis not present

## 2017-02-08 DIAGNOSIS — M5136 Other intervertebral disc degeneration, lumbar region: Secondary | ICD-10-CM | POA: Diagnosis not present

## 2017-02-08 DIAGNOSIS — Z7901 Long term (current) use of anticoagulants: Secondary | ICD-10-CM | POA: Diagnosis not present

## 2017-02-08 DIAGNOSIS — Z87440 Personal history of urinary (tract) infections: Secondary | ICD-10-CM | POA: Diagnosis not present

## 2017-02-09 ENCOUNTER — Telehealth: Payer: Self-pay | Admitting: Family Medicine

## 2017-02-09 DIAGNOSIS — Z7984 Long term (current) use of oral hypoglycemic drugs: Secondary | ICD-10-CM | POA: Diagnosis not present

## 2017-02-09 DIAGNOSIS — M5136 Other intervertebral disc degeneration, lumbar region: Secondary | ICD-10-CM | POA: Diagnosis not present

## 2017-02-09 DIAGNOSIS — I10 Essential (primary) hypertension: Secondary | ICD-10-CM | POA: Diagnosis not present

## 2017-02-09 DIAGNOSIS — Z87440 Personal history of urinary (tract) infections: Secondary | ICD-10-CM | POA: Diagnosis not present

## 2017-02-09 DIAGNOSIS — Z86711 Personal history of pulmonary embolism: Secondary | ICD-10-CM | POA: Diagnosis not present

## 2017-02-09 DIAGNOSIS — R531 Weakness: Secondary | ICD-10-CM | POA: Diagnosis not present

## 2017-02-09 DIAGNOSIS — E119 Type 2 diabetes mellitus without complications: Secondary | ICD-10-CM | POA: Diagnosis not present

## 2017-02-09 DIAGNOSIS — J449 Chronic obstructive pulmonary disease, unspecified: Secondary | ICD-10-CM | POA: Diagnosis not present

## 2017-02-09 DIAGNOSIS — Z7901 Long term (current) use of anticoagulants: Secondary | ICD-10-CM | POA: Diagnosis not present

## 2017-02-09 NOTE — Telephone Encounter (Signed)
[facility]-Karen called concerning patient comadium check. She states she has seen no bleeding but daughter is wanting this check and karen stated they can do this but needs the ok from you. She wanted to know her next appointment which is not until 7/25 for follow up here.[phone]

## 2017-02-09 NOTE — Telephone Encounter (Signed)
May have an order for INR, we prefer that this result be called to us so that we can make sure that we respond to the result.

## 2017-02-09 NOTE — Telephone Encounter (Signed)
Spoke with Clydie [facility]aren with [facility] and gave her verbal orders to check patient's INR per Dr.Scott Luking. Advised her to call us with results so that we may direct coumadin therapy. Clydie BraunKaren verbalized understanding.

## 2017-02-13 DIAGNOSIS — Z7984 Long term (current) use of oral hypoglycemic drugs: Secondary | ICD-10-CM | POA: Diagnosis not present

## 2017-02-13 DIAGNOSIS — J449 Chronic obstructive pulmonary disease, unspecified: Secondary | ICD-10-CM | POA: Diagnosis not present

## 2017-02-13 DIAGNOSIS — Z7901 Long term (current) use of anticoagulants: Secondary | ICD-10-CM | POA: Diagnosis not present

## 2017-02-13 DIAGNOSIS — E119 Type 2 diabetes mellitus without complications: Secondary | ICD-10-CM | POA: Diagnosis not present

## 2017-02-13 DIAGNOSIS — M5136 Other intervertebral disc degeneration, lumbar region: Secondary | ICD-10-CM | POA: Diagnosis not present

## 2017-02-13 DIAGNOSIS — I10 Essential (primary) hypertension: Secondary | ICD-10-CM | POA: Diagnosis not present

## 2017-02-13 DIAGNOSIS — R531 Weakness: Secondary | ICD-10-CM | POA: Diagnosis not present

## 2017-02-13 DIAGNOSIS — Z86711 Personal history of pulmonary embolism: Secondary | ICD-10-CM | POA: Diagnosis not present

## 2017-02-13 DIAGNOSIS — Z87440 Personal history of urinary (tract) infections: Secondary | ICD-10-CM | POA: Diagnosis not present

## 2017-02-14 DIAGNOSIS — R531 Weakness: Secondary | ICD-10-CM | POA: Diagnosis not present

## 2017-02-14 DIAGNOSIS — I10 Essential (primary) hypertension: Secondary | ICD-10-CM | POA: Diagnosis not present

## 2017-02-14 DIAGNOSIS — E119 Type 2 diabetes mellitus without complications: Secondary | ICD-10-CM | POA: Diagnosis not present

## 2017-02-14 DIAGNOSIS — Z7901 Long term (current) use of anticoagulants: Secondary | ICD-10-CM | POA: Diagnosis not present

## 2017-02-14 DIAGNOSIS — J449 Chronic obstructive pulmonary disease, unspecified: Secondary | ICD-10-CM | POA: Diagnosis not present

## 2017-02-14 DIAGNOSIS — Z7984 Long term (current) use of oral hypoglycemic drugs: Secondary | ICD-10-CM | POA: Diagnosis not present

## 2017-02-14 DIAGNOSIS — M5136 Other intervertebral disc degeneration, lumbar region: Secondary | ICD-10-CM | POA: Diagnosis not present

## 2017-02-14 DIAGNOSIS — Z86711 Personal history of pulmonary embolism: Secondary | ICD-10-CM | POA: Diagnosis not present

## 2017-02-14 DIAGNOSIS — Z87440 Personal history of urinary (tract) infections: Secondary | ICD-10-CM | POA: Diagnosis not present

## 2017-02-15 DIAGNOSIS — Z87440 Personal history of urinary (tract) infections: Secondary | ICD-10-CM | POA: Diagnosis not present

## 2017-02-15 DIAGNOSIS — J449 Chronic obstructive pulmonary disease, unspecified: Secondary | ICD-10-CM | POA: Diagnosis not present

## 2017-02-15 DIAGNOSIS — Z86711 Personal history of pulmonary embolism: Secondary | ICD-10-CM | POA: Diagnosis not present

## 2017-02-15 DIAGNOSIS — I10 Essential (primary) hypertension: Secondary | ICD-10-CM | POA: Diagnosis not present

## 2017-02-15 DIAGNOSIS — M5136 Other intervertebral disc degeneration, lumbar region: Secondary | ICD-10-CM | POA: Diagnosis not present

## 2017-02-15 DIAGNOSIS — Z7984 Long term (current) use of oral hypoglycemic drugs: Secondary | ICD-10-CM | POA: Diagnosis not present

## 2017-02-15 DIAGNOSIS — Z7901 Long term (current) use of anticoagulants: Secondary | ICD-10-CM | POA: Diagnosis not present

## 2017-02-15 DIAGNOSIS — E119 Type 2 diabetes mellitus without complications: Secondary | ICD-10-CM | POA: Diagnosis not present

## 2017-02-15 DIAGNOSIS — R531 Weakness: Secondary | ICD-10-CM | POA: Diagnosis not present

## 2017-02-16 DIAGNOSIS — R531 Weakness: Secondary | ICD-10-CM | POA: Diagnosis not present

## 2017-02-16 DIAGNOSIS — Z86711 Personal history of pulmonary embolism: Secondary | ICD-10-CM | POA: Diagnosis not present

## 2017-02-16 DIAGNOSIS — Z87440 Personal history of urinary (tract) infections: Secondary | ICD-10-CM | POA: Diagnosis not present

## 2017-02-16 DIAGNOSIS — J449 Chronic obstructive pulmonary disease, unspecified: Secondary | ICD-10-CM | POA: Diagnosis not present

## 2017-02-16 DIAGNOSIS — M5136 Other intervertebral disc degeneration, lumbar region: Secondary | ICD-10-CM | POA: Diagnosis not present

## 2017-02-16 DIAGNOSIS — E119 Type 2 diabetes mellitus without complications: Secondary | ICD-10-CM | POA: Diagnosis not present

## 2017-02-16 DIAGNOSIS — I10 Essential (primary) hypertension: Secondary | ICD-10-CM | POA: Diagnosis not present

## 2017-02-16 DIAGNOSIS — Z7901 Long term (current) use of anticoagulants: Secondary | ICD-10-CM | POA: Diagnosis not present

## 2017-02-16 DIAGNOSIS — Z7984 Long term (current) use of oral hypoglycemic drugs: Secondary | ICD-10-CM | POA: Diagnosis not present

## 2017-02-17 DIAGNOSIS — Z87440 Personal history of urinary (tract) infections: Secondary | ICD-10-CM | POA: Diagnosis not present

## 2017-02-17 DIAGNOSIS — E119 Type 2 diabetes mellitus without complications: Secondary | ICD-10-CM | POA: Diagnosis not present

## 2017-02-17 DIAGNOSIS — R531 Weakness: Secondary | ICD-10-CM | POA: Diagnosis not present

## 2017-02-17 DIAGNOSIS — I10 Essential (primary) hypertension: Secondary | ICD-10-CM | POA: Diagnosis not present

## 2017-02-17 DIAGNOSIS — J449 Chronic obstructive pulmonary disease, unspecified: Secondary | ICD-10-CM | POA: Diagnosis not present

## 2017-02-17 DIAGNOSIS — Z86711 Personal history of pulmonary embolism: Secondary | ICD-10-CM | POA: Diagnosis not present

## 2017-02-17 DIAGNOSIS — Z9181 History of falling: Secondary | ICD-10-CM | POA: Diagnosis not present

## 2017-02-20 DIAGNOSIS — M5136 Other intervertebral disc degeneration, lumbar region: Secondary | ICD-10-CM | POA: Diagnosis not present

## 2017-02-20 DIAGNOSIS — R531 Weakness: Secondary | ICD-10-CM | POA: Diagnosis not present

## 2017-02-20 DIAGNOSIS — Z7901 Long term (current) use of anticoagulants: Secondary | ICD-10-CM | POA: Diagnosis not present

## 2017-02-20 DIAGNOSIS — Z86711 Personal history of pulmonary embolism: Secondary | ICD-10-CM | POA: Diagnosis not present

## 2017-02-20 DIAGNOSIS — J449 Chronic obstructive pulmonary disease, unspecified: Secondary | ICD-10-CM | POA: Diagnosis not present

## 2017-02-20 DIAGNOSIS — Z7984 Long term (current) use of oral hypoglycemic drugs: Secondary | ICD-10-CM | POA: Diagnosis not present

## 2017-02-20 DIAGNOSIS — E119 Type 2 diabetes mellitus without complications: Secondary | ICD-10-CM | POA: Diagnosis not present

## 2017-02-20 DIAGNOSIS — I10 Essential (primary) hypertension: Secondary | ICD-10-CM | POA: Diagnosis not present

## 2017-02-20 DIAGNOSIS — Z87440 Personal history of urinary (tract) infections: Secondary | ICD-10-CM | POA: Diagnosis not present

## 2017-02-21 DIAGNOSIS — Z7984 Long term (current) use of oral hypoglycemic drugs: Secondary | ICD-10-CM | POA: Diagnosis not present

## 2017-02-21 DIAGNOSIS — Z86711 Personal history of pulmonary embolism: Secondary | ICD-10-CM | POA: Diagnosis not present

## 2017-02-21 DIAGNOSIS — Z7901 Long term (current) use of anticoagulants: Secondary | ICD-10-CM | POA: Diagnosis not present

## 2017-02-21 DIAGNOSIS — E119 Type 2 diabetes mellitus without complications: Secondary | ICD-10-CM | POA: Diagnosis not present

## 2017-02-21 DIAGNOSIS — Z87440 Personal history of urinary (tract) infections: Secondary | ICD-10-CM | POA: Diagnosis not present

## 2017-02-21 DIAGNOSIS — R531 Weakness: Secondary | ICD-10-CM | POA: Diagnosis not present

## 2017-02-21 DIAGNOSIS — J449 Chronic obstructive pulmonary disease, unspecified: Secondary | ICD-10-CM | POA: Diagnosis not present

## 2017-02-21 DIAGNOSIS — M5136 Other intervertebral disc degeneration, lumbar region: Secondary | ICD-10-CM | POA: Diagnosis not present

## 2017-02-21 DIAGNOSIS — I10 Essential (primary) hypertension: Secondary | ICD-10-CM | POA: Diagnosis not present

## 2017-02-23 DIAGNOSIS — E119 Type 2 diabetes mellitus without complications: Secondary | ICD-10-CM | POA: Diagnosis not present

## 2017-02-23 DIAGNOSIS — Z87440 Personal history of urinary (tract) infections: Secondary | ICD-10-CM | POA: Diagnosis not present

## 2017-02-23 DIAGNOSIS — Z7984 Long term (current) use of oral hypoglycemic drugs: Secondary | ICD-10-CM | POA: Diagnosis not present

## 2017-02-23 DIAGNOSIS — M5136 Other intervertebral disc degeneration, lumbar region: Secondary | ICD-10-CM | POA: Diagnosis not present

## 2017-02-23 DIAGNOSIS — Z7901 Long term (current) use of anticoagulants: Secondary | ICD-10-CM | POA: Diagnosis not present

## 2017-02-23 DIAGNOSIS — J449 Chronic obstructive pulmonary disease, unspecified: Secondary | ICD-10-CM | POA: Diagnosis not present

## 2017-02-23 DIAGNOSIS — I10 Essential (primary) hypertension: Secondary | ICD-10-CM | POA: Diagnosis not present

## 2017-02-23 DIAGNOSIS — Z86711 Personal history of pulmonary embolism: Secondary | ICD-10-CM | POA: Diagnosis not present

## 2017-02-23 DIAGNOSIS — R531 Weakness: Secondary | ICD-10-CM | POA: Diagnosis not present

## 2017-02-24 ENCOUNTER — Telehealth: Payer: Self-pay | Admitting: Family Medicine

## 2017-02-24 DIAGNOSIS — I82409 Acute embolism and thrombosis of unspecified deep veins of unspecified lower extremity: Secondary | ICD-10-CM | POA: Diagnosis not present

## 2017-02-24 DIAGNOSIS — L89152 Pressure ulcer of sacral region, stage 2: Secondary | ICD-10-CM | POA: Diagnosis not present

## 2017-02-24 DIAGNOSIS — M6281 Muscle weakness (generalized): Secondary | ICD-10-CM | POA: Diagnosis not present

## 2017-02-24 DIAGNOSIS — J449 Chronic obstructive pulmonary disease, unspecified: Secondary | ICD-10-CM | POA: Diagnosis not present

## 2017-02-24 NOTE — Telephone Encounter (Signed)
FYI-[facility] nurse wanted you to know releasing patient on 7/11 doing much better.Able to move around more and feeling better.

## 2017-02-24 NOTE — Telephone Encounter (Signed)
So noted 

## 2017-03-03 DIAGNOSIS — M5136 Other intervertebral disc degeneration, lumbar region: Secondary | ICD-10-CM | POA: Diagnosis not present

## 2017-03-03 DIAGNOSIS — J449 Chronic obstructive pulmonary disease, unspecified: Secondary | ICD-10-CM | POA: Diagnosis not present

## 2017-03-03 DIAGNOSIS — Z7984 Long term (current) use of oral hypoglycemic drugs: Secondary | ICD-10-CM | POA: Diagnosis not present

## 2017-03-03 DIAGNOSIS — I10 Essential (primary) hypertension: Secondary | ICD-10-CM | POA: Diagnosis not present

## 2017-03-03 DIAGNOSIS — Z7901 Long term (current) use of anticoagulants: Secondary | ICD-10-CM | POA: Diagnosis not present

## 2017-03-03 DIAGNOSIS — Z87440 Personal history of urinary (tract) infections: Secondary | ICD-10-CM | POA: Diagnosis not present

## 2017-03-03 DIAGNOSIS — E119 Type 2 diabetes mellitus without complications: Secondary | ICD-10-CM | POA: Diagnosis not present

## 2017-03-03 DIAGNOSIS — Z86711 Personal history of pulmonary embolism: Secondary | ICD-10-CM | POA: Diagnosis not present

## 2017-03-03 DIAGNOSIS — R531 Weakness: Secondary | ICD-10-CM | POA: Diagnosis not present

## 2017-03-06 ENCOUNTER — Other Ambulatory Visit: Payer: Self-pay | Admitting: Family Medicine

## 2017-03-06 NOTE — Telephone Encounter (Signed)
Please call the pharmacy let them know that this medication glipizide is no longer being used for this patient so they take it off their list so they do not send this further requests. May have 5 refills on metoprolol patient has follow-up office visit in the July please call patient family make sure they bring all of her medicines with her.

## 2017-03-07 NOTE — Telephone Encounter (Signed)
Please have family bring all of her medications with her when she comes on the 25th thank you

## 2017-03-08 NOTE — Telephone Encounter (Signed)
Daughter advised to please bring all of the patient's medication to follow up visit.

## 2017-03-15 ENCOUNTER — Ambulatory Visit (INDEPENDENT_AMBULATORY_CARE_PROVIDER_SITE_OTHER): Payer: Medicare HMO | Admitting: Family Medicine

## 2017-03-15 ENCOUNTER — Encounter: Payer: Self-pay | Admitting: Family Medicine

## 2017-03-15 VITALS — BP 112/80 | Ht 65.0 in | Wt 140.0 lb

## 2017-03-15 DIAGNOSIS — E118 Type 2 diabetes mellitus with unspecified complications: Secondary | ICD-10-CM | POA: Diagnosis not present

## 2017-03-15 DIAGNOSIS — I824Y9 Acute embolism and thrombosis of unspecified deep veins of unspecified proximal lower extremity: Secondary | ICD-10-CM | POA: Diagnosis not present

## 2017-03-15 DIAGNOSIS — I10 Essential (primary) hypertension: Secondary | ICD-10-CM | POA: Diagnosis not present

## 2017-03-15 DIAGNOSIS — Z7901 Long term (current) use of anticoagulants: Secondary | ICD-10-CM | POA: Diagnosis not present

## 2017-03-15 LAB — POCT INR: INR: 2.8

## 2017-03-15 LAB — POCT GLYCOSYLATED HEMOGLOBIN (HGB A1C): HEMOGLOBIN A1C: 5.4

## 2017-03-15 MED ORDER — LISINOPRIL 20 MG PO TABS: 20.0000 mg | ORAL_TABLET | Freq: Every day | ORAL | 1 refills | Status: DC

## 2017-03-15 NOTE — Progress Notes (Addendum)
   Subjective:    Patient ID: Natalie Rogers, female    DOB: 1924/05/28, 83 y.o.   MRN: [id number]  HPI: Patient is here today with her daughter Natalie Rogers as a follow up from last office visit here on May 31,2018. Significant issues regarding the dementia but no hallucinations recently. Sleeping better No chest tightness pressure pain shortness breath No significant swelling in the legs No bleeding issues Patient has had some falls including one where she struck her head. She does have history of having a DVT in the leg as well as pulmonary embolism in the past but this was approximately 20 years ago. According to the daughter she is been on Coumadin ever since. No other concerns Results for orders placed or performed in visit on 03/15/17  POCT INR  Result Value Ref Range   INR 2.8   POCT glycosylated hemoglobin (Hb A1C)  Result Value Ref Range   Hemoglobin A1C 5.4      Review of Systems Please see above no fevers cough wheezing no bleeding issues mild ataxia moderate weakness walks with a walker    Objective:   Physical Exam Lungs are clear hearts regular pulse normal extremities no edema skin warm dry calf nontender  Mild dementia but patient able to interrelate okay Blood pressure 120/76 sitting 110/66 standing    Assessment & Plan:  History a DVT and history pulmonary embolism although 20 years ago Patient has been on Coumadin for years recently the patient came to our practice  I believe it is reasonable for this patient to come off of Coumadin. She is getting progressively weaker with history of falls and ataxia. This puts her at greater risk of a accidental bleed resulting from a fall. We will have her see hematology to see if any testing necessary to screen for possibility of DVT reoccurrence/hypercoagulability. After we get consultation from hematology for the likely hopefully be able to stop Coumadin.  Dementia stable continue current medication  A1c looks good continue  healthy eating  Orthostatic hypotension decrease lisinopril to 20 mg

## 2017-03-16 ENCOUNTER — Encounter: Payer: Self-pay | Admitting: Family Medicine

## 2017-03-16 LAB — BASIC METABOLIC PANEL
BUN / CREAT RATIO: 7 — AB (ref 12–28)
BUN: 5 mg/dL — AB (ref 10–36)
CHLORIDE: 99 mmol/L (ref 96–106)
CO2: 20 mmol/L (ref 20–29)
Calcium: 9.1 mg/dL (ref 8.7–10.3)
Creatinine, Ser: 0.68 mg/dL (ref 0.57–1.00)
GFR calc Af Amer: 89 mL/min/{1.73_m2} (ref >59)
GFR calc non Af Amer: 77 mL/min/{1.73_m2} (ref >59)
GLUCOSE: 123 mg/dL — AB (ref 65–99)
Potassium: 4.7 mmol/L (ref 3.5–5.2)
Sodium: 142 mmol/L (ref 134–144)

## 2017-03-21 ENCOUNTER — Ambulatory Visit (HOSPITAL_COMMUNITY)
Admission: RE | Admit: 2017-03-21 | Discharge: 2017-03-21 | Disposition: A | Discharge status: Home / Self Care | Payer: Medicare HMO | Source: Ambulatory Visit | Attending: Family Medicine | Admitting: Family Medicine

## 2017-03-21 ENCOUNTER — Encounter: Payer: Self-pay | Admitting: Family Medicine

## 2017-03-21 ENCOUNTER — Ambulatory Visit (INDEPENDENT_AMBULATORY_CARE_PROVIDER_SITE_OTHER): Payer: Medicare HMO | Admitting: Family Medicine

## 2017-03-21 VITALS — BP 132/80 | Ht 65.0 in | Wt 140.0 lb

## 2017-03-21 DIAGNOSIS — I10 Essential (primary) hypertension: Secondary | ICD-10-CM | POA: Diagnosis not present

## 2017-03-21 DIAGNOSIS — X58XXXA Exposure to other specified factors, initial encounter: Secondary | ICD-10-CM | POA: Diagnosis not present

## 2017-03-21 DIAGNOSIS — S20219A Contusion of unspecified front wall of thorax, initial encounter: Secondary | ICD-10-CM | POA: Diagnosis not present

## 2017-03-21 DIAGNOSIS — R937 Abnormal findings on diagnostic imaging of other parts of musculoskeletal system: Secondary | ICD-10-CM | POA: Diagnosis not present

## 2017-03-21 DIAGNOSIS — M546 Pain in thoracic spine: Secondary | ICD-10-CM | POA: Diagnosis not present

## 2017-03-21 DIAGNOSIS — S2020XA Contusion of thorax, unspecified, initial encounter: Secondary | ICD-10-CM | POA: Diagnosis not present

## 2017-03-21 MED ORDER — LISINOPRIL 10 MG PO TABS: 10.0000 mg | ORAL_TABLET | Freq: Every day | ORAL | 5 refills | Status: AC

## 2017-03-21 NOTE — Progress Notes (Signed)
   Subjective:    Patient ID: Natalie Rogers, female    DOB: Jan 03, 1924, 83 y.o.   MRN: [id number]  HPIFell 2 days ago. Having pain in mid back, right shoulder, and right hip. Hurts in chest when blowing nose. Hurts to take a deep breath.   patient was trying to sit on the toilet fell backwards striking her ribs and back for head against the [facility] of clean diapers as well as a cabinet door no loss of consciousness no wheezing or difficulty breathing just soreness   Review of Systems     Please see above on Coumadin Objective:   Physical Exam  lungs clear hearts regular pulsenormal extremities no edema skin warm dry neurologic gross normal mild thoacic tenderness chest wall mild tenderness on the right side   issue be noted that the patient's blood pressure does drop when she stands it was 130/72 sitting and when she stood up it was 116/ 60 this puts her at risk of further falls I recommend reducing her lisinopril to 10 mg -she has follow-up later this fall     Assessment & Plan:   x-rays ordered await the results  avoid falling Will be seeing hematology for further evaluation regarding blood clots We will try to connect with hematology to se if they recommend any previsit lab work.

## 2017-03-27 DIAGNOSIS — L89152 Pressure ulcer of sacral region, stage 2: Secondary | ICD-10-CM | POA: Diagnosis not present

## 2017-03-27 DIAGNOSIS — I82409 Acute embolism and thrombosis of unspecified deep veins of unspecified lower extremity: Secondary | ICD-10-CM | POA: Diagnosis not present

## 2017-03-27 DIAGNOSIS — J449 Chronic obstructive pulmonary disease, unspecified: Secondary | ICD-10-CM | POA: Diagnosis not present

## 2017-03-27 DIAGNOSIS — M6281 Muscle weakness (generalized): Secondary | ICD-10-CM | POA: Diagnosis not present

## 2017-04-01 ENCOUNTER — Other Ambulatory Visit: Payer: Self-pay | Admitting: Family Medicine

## 2017-04-03 ENCOUNTER — Telehealth: Payer: Self-pay | Admitting: Family Medicine

## 2017-04-03 DIAGNOSIS — R300 Dysuria: Secondary | ICD-10-CM

## 2017-04-03 NOTE — Telephone Encounter (Signed)
Misty StanleyLisa thinks that Nicole CellaDorothy may have another UTI.  She is acting like she is weak and hallucinating a bit, also possible pinkish color in disposable panties a couple of days last week.  She said that she doesn't have a way to bring Denelle to the office at this time because of transportation issues.  She wants to know if one of the nurses from [facility] can bring a specimen in to have her urine checked.

## 2017-04-03 NOTE — Telephone Encounter (Signed)
Left message return call 04/03/17 

## 2017-04-03 NOTE — Telephone Encounter (Signed)
(  I assume this is [facility] home [facility]? If so they can do a urinalysis and urine culture.) Therefore please order urinalysis and urine culture-it would be important for them to let us know the results of this test-if for another reason it works out better for them to bring a urine specimen directly here that would be fine

## 2017-04-04 NOTE — Telephone Encounter (Signed)
May refill 6 

## 2017-04-04 NOTE — Telephone Encounter (Signed)
Left message to return call 

## 2017-04-04 NOTE — Telephone Encounter (Signed)
Patient no longer has home health coming to the house and the daughter currently does not have a car- states her mom is doing better and if she gets worse she will have to find a way to get her to the office.

## 2017-04-04 NOTE — Telephone Encounter (Signed)
Prescription sent electronically to pharmacy. 

## 2017-04-14 ENCOUNTER — Ambulatory Visit (INDEPENDENT_AMBULATORY_CARE_PROVIDER_SITE_OTHER): Payer: Medicare HMO | Admitting: Family Medicine

## 2017-04-14 ENCOUNTER — Encounter: Payer: Self-pay | Admitting: Family Medicine

## 2017-04-14 VITALS — BP 110/70 | Ht 65.0 in | Wt 134.0 lb

## 2017-04-14 DIAGNOSIS — M2550 Pain in unspecified joint: Secondary | ICD-10-CM | POA: Diagnosis not present

## 2017-04-14 DIAGNOSIS — R109 Unspecified abdominal pain: Secondary | ICD-10-CM

## 2017-04-14 DIAGNOSIS — F0390 Unspecified dementia without behavioral disturbance: Secondary | ICD-10-CM

## 2017-04-14 DIAGNOSIS — Z7901 Long term (current) use of anticoagulants: Secondary | ICD-10-CM | POA: Diagnosis not present

## 2017-04-14 DIAGNOSIS — R300 Dysuria: Secondary | ICD-10-CM | POA: Diagnosis not present

## 2017-04-14 DIAGNOSIS — G301 Alzheimer's disease with late onset: Secondary | ICD-10-CM

## 2017-04-14 DIAGNOSIS — R634 Abnormal weight loss: Secondary | ICD-10-CM

## 2017-04-14 DIAGNOSIS — F0281 Dementia in other diseases classified elsewhere with behavioral disturbance: Secondary | ICD-10-CM

## 2017-04-14 DIAGNOSIS — F02818 Dementia in other diseases classified elsewhere, unspecified severity, with other behavioral disturbance: Secondary | ICD-10-CM

## 2017-04-14 HISTORY — DX: Unspecified dementia, unspecified severity, without behavioral disturbance, psychotic disturbance, mood disturbance, and anxiety: F03.90

## 2017-04-14 LAB — POCT URINALYSIS DIPSTICK
PH UA: 5 (ref 5.0–8.0)
Spec Grav, UA: >=1.03 — AB (ref 1.010–1.025)

## 2017-04-14 LAB — POCT INR: INR: 1.9

## 2017-04-14 MED ORDER — TRAMADOL HCL 50 MG PO TABS: ORAL_TABLET | ORAL | 0 refills | Status: DC

## 2017-04-14 NOTE — Progress Notes (Signed)
Subjective:    Patient ID: Natalie Rogers, female    DOB: [birth year], 83 y.o.   MRN: [id number]  HPI Patient is being brought in by her daughter today for a Pt/Inr check and possible uti. The daughter states her mothers urine has a foul smell to it and she saw a tinge of blood when she wiped her the other day. She is also on coumadin 5 mg 1/2 tablet all day but Sat,and Sun. She had her inr checked today and it was 1.9.Review of Systems  Constitutional: Negative for activity change and appetite change.  HENT: Negative for congestion.   Respiratory: Negative for cough.   Cardiovascular: Negative for chest pain.  Gastrointestinal: Negative for abdominal pain and vomiting.  Skin: Negative for color change.  Neurological: Negative for weakness.  Psychiatric/Behavioral: Negative for confusion.   Patient has multiple issues Urinary symptoms been going on with followed her over the past week denies dysuria urinary frequency just a foul odor denies high fever chills sweats  Patient with arthralgia pains and discomfort uses Tylenol would like some stronger family a little bit cautious because of patient has had some hallucination issues  Patient has severe dementia is had it for years progressively worse does not eat as much not as active. Does not one to do is much. Some intermittent hallucinations and some difficulty sleeping at night taking Aricept on a regular basis  Patient not eating as well as normal patient also having intermittent abdominal discomforts denies rectal bleeding. Has steadily been losing weight over the past 6 months for no apparent reason other than not eating as much and intermittent abdominal pain  Previous lab work overall looks good    Results for orders placed or performed in visit on 04/14/17  POCT INR  Result Value Ref Range   INR 1.9   POCT urinalysis dipstick  Result Value Ref Range   Color, UA     Clarity, UA     Glucose, UA     Bilirubin, UA     Ketones,  UA     Spec Grav, UA >=1.030 (A) 1.010 - 1.025   Blood, UA     pH, UA 5.0 5.0 - 8.0   Protein, UA     Urobilinogen, UA  0.2 or 1.0 E.U./dL   Nitrite, UA     Leukocytes, UA Trace (A) Negative    Objective:   Physical Exam  Constitutional: She appears well-developed and well-nourished. No distress.  HENT:  Head: Normocephalic and atraumatic.  Eyes: Right eye exhibits no discharge. Left eye exhibits no discharge.  Neck: No tracheal deviation present.  Cardiovascular: Normal rate, regular rhythm and normal heart sounds.   No murmur heard. Pulmonary/Chest: Effort normal and breath sounds normal. No respiratory distress. She has no wheezes. She has no rales.  Musculoskeletal: She exhibits no edema.  Lymphadenopathy:    She has no cervical adenopathy.  Neurological: She is alert. She exhibits normal muscle tone.  Skin: Skin is warm and dry. No erythema.  Psychiatric: Her behavior is normal.  Vitals reviewed.  Abdomen is soft there is no guarding rebound or tenderness       Assessment & Plan:  Patient on anticoagulants. Leave as is. Will be seen hematology in the near future. Has remote history of frequent DVT. Patient start to become more ataxic we difficult time getting around I am concerned that she is eventually going to fall and have a significant subdural bleed. I believe she would benefit  from hematology evaluation and possible removal of anticoagulants  Foul odor to the urine no obvious infection under the microscope certainly bacteria can occur with elderly. We will go ahead and check a urine culture  Nonspecific arthralgias tramadol maybe use as long as it does not cause drowsiness family will let us know if any side effects or problems  Weight loss with abdominal discomfort-the abdominal discomfort is intermittent but wait lost it is impressive over the past several months I believe this patient would benefit from a CT of the abdomen and pelvis await the results of this to  rule out the possibility of cancer  Alzheimer's continue current medication. I believe the patient is having gradual decline with her cognitive skills motor skills and appetite.  25 minutes was spent with the patient. Greater than half the time was spent in discussion and answering questions and counseling regarding the issues that the patient came in for today.

## 2017-04-17 LAB — URINE CULTURE

## 2017-04-18 MED ORDER — NITROFURANTOIN MONOHYD MACRO 100 MG PO CAPS: 100.0000 mg | ORAL_CAPSULE | Freq: Two times a day (BID) | ORAL | 0 refills | Status: DC

## 2017-04-18 NOTE — Addendum Note (Signed)
Addended by: Margaretha Sheffield on: 04/18/2017 03:31 PM   Modules accepted: Orders

## 2017-04-21 ENCOUNTER — Ambulatory Visit (HOSPITAL_COMMUNITY)
Admission: RE | Admit: 2017-04-21 | Discharge: 2017-04-21 | Disposition: A | Discharge status: Home / Self Care | Payer: Medicare HMO | Source: Ambulatory Visit | Attending: Family Medicine | Admitting: Family Medicine

## 2017-04-21 ENCOUNTER — Encounter (HOSPITAL_COMMUNITY): Payer: Self-pay

## 2017-04-21 DIAGNOSIS — K838 Other specified diseases of biliary tract: Secondary | ICD-10-CM | POA: Insufficient documentation

## 2017-04-21 DIAGNOSIS — R109 Unspecified abdominal pain: Secondary | ICD-10-CM | POA: Diagnosis present

## 2017-04-21 DIAGNOSIS — K8689 Other specified diseases of pancreas: Secondary | ICD-10-CM | POA: Diagnosis not present

## 2017-04-21 DIAGNOSIS — R634 Abnormal weight loss: Secondary | ICD-10-CM | POA: Diagnosis present

## 2017-04-21 DIAGNOSIS — K5730 Diverticulosis of large intestine without perforation or abscess without bleeding: Secondary | ICD-10-CM | POA: Diagnosis not present

## 2017-04-21 MED ORDER — IOPAMIDOL (ISOVUE-300) INJECTION 61%
100.0000 mL | Freq: Once | INTRAVENOUS | Status: AC | PRN
  Administered 2017-04-21: 100 mL via INTRAVENOUS

## 2017-04-27 DIAGNOSIS — J449 Chronic obstructive pulmonary disease, unspecified: Secondary | ICD-10-CM | POA: Diagnosis not present

## 2017-04-27 DIAGNOSIS — M6281 Muscle weakness (generalized): Secondary | ICD-10-CM | POA: Diagnosis not present

## 2017-04-27 DIAGNOSIS — I82409 Acute embolism and thrombosis of unspecified deep veins of unspecified lower extremity: Secondary | ICD-10-CM | POA: Diagnosis not present

## 2017-04-27 DIAGNOSIS — L89152 Pressure ulcer of sacral region, stage 2: Secondary | ICD-10-CM | POA: Diagnosis not present

## 2017-05-02 ENCOUNTER — Encounter (HOSPITAL_COMMUNITY): Payer: Medicare HMO | Attending: Oncology | Admitting: Oncology

## 2017-05-02 ENCOUNTER — Encounter (HOSPITAL_COMMUNITY): Payer: Self-pay

## 2017-05-02 DIAGNOSIS — Z7901 Long term (current) use of anticoagulants: Secondary | ICD-10-CM | POA: Diagnosis not present

## 2017-05-02 DIAGNOSIS — Z86718 Personal history of other venous thrombosis and embolism: Secondary | ICD-10-CM

## 2017-05-02 NOTE — Progress Notes (Signed)
[facility] Initial Visit:  Patient Care Team: Babs SciaraLuking, Scott A, MD as PCP - General (Family Medicine)  CHIEF COMPLAINTS/PURPOSE OF CONSULTATION:  Anticoagulation recommendations for history of remote PE/DVT.  HISTORY OF PRESENTING ILLNESS: Natalie Rogers 83 y.o. female is here because of anticoagulation recommendations for history of remote PE/DVT. Patient is accompanied by her daughter. Patient had a history of a DVT after childbirth more than 70 years ago. She then had another blood clot in her lungs, this one sounds unprovoked, about 25-30 years ago. She has been on warfarin for at least 25 years. More recently patient has been having frequent falls. She fell towards the end of August and again yesterday. Dr. Gerda DissLuking, her PCP, is concerned about potential bleeding complications of the patient being on warfarin with her frequent fall. Patient bruises easily. She denies any chest pain, shortness of breath, abdominal pain, lower extremity edema, headache, focal weakness. Patient lives with her daughter. Her daughter states that the patient is pretty sendentary and only gets around at home and does not ambulate much. She uses a walker at home.  Review of Systems - Oncology ROS as per HPI otherwise 12 point ROS is negative.  MEDICAL HISTORY: Past Medical History:  Diagnosis Date  . COPD (chronic obstructive pulmonary disease) (HCC)   . Dementia 04/14/2017  . DVT (deep venous thrombosis) (HCC)   . GERD (gastroesophageal reflux disease)   . HLD (hyperlipidemia)   . HTN (hypertension)   . PE (pulmonary embolism)    on chronic anticoagulation  . Type 2 diabetes mellitus (HCC)     SURGICAL HISTORY: Past Surgical History:  Procedure Laterality Date  . ABDOMINAL HYSTERECTOMY    . CHOLECYSTECTOMY      SOCIAL HISTORY: Social History   Social History  . Marital status: Divorced    Spouse name: N/A  . Number of children: N/A  . Years of education: N/A    Occupational History  . Not on file.   Social History Main Topics  . Smoking status: Former Games developermoker  . Smokeless tobacco: Never Used  . Alcohol use No  . Drug use: No  . Sexual activity: Not on file   Other Topics Concern  . Not on file   Social History Narrative  . No narrative on file    FAMILY HISTORY Family History  Problem Relation Age of Onset  . Heart disease Unknown   . Alzheimer's disease Unknown   . Stroke Unknown   . Breast cancer Unknown     ALLERGIES:  is allergic to penicillins; codeine; and demerol [meperidine].  MEDICATIONS:  Current Outpatient Prescriptions  Medication Sig Dispense Refill  . acetaminophen (TYLENOL) 500 MG tablet Take 500 mg by mouth every 6 (six) hours as needed for mild pain or headache.    . donepezil (ARICEPT) 10 MG tablet TAKE ONE TABLET BY MOUTH ONCE DAILY IN THE EVENING 90 tablet 1  . ipratropium (ATROVENT) 0.03 % nasal spray Place 2 sprays into both nostrils every 12 (twelve) hours. 30 mL 12  . lactose free nutrition (BOOST) LIQD Take 237 mLs by mouth as needed.    . lidocaine (LIDODERM) 5 % Place 1 patch onto the skin daily. Remove & Discard patch within 12 hours or as directed by MD 30 patch 0  . lisinopril (PRINIVIL,ZESTRIL) 10 MG tablet Take 1 tablet (10 mg total) by mouth daily. 30 tablet 5  . Melatonin 10 MG TABS Take by mouth. At bedtime    .  metFORMIN (GLUCOPHAGE) 500 MG tablet TAKE ONE TABLET P TWICE DAILY 180 tablet 0  . metoprolol succinate (TOPROL-XL) 25 MG 24 hr tablet TAKE ONE TABLET BY MOUTH DAILY 90 tablet 1  . nitrofurantoin, macrocrystal-monohydrate, (MACROBID) 100 MG capsule Take 1 capsule (100 mg total) by mouth 2 (two) times daily. 10 capsule 0  . pantoprazole (PROTONIX) 40 MG tablet TAKE ONE TABLET BY MOUTH EVERY DAY 90 tablet 0  . traMADol (ULTRAM) 50 MG tablet One qd prn pain 20 tablet 0  . traZODone (DESYREL) 50 MG tablet Take 1 tablet (50 mg total) by mouth at bedtime as needed for sleep. 30 tablet 12  .  warfarin (COUMADIN) 5 MG tablet Take 5 mg by mouth See admin instructions.  on Sat and Sun. 2.5mg  Mon-Fri.  1   No current facility-administered medications for this visit.     PHYSICAL EXAMINATION:  Vitals:   05/02/17 1345  BP: (!) 166/62  Pulse: 63  Resp: 20  Temp: 98.4 F (36.9 C)  SpO2: 96%    Filed Weights   05/02/17 1345  Weight: 131 lb 3.2 oz (59.5 kg)     Physical Exam Constitutional: Elderly female, well-developed, well-nourished, and in no distress.   HENT:  Head: Normocephalic and atraumatic.  Mouth/Throat: No oropharyngeal exudate. Mucosa moist. Eyes: Pupils are equal, round, and reactive to light. Conjunctivae are normal. No scleral icterus.  Neck: Normal range of motion. Neck supple. No JVD present.  Cardiovascular: Normal rate, regular rhythm and normal heart sounds.  Exam reveals no gallop and no friction rub.   No murmur heard. Pulmonary/Chest: Effort normal and breath sounds normal. No respiratory distress. No wheezes.No rales.  Abdominal: Soft. Bowel sounds are normal. No distension. There is no tenderness. There is no guarding.  Musculoskeletal: No edema or tenderness.  Lymphadenopathy:    No cervical or supraclavicular adenopathy.  Neurological: Alert. No cranial nerve deficit.  Skin: Skin is warm and dry. No rash noted. No erythema. No pallor.  Psychiatric: Affect and judgment normal.    LABORATORY DATA: I have personally reviewed the data as listed:  Office Visit on 04/14/2017  Component Date Value Ref Range Status  . INR 04/14/2017 1.9   Final  . Spec Grav, UA 04/14/2017 >=1.030* 1.010 - 1.025 Final  . pH, UA 04/14/2017 5.0  5.0 - 8.0 Final  . Leukocytes, UA 04/14/2017 Trace* Negative Final  . Urine Culture, Routine 04/14/2017 Final report*  Final  . Organism ID, Bacteria 04/14/2017 Escherichia coli*  Final   Comment: 50,000-100,000 colony forming units per mL Cefazolin <=4 ug/mL Cefazolin with an MIC <=16 predicts susceptibility to  the oral agents cefaclor, cefdinir, cefpodoxime, cefprozil, cefuroxime, cephalexin, and loracarbef when used for therapy of uncomplicated urinary tract infections due to E. coli, Klebsiella pneumoniae, and Proteus mirabilis.   Marland Kitchen ORGANISM ID, BACTERIA 04/14/2017 Comment   Final   Comment: Mixed urogenital flora Less than 10,000 colonies/mL   . Antimicrobial Susceptibility 04/14/2017 Comment   Final   Comment:       ** S = Susceptible; I = Intermediate; R = Resistant **                    P = Positive; N = Negative             MICS are expressed in micrograms per mL    Antibiotic                 RSLT#1    RSLT#2  RSLT#3    RSLT#4 Amoxicillin/Clavulanic Acid    S Ampicillin                     I Cefepime                       S Ceftriaxone                    S Cefuroxime                     S Ciprofloxacin                  S Ertapenem                      S Gentamicin                     S Imipenem                       S Levofloxacin                   S Meropenem                      S Nitrofurantoin                 S Piperacillin/Tazobactam        S Tetracycline                   S Tobramycin                     S Trimethoprim/Sulfa             S     RADIOGRAPHIC STUDIES: I have personally reviewed the radiological images as listed and agree with the findings in the report  No results found.  ASSESSMENT/PLAN 83 y.o. year old female with a history of a DVT after childbirth more than 70 years ago and an unprovoked PE about 25-30 years ago presents for anticoagulation recommendations. She has been on warfarin for at least 25 years since she was diagnosed with the PE. Patient has been having worsening ataxia and having frequent falls.   -I have discussed with the patient and her daugther that she is at high risk for bleeding due to her frequent falls. We discussed the risks and benefits of continuing chronic anticoagulation. Since she is sedentary, she is at higher risk of  having a recurrent PE/DVT. After a long discussion, it was agreed that her risk of bleeding, especially at her advanced age, outweighs the benefit at this time. The patient and her daughter were in agreement regarding stopping anticoagulation. Since this decision was made, I do not believe doing a full hypercoagulable workup would make a difference in her management. Patient will discontinue anticoagulation with coumadin at this time. I have advised her to wear compression stockings to decrease future risk of DVT.  -RTC PRN.  All questions were answered. The patient knows to call the clinic with any problems, questions or concerns.  This note was electronically signed.    Ralene Cork, MD  05/02/2017 9:20 PM

## 2017-05-03 ENCOUNTER — Telehealth: Payer: Self-pay | Admitting: Family Medicine

## 2017-05-03 ENCOUNTER — Other Ambulatory Visit: Payer: Self-pay | Admitting: Family Medicine

## 2017-05-03 MED ORDER — TRAMADOL HCL 50 MG PO TABS: ORAL_TABLET | ORAL | 0 refills | Status: AC

## 2017-05-03 NOTE — Progress Notes (Signed)
This patient's Coumadin was stopped after consultation with hematology. Please see their dictation. I agree with stopping anticoagulant because the risk of bleeding outweighs any benefit.

## 2017-05-03 NOTE — Telephone Encounter (Signed)
Nurse's-it was recommended for the patient to stop Coumadin because of the risk of bleeding. Hematology made this recommendation. The medication was discontinued in Epic by myself. Please make sure that her pharmacy [facility] is notified that we have stopped Coumadin so they no longer fill this medicine. Also on our Coumadin clinic sheet please make note dictation that the patient stopped Coumadin on 05/03/2017 per direction of hematology with our agreement thank you

## 2017-05-03 NOTE — Telephone Encounter (Signed)
May have this refill + for additional

## 2017-05-05 NOTE — Telephone Encounter (Signed)
[facility] called and med canceled. Note put in INR  Book that coumadin was D/C

## 2017-05-08 ENCOUNTER — Other Ambulatory Visit: Payer: Self-pay | Admitting: Family Medicine

## 2017-05-09 ENCOUNTER — Other Ambulatory Visit: Payer: Self-pay | Admitting: Family Medicine

## 2017-05-27 DIAGNOSIS — M6281 Muscle weakness (generalized): Secondary | ICD-10-CM | POA: Diagnosis not present

## 2017-05-27 DIAGNOSIS — J449 Chronic obstructive pulmonary disease, unspecified: Secondary | ICD-10-CM | POA: Diagnosis not present

## 2017-05-27 DIAGNOSIS — L89152 Pressure ulcer of sacral region, stage 2: Secondary | ICD-10-CM | POA: Diagnosis not present

## 2017-05-27 DIAGNOSIS — I82409 Acute embolism and thrombosis of unspecified deep veins of unspecified lower extremity: Secondary | ICD-10-CM | POA: Diagnosis not present

## 2017-06-06 ENCOUNTER — Encounter: Payer: Self-pay | Admitting: Family Medicine

## 2017-06-06 ENCOUNTER — Ambulatory Visit (INDEPENDENT_AMBULATORY_CARE_PROVIDER_SITE_OTHER): Payer: Medicare HMO | Admitting: Family Medicine

## 2017-06-06 VITALS — BP 122/80 | Temp 97.5°F | Ht 65.0 in | Wt 131.0 lb

## 2017-06-06 DIAGNOSIS — R300 Dysuria: Secondary | ICD-10-CM

## 2017-06-06 DIAGNOSIS — Z23 Encounter for immunization: Secondary | ICD-10-CM | POA: Diagnosis not present

## 2017-06-06 DIAGNOSIS — N289 Disorder of kidney and ureter, unspecified: Secondary | ICD-10-CM

## 2017-06-06 DIAGNOSIS — R197 Diarrhea, unspecified: Secondary | ICD-10-CM | POA: Diagnosis not present

## 2017-06-06 DIAGNOSIS — E119 Type 2 diabetes mellitus without complications: Secondary | ICD-10-CM | POA: Diagnosis not present

## 2017-06-06 DIAGNOSIS — N390 Urinary tract infection, site not specified: Secondary | ICD-10-CM | POA: Diagnosis not present

## 2017-06-06 LAB — POCT GLUCOSE (DEVICE FOR HOME USE): POC GLUCOSE: 163 mg/dL — AB (ref 70–99)

## 2017-06-06 LAB — POCT URINALYSIS DIPSTICK
Glucose, UA: 500
PH UA: 5 (ref 5.0–8.0)
Spec Grav, UA: >=1.03 — AB (ref 1.010–1.025)

## 2017-06-06 MED ORDER — CEFPROZIL 250 MG PO TABS: 250.0000 mg | ORAL_TABLET | Freq: Two times a day (BID) | ORAL | 0 refills | Status: DC

## 2017-06-06 NOTE — Progress Notes (Signed)
Subjective:    Patient ID: Natalie Rogers, female    DOB: 09-26-1923, 83 y.o.   MRN: [id number]  Urinary Tract Infection    This patient does have diabetes she takes metformin her kidney functions overall good but she is having diarrhea issues. Family states her sugars seem to be under decent control the patient does not eat large amounts  The patient has significant insomnia issues medications cause too much drowsiness or caused her to talk out of her head she does have dementia issues and this does get worse at nighttime   Patient here today with daughter Aubreyana Saltz. States her mom was complaining of the burning while urinating last week. Has not complained this week, but she is acting different. Sleeping in the day and not sleeping at night.Talking out of her head. Results for orders placed or performed in visit on 06/06/17  POCT urinalysis dipstick  Result Value Ref Range   Color, UA     Clarity, UA     Glucose, UA 500    Bilirubin, UA ++    Ketones, UA     Spec Grav, UA >=1.030 (A) 1.010 - 1.025   Blood, UA trace    pH, UA 5.0 5.0 - 8.0   Protein, UA     Urobilinogen, UA  0.2 or 1.0 E.U./dL   Nitrite, UA +    Leukocytes, UA 4+ (A) Negative   Results for orders placed or performed in visit on 06/06/17  POCT urinalysis dipstick  Result Value Ref Range   Color, UA     Clarity, UA     Glucose, UA 500    Bilirubin, UA ++    Ketones, UA     Spec Grav, UA >=1.030 (A) 1.010 - 1.025   Blood, UA trace    pH, UA 5.0 5.0 - 8.0   Protein, UA     Urobilinogen, UA  0.2 or 1.0 E.U./dL   Nitrite, UA +    Leukocytes, UA 4+ (A) Negative  POCT Glucose (Device for Home Use)  Result Value Ref Range   Glucose Fasting, POC  70 - 99 mg/dL   POC Glucose 782 (A) 70 - 99 mg/dl     Review of Systems  Constitutional: Negative for activity change, appetite change and fatigue.  HENT: Negative for congestion.   Respiratory: Negative for cough.   Cardiovascular: Negative for chest pain.    Gastrointestinal: Negative for abdominal pain.  Endocrine: Negative for polydipsia and polyphagia.  Skin: Negative for color change.  Neurological: Negative for weakness.  Psychiatric/Behavioral: Negative for confusion.       Objective:   Physical Exam  Constitutional: She appears well-developed and well-nourished. No distress.  HENT:  Head: Normocephalic and atraumatic.  Eyes: Right eye exhibits no discharge. Left eye exhibits no discharge.  Neck: No tracheal deviation present.  Cardiovascular: Normal rate, regular rhythm and normal heart sounds.   No murmur heard. Pulmonary/Chest: Effort normal and breath sounds normal. No respiratory distress. She has no wheezes. She has no rales.  Musculoskeletal: She exhibits no edema.  Lymphadenopathy:    She has no cervical adenopathy.  Neurological: She is alert. She exhibits normal muscle tone.  Skin: Skin is warm and dry. No erythema.  Psychiatric: Her behavior is normal.  Vitals reviewed.   25 minutes was spent with the patient. Greater than half the time was spent in discussion and answering questions and counseling regarding the issues that the patient came in for today.  Assessment & Plan:  Urinary tract infection-urine sent for urine culture antibiotics prescribed follow-up if ongoing troubles  Dementia yeah stable continue current medications  Insomnia related to age and dementia behavioral techniques recommended  Diabetes under good control check lab work await results  Renal insufficiency under good control check lab work  May switch away from metformin depending on how patient is doing

## 2017-06-07 DIAGNOSIS — E119 Type 2 diabetes mellitus without complications: Secondary | ICD-10-CM | POA: Diagnosis not present

## 2017-06-08 LAB — HEMOGLOBIN A1C
Est. average glucose Bld gHb Est-mCnc: 154 mg/dL
HEMOGLOBIN A1C: 7 % — AB (ref 4.8–5.6)

## 2017-06-08 LAB — URINE CULTURE

## 2017-06-08 LAB — SPECIMEN STATUS REPORT

## 2017-06-08 LAB — BASIC METABOLIC PANEL
BUN / CREAT RATIO: 13 (ref 12–28)
BUN: 9 mg/dL — AB (ref 10–36)
CHLORIDE: 95 mmol/L — AB (ref 96–106)
CO2: 24 mmol/L (ref 20–29)
Calcium: 9.4 mg/dL (ref 8.7–10.3)
Creatinine, Ser: 0.69 mg/dL (ref 0.57–1.00)
GFR calc non Af Amer: 76 mL/min/{1.73_m2} (ref >59)
GFR, EST AFRICAN AMERICAN: 88 mL/min/{1.73_m2} (ref >59)
Glucose: 237 mg/dL — ABNORMAL HIGH (ref 65–99)
POTASSIUM: 4.7 mmol/L (ref 3.5–5.2)
SODIUM: 140 mmol/L (ref 134–144)

## 2017-06-15 ENCOUNTER — Ambulatory Visit: Payer: Medicare HMO | Admitting: Family Medicine

## 2017-06-26 ENCOUNTER — Other Ambulatory Visit: Payer: Self-pay | Admitting: Family Medicine

## 2017-06-27 DIAGNOSIS — J449 Chronic obstructive pulmonary disease, unspecified: Secondary | ICD-10-CM | POA: Diagnosis not present

## 2017-06-27 DIAGNOSIS — I82409 Acute embolism and thrombosis of unspecified deep veins of unspecified lower extremity: Secondary | ICD-10-CM | POA: Diagnosis not present

## 2017-06-27 DIAGNOSIS — M6281 Muscle weakness (generalized): Secondary | ICD-10-CM | POA: Diagnosis not present

## 2017-06-27 DIAGNOSIS — L89152 Pressure ulcer of sacral region, stage 2: Secondary | ICD-10-CM | POA: Diagnosis not present

## 2017-06-30 ENCOUNTER — Other Ambulatory Visit: Payer: Self-pay | Admitting: Family Medicine

## 2017-07-27 DIAGNOSIS — L89152 Pressure ulcer of sacral region, stage 2: Secondary | ICD-10-CM | POA: Diagnosis not present

## 2017-07-27 DIAGNOSIS — M6281 Muscle weakness (generalized): Secondary | ICD-10-CM | POA: Diagnosis not present

## 2017-07-27 DIAGNOSIS — I82409 Acute embolism and thrombosis of unspecified deep veins of unspecified lower extremity: Secondary | ICD-10-CM | POA: Diagnosis not present

## 2017-07-27 DIAGNOSIS — J449 Chronic obstructive pulmonary disease, unspecified: Secondary | ICD-10-CM | POA: Diagnosis not present

## 2017-08-02 ENCOUNTER — Other Ambulatory Visit: Payer: Self-pay | Admitting: Family Medicine

## 2017-08-02 MED ORDER — CEFPROZIL 250 MG PO TABS: 250.0000 mg | ORAL_TABLET | Freq: Two times a day (BID) | ORAL | 0 refills | Status: AC

## 2017-08-07 ENCOUNTER — Other Ambulatory Visit: Payer: Self-pay | Admitting: Family Medicine

## 2017-08-13 ENCOUNTER — Other Ambulatory Visit: Payer: Self-pay | Admitting: Family Medicine

## 2017-08-13 DIAGNOSIS — R270 Ataxia, unspecified: Secondary | ICD-10-CM

## 2017-08-14 MED ORDER — NITROFURANTOIN MONOHYD MACRO 100 MG PO CAPS: 100.0000 mg | ORAL_CAPSULE | Freq: Two times a day (BID) | ORAL | 0 refills | Status: AC

## 2017-08-14 NOTE — Telephone Encounter (Signed)
Macrobid 100 mg 1 twice daily for 7 days, may give referral for home health and physical therapy due to ataxia and weakness

## 2017-08-21 ENCOUNTER — Telehealth: Payer: Self-pay | Admitting: Family Medicine

## 2017-08-21 NOTE — Telephone Encounter (Signed)
Home Health was ordered for eval & treat for pt's Ataxia  Pt has not been seen since October.  Insurance requires a face to face visit before home health can go out for eval  [facility] (pt's used them before)    Please advise

## 2017-08-21 NOTE — Telephone Encounter (Signed)
Please inform the family that face-to-face evaluation is necessary in order to get the home health initiated and the physical therapy covered.  Therefore I recommend a standard office visit within the next couple weeks if they want to proceed forward on this particular issue

## 2017-08-21 NOTE — Telephone Encounter (Signed)
I spoke with Natalie Rogers,she will bring pt in as requested.

## 2017-08-21 NOTE — Telephone Encounter (Signed)
Please advise 

## 2017-08-27 DIAGNOSIS — I82409 Acute embolism and thrombosis of unspecified deep veins of unspecified lower extremity: Secondary | ICD-10-CM | POA: Diagnosis not present

## 2017-08-27 DIAGNOSIS — L89152 Pressure ulcer of sacral region, stage 2: Secondary | ICD-10-CM | POA: Diagnosis not present

## 2017-08-27 DIAGNOSIS — M6281 Muscle weakness (generalized): Secondary | ICD-10-CM | POA: Diagnosis not present

## 2017-08-27 DIAGNOSIS — J449 Chronic obstructive pulmonary disease, unspecified: Secondary | ICD-10-CM | POA: Diagnosis not present

## 2017-08-28 DIAGNOSIS — I6789 Other cerebrovascular disease: Secondary | ICD-10-CM | POA: Diagnosis not present

## 2017-08-28 DIAGNOSIS — R471 Dysarthria and anarthria: Secondary | ICD-10-CM | POA: Diagnosis not present

## 2017-08-28 DIAGNOSIS — J441 Chronic obstructive pulmonary disease with (acute) exacerbation: Secondary | ICD-10-CM | POA: Diagnosis not present

## 2017-08-28 DIAGNOSIS — I6523 Occlusion and stenosis of bilateral carotid arteries: Secondary | ICD-10-CM | POA: Diagnosis not present

## 2017-08-28 DIAGNOSIS — R4701 Aphasia: Secondary | ICD-10-CM | POA: Diagnosis not present

## 2017-08-28 DIAGNOSIS — E785 Hyperlipidemia, unspecified: Secondary | ICD-10-CM | POA: Diagnosis not present

## 2017-08-28 DIAGNOSIS — R531 Weakness: Secondary | ICD-10-CM | POA: Diagnosis not present

## 2017-08-28 DIAGNOSIS — F0390 Unspecified dementia without behavioral disturbance: Secondary | ICD-10-CM | POA: Diagnosis not present

## 2017-08-28 DIAGNOSIS — I69992 Facial weakness following unspecified cerebrovascular disease: Secondary | ICD-10-CM | POA: Diagnosis not present

## 2017-08-28 DIAGNOSIS — R29706 NIHSS score 6: Secondary | ICD-10-CM | POA: Diagnosis not present

## 2017-08-28 DIAGNOSIS — R279 Unspecified lack of coordination: Secondary | ICD-10-CM | POA: Diagnosis not present

## 2017-08-28 DIAGNOSIS — Z7401 Bed confinement status: Secondary | ICD-10-CM | POA: Diagnosis not present

## 2017-08-28 DIAGNOSIS — J449 Chronic obstructive pulmonary disease, unspecified: Secondary | ICD-10-CM | POA: Diagnosis not present

## 2017-08-28 DIAGNOSIS — R5381 Other malaise: Secondary | ICD-10-CM | POA: Diagnosis not present

## 2017-08-28 DIAGNOSIS — R29810 Facial weakness: Secondary | ICD-10-CM | POA: Diagnosis not present

## 2017-08-28 DIAGNOSIS — E119 Type 2 diabetes mellitus without complications: Secondary | ICD-10-CM | POA: Diagnosis not present

## 2017-08-28 DIAGNOSIS — K219 Gastro-esophageal reflux disease without esophagitis: Secondary | ICD-10-CM | POA: Diagnosis not present

## 2017-08-28 DIAGNOSIS — R41841 Cognitive communication deficit: Secondary | ICD-10-CM | POA: Diagnosis not present

## 2017-08-28 DIAGNOSIS — I639 Cerebral infarction, unspecified: Secondary | ICD-10-CM | POA: Diagnosis not present

## 2017-08-28 DIAGNOSIS — R1312 Dysphagia, oropharyngeal phase: Secondary | ICD-10-CM | POA: Diagnosis not present

## 2017-08-28 DIAGNOSIS — R29818 Other symptoms and signs involving the nervous system: Secondary | ICD-10-CM | POA: Diagnosis not present

## 2017-08-28 DIAGNOSIS — G8194 Hemiplegia, unspecified affecting left nondominant side: Secondary | ICD-10-CM | POA: Diagnosis not present

## 2017-08-28 DIAGNOSIS — I10 Essential (primary) hypertension: Secondary | ICD-10-CM | POA: Diagnosis not present

## 2017-08-29 ENCOUNTER — Ambulatory Visit: Payer: Medicare HMO | Admitting: Family Medicine

## 2017-09-05 DIAGNOSIS — I639 Cerebral infarction, unspecified: Secondary | ICD-10-CM | POA: Diagnosis not present

## 2017-09-05 DIAGNOSIS — R1312 Dysphagia, oropharyngeal phase: Secondary | ICD-10-CM | POA: Diagnosis not present

## 2017-09-05 DIAGNOSIS — F0390 Unspecified dementia without behavioral disturbance: Secondary | ICD-10-CM | POA: Diagnosis not present

## 2017-09-05 DIAGNOSIS — I10 Essential (primary) hypertension: Secondary | ICD-10-CM | POA: Diagnosis not present

## 2017-09-05 DIAGNOSIS — R41841 Cognitive communication deficit: Secondary | ICD-10-CM | POA: Diagnosis not present

## 2017-09-05 DIAGNOSIS — G8194 Hemiplegia, unspecified affecting left nondominant side: Secondary | ICD-10-CM | POA: Diagnosis not present

## 2017-09-05 DIAGNOSIS — R279 Unspecified lack of coordination: Secondary | ICD-10-CM | POA: Diagnosis not present

## 2017-09-05 DIAGNOSIS — Z7401 Bed confinement status: Secondary | ICD-10-CM | POA: Diagnosis not present

## 2017-09-05 DIAGNOSIS — J441 Chronic obstructive pulmonary disease with (acute) exacerbation: Secondary | ICD-10-CM | POA: Diagnosis not present

## 2017-09-05 DIAGNOSIS — E785 Hyperlipidemia, unspecified: Secondary | ICD-10-CM | POA: Diagnosis not present

## 2017-09-05 DIAGNOSIS — J449 Chronic obstructive pulmonary disease, unspecified: Secondary | ICD-10-CM | POA: Diagnosis not present

## 2017-09-05 DIAGNOSIS — E119 Type 2 diabetes mellitus without complications: Secondary | ICD-10-CM | POA: Diagnosis not present

## 2017-09-05 DIAGNOSIS — I6789 Other cerebrovascular disease: Secondary | ICD-10-CM | POA: Diagnosis not present

## 2017-09-07 ENCOUNTER — Other Ambulatory Visit: Payer: Self-pay

## 2017-09-07 NOTE — Patient Outreach (Signed)
[facility]) Care Management  09/07/2017  Natalie DoppDorothy M Rogers [birth year] [id number]   Transition of care  Referral date: 09/06/17 Referral source: [facility] Insurance: [employer]   Telephone call to patient regarding transition of care follow up. Unable to reach patient. HIPAA compliant voice message left with call back phone number.   PLAN: RNCM will attempt 2nd telephone outreach to patient within 3 business days.   [name] RN,BSN,CCM [facility] Telephonic  [phone]

## 2017-09-08 ENCOUNTER — Other Ambulatory Visit: Payer: Self-pay

## 2017-09-08 NOTE — Patient Outreach (Signed)
[facility]) Care Management  09/08/2017  Natalie DoppDorothy M Rogers 11/27/1923 [id number]  Transition of care  Referral date: 09/06/17 Referral source: [facility]17 Insurance: [employer]  Attempt #1  Telephone call to patient / designated party release Natalie Rogers/ daughter.  Spoke with Natalie Rogers who verified HIPAA for patient. Natalie Rogers states patient has dementia and recently had a stroke.  Natalie Rogers states patient is currently in a skilled nursing facility for rehab.  RNCM gave NatalieRogers name and contact phone number.  Requested call when patient discharged from skilled facility.   PLAN; RNCM will follow up with Natalie Rogers within 2 week for patients discharge disposition.  RNCM will send patient/ daughter [facility] care [facility] brochure with contact information.  Natalie InaDavina Biruk Troia RN,BSN,CCM [facility] Telephonic  [phone]

## 2017-09-18 ENCOUNTER — Other Ambulatory Visit: Payer: Self-pay

## 2017-09-18 NOTE — Patient Outreach (Signed)
[facility]) Care Management  09/18/2017  Natalie Rogers October 14, 1923 [id number]  Transition of care  Referral date: 09/06/17 Referral source: [facility]17 Insurance: [employer]  Attempt #3  Telephone call to patient regarding transition of care follow up. Unable to reach patient. HIPAA compliant voice message left with call back phone number.   PLAN: RNCM will send patient outreach letter to attempt contact.  George InaDavina Yatzary Merriweather RN,BSN,CCM [facility] Telephonic  [phone]

## 2017-10-02 ENCOUNTER — Other Ambulatory Visit: Payer: Self-pay

## 2017-10-02 NOTE — Patient Outreach (Signed)
[facility]) Care Management  10/02/2017  [name] 1923-10-09 [id number]   No response from patient after 3 telephone calls and outreach letter attempt.  PLAN; RNCM will refer patient to care management assistant to close patient due to being unable to reach.  RNCM will send notification to patients primary MD of closure.   George InaDavina Ahnesti Townsend RN,BSN,CCM [facility] Telephonic  [phone]

## 2017-10-03 ENCOUNTER — Other Ambulatory Visit: Payer: Self-pay

## 2017-10-03 NOTE — Patient Outreach (Signed)
[facility]) Care Management  10/03/2017  Phil DoppDorothy M Azzara 1923/08/30 [id number]  Care coordination: Returned call to patient's daughter, Rupert StacksJulie Heigl.  Daughter states patient is still in [facility] [place].  Daughter states she is not sure when patient will be discharged from the facility. Daughter states patient is eligible for Medicaid coverage for the skilled nursing facility as long as she continues to receive therapy. Daughter states patient has been approved for the [employer] program and she will be meeting with [employer] representative on 10/23/17.  Daughter states patient has all necessary medical equipment needed for home:  Wheelchair, walker, shower chair, bedside commode, hospital bed.  Daughter states the skilled nursing facility informed her they would let her know 48 hours prior to patients discharge. RNCM encouraged daughter to  Talk with physical therapist again about patients progression and potential projected discharge date.  Daughter verbalized understanding. RNCM confirmed [facility].  Advised daughter to [facility] once patient is discharged.  Daughter verbalized understanding.  PLAN: No additional follow up needed by [facility] at this time. Daughter provided contact information [facility].   George InaDavina Olinda Nola RN,BSN,CCM [facility] Telephonic  [phone]

## 2017-10-13 ENCOUNTER — Telehealth: Payer: Self-pay | Admitting: *Deleted

## 2017-10-13 NOTE — Telephone Encounter (Signed)
Natalie HughsKecia from home health calling to inform you that they did receive referral and they will be going out to see patient tomorrow.

## 2017-10-14 DIAGNOSIS — I69354 Hemiplegia and hemiparesis following cerebral infarction affecting left non-dominant side: Secondary | ICD-10-CM | POA: Diagnosis not present

## 2017-10-14 DIAGNOSIS — E119 Type 2 diabetes mellitus without complications: Secondary | ICD-10-CM | POA: Diagnosis not present

## 2017-10-14 DIAGNOSIS — I10 Essential (primary) hypertension: Secondary | ICD-10-CM | POA: Diagnosis not present

## 2017-10-14 DIAGNOSIS — F0390 Unspecified dementia without behavioral disturbance: Secondary | ICD-10-CM | POA: Diagnosis not present

## 2017-10-14 DIAGNOSIS — F329 Major depressive disorder, single episode, unspecified: Secondary | ICD-10-CM | POA: Diagnosis not present

## 2017-10-14 DIAGNOSIS — J449 Chronic obstructive pulmonary disease, unspecified: Secondary | ICD-10-CM | POA: Diagnosis not present

## 2017-10-14 NOTE — Telephone Encounter (Signed)
ok 

## 2017-10-16 ENCOUNTER — Other Ambulatory Visit: Payer: Self-pay

## 2017-10-16 DIAGNOSIS — F0390 Unspecified dementia without behavioral disturbance: Secondary | ICD-10-CM | POA: Diagnosis not present

## 2017-10-16 DIAGNOSIS — J449 Chronic obstructive pulmonary disease, unspecified: Secondary | ICD-10-CM | POA: Diagnosis not present

## 2017-10-16 DIAGNOSIS — E119 Type 2 diabetes mellitus without complications: Secondary | ICD-10-CM | POA: Diagnosis not present

## 2017-10-16 DIAGNOSIS — F329 Major depressive disorder, single episode, unspecified: Secondary | ICD-10-CM | POA: Diagnosis not present

## 2017-10-16 DIAGNOSIS — I69354 Hemiplegia and hemiparesis following cerebral infarction affecting left non-dominant side: Secondary | ICD-10-CM | POA: Diagnosis not present

## 2017-10-16 DIAGNOSIS — I10 Essential (primary) hypertension: Secondary | ICD-10-CM | POA: Diagnosis not present

## 2017-10-16 NOTE — Patient Outreach (Signed)
[facility] Care Management  10/16/2017  Phil DoppDorothy M Tousley 1924-07-10 [id number]   Transition of care  Referral date :10/12/17 [facility] source[facility] Insurance:[employer]  Attempt #1  Telephone call to patient regarding transition of care follow up. Unable to reach patient. HIPAA compliant voice message left with call back phone number.  PLAN:RNCM will  attempt 2nd telephone call within 3 business days.   George InaDavina Langley Ingalls RN,BSN,CCM [facility] Telephonic  [phone]

## 2017-10-17 ENCOUNTER — Other Ambulatory Visit: Payer: Self-pay

## 2017-10-17 DIAGNOSIS — F0390 Unspecified dementia without behavioral disturbance: Secondary | ICD-10-CM | POA: Diagnosis not present

## 2017-10-17 DIAGNOSIS — I69354 Hemiplegia and hemiparesis following cerebral infarction affecting left non-dominant side: Secondary | ICD-10-CM | POA: Diagnosis not present

## 2017-10-17 DIAGNOSIS — E119 Type 2 diabetes mellitus without complications: Secondary | ICD-10-CM | POA: Diagnosis not present

## 2017-10-17 DIAGNOSIS — I10 Essential (primary) hypertension: Secondary | ICD-10-CM | POA: Diagnosis not present

## 2017-10-17 DIAGNOSIS — J449 Chronic obstructive pulmonary disease, unspecified: Secondary | ICD-10-CM | POA: Diagnosis not present

## 2017-10-17 DIAGNOSIS — F329 Major depressive disorder, single episode, unspecified: Secondary | ICD-10-CM | POA: Diagnosis not present

## 2017-10-17 NOTE — Patient Outreach (Signed)
[facility] Care Management  10/17/2017  [name] 09-01-1923 [id number]   Transition of care Referral Date 10/12/17 [facility]   Telephone call for transition of care follow up. Female answered the phone and states he is her son Trey PaulaJeff.  He states patient is hard of hearing and cannot talk on the phone.  Asked him if Nena PolioLisa Buesing or Tollie PizzaJoyce Ward-DPR listed are available.  He states they are not.  Advised that I could not talk with him as he is not on the designated party list that I have.  He verbalized understanding.  Advised him to have Nena PolioLisa Schroepfer give CM [name] a call as she has been in contact with her. He states he would.    Plan:  RN CM will send letter and notify CM [name] of contact.  Bary Lericheionne J Thea Holshouser, RN, MSN [facility] Care Management Care Management Coordinator Direct Line [phone] Toll Free: [phone]  Fax: [phone]

## 2017-10-19 DIAGNOSIS — F329 Major depressive disorder, single episode, unspecified: Secondary | ICD-10-CM | POA: Diagnosis not present

## 2017-10-19 DIAGNOSIS — J449 Chronic obstructive pulmonary disease, unspecified: Secondary | ICD-10-CM | POA: Diagnosis not present

## 2017-10-19 DIAGNOSIS — I69354 Hemiplegia and hemiparesis following cerebral infarction affecting left non-dominant side: Secondary | ICD-10-CM | POA: Diagnosis not present

## 2017-10-19 DIAGNOSIS — E119 Type 2 diabetes mellitus without complications: Secondary | ICD-10-CM | POA: Diagnosis not present

## 2017-10-19 DIAGNOSIS — I10 Essential (primary) hypertension: Secondary | ICD-10-CM | POA: Diagnosis not present

## 2017-10-19 DIAGNOSIS — F0390 Unspecified dementia without behavioral disturbance: Secondary | ICD-10-CM | POA: Diagnosis not present

## 2017-10-20 DIAGNOSIS — I10 Essential (primary) hypertension: Secondary | ICD-10-CM | POA: Diagnosis not present

## 2017-10-20 DIAGNOSIS — F0390 Unspecified dementia without behavioral disturbance: Secondary | ICD-10-CM | POA: Diagnosis not present

## 2017-10-20 DIAGNOSIS — F329 Major depressive disorder, single episode, unspecified: Secondary | ICD-10-CM | POA: Diagnosis not present

## 2017-10-20 DIAGNOSIS — J449 Chronic obstructive pulmonary disease, unspecified: Secondary | ICD-10-CM | POA: Diagnosis not present

## 2017-10-20 DIAGNOSIS — I69354 Hemiplegia and hemiparesis following cerebral infarction affecting left non-dominant side: Secondary | ICD-10-CM | POA: Diagnosis not present

## 2017-10-20 DIAGNOSIS — E119 Type 2 diabetes mellitus without complications: Secondary | ICD-10-CM | POA: Diagnosis not present

## 2017-10-23 DIAGNOSIS — I10 Essential (primary) hypertension: Secondary | ICD-10-CM | POA: Diagnosis not present

## 2017-10-23 DIAGNOSIS — F0390 Unspecified dementia without behavioral disturbance: Secondary | ICD-10-CM | POA: Diagnosis not present

## 2017-10-23 DIAGNOSIS — J449 Chronic obstructive pulmonary disease, unspecified: Secondary | ICD-10-CM | POA: Diagnosis not present

## 2017-10-23 DIAGNOSIS — F329 Major depressive disorder, single episode, unspecified: Secondary | ICD-10-CM | POA: Diagnosis not present

## 2017-10-23 DIAGNOSIS — I69354 Hemiplegia and hemiparesis following cerebral infarction affecting left non-dominant side: Secondary | ICD-10-CM | POA: Diagnosis not present

## 2017-10-23 DIAGNOSIS — E119 Type 2 diabetes mellitus without complications: Secondary | ICD-10-CM | POA: Diagnosis not present

## 2017-10-24 DIAGNOSIS — F329 Major depressive disorder, single episode, unspecified: Secondary | ICD-10-CM | POA: Diagnosis not present

## 2017-10-24 DIAGNOSIS — I69354 Hemiplegia and hemiparesis following cerebral infarction affecting left non-dominant side: Secondary | ICD-10-CM | POA: Diagnosis not present

## 2017-10-24 DIAGNOSIS — I10 Essential (primary) hypertension: Secondary | ICD-10-CM | POA: Diagnosis not present

## 2017-10-24 DIAGNOSIS — E119 Type 2 diabetes mellitus without complications: Secondary | ICD-10-CM | POA: Diagnosis not present

## 2017-10-24 DIAGNOSIS — J449 Chronic obstructive pulmonary disease, unspecified: Secondary | ICD-10-CM | POA: Diagnosis not present

## 2017-10-24 DIAGNOSIS — F0390 Unspecified dementia without behavioral disturbance: Secondary | ICD-10-CM | POA: Diagnosis not present

## 2017-10-25 DIAGNOSIS — F0390 Unspecified dementia without behavioral disturbance: Secondary | ICD-10-CM | POA: Diagnosis not present

## 2017-10-25 DIAGNOSIS — E119 Type 2 diabetes mellitus without complications: Secondary | ICD-10-CM | POA: Diagnosis not present

## 2017-10-25 DIAGNOSIS — J449 Chronic obstructive pulmonary disease, unspecified: Secondary | ICD-10-CM | POA: Diagnosis not present

## 2017-10-25 DIAGNOSIS — I69354 Hemiplegia and hemiparesis following cerebral infarction affecting left non-dominant side: Secondary | ICD-10-CM | POA: Diagnosis not present

## 2017-10-25 DIAGNOSIS — I10 Essential (primary) hypertension: Secondary | ICD-10-CM | POA: Diagnosis not present

## 2017-10-25 DIAGNOSIS — F329 Major depressive disorder, single episode, unspecified: Secondary | ICD-10-CM | POA: Diagnosis not present

## 2017-10-26 DIAGNOSIS — J449 Chronic obstructive pulmonary disease, unspecified: Secondary | ICD-10-CM | POA: Diagnosis not present

## 2017-10-26 DIAGNOSIS — E119 Type 2 diabetes mellitus without complications: Secondary | ICD-10-CM | POA: Diagnosis not present

## 2017-10-26 DIAGNOSIS — I69354 Hemiplegia and hemiparesis following cerebral infarction affecting left non-dominant side: Secondary | ICD-10-CM | POA: Diagnosis not present

## 2017-10-26 DIAGNOSIS — I10 Essential (primary) hypertension: Secondary | ICD-10-CM | POA: Diagnosis not present

## 2017-10-26 DIAGNOSIS — F329 Major depressive disorder, single episode, unspecified: Secondary | ICD-10-CM | POA: Diagnosis not present

## 2017-10-26 DIAGNOSIS — F0390 Unspecified dementia without behavioral disturbance: Secondary | ICD-10-CM | POA: Diagnosis not present

## 2017-10-30 DIAGNOSIS — F0390 Unspecified dementia without behavioral disturbance: Secondary | ICD-10-CM | POA: Diagnosis not present

## 2017-10-30 DIAGNOSIS — E119 Type 2 diabetes mellitus without complications: Secondary | ICD-10-CM | POA: Diagnosis not present

## 2017-10-30 DIAGNOSIS — I10 Essential (primary) hypertension: Secondary | ICD-10-CM | POA: Diagnosis not present

## 2017-10-30 DIAGNOSIS — I69354 Hemiplegia and hemiparesis following cerebral infarction affecting left non-dominant side: Secondary | ICD-10-CM | POA: Diagnosis not present

## 2017-10-30 DIAGNOSIS — J449 Chronic obstructive pulmonary disease, unspecified: Secondary | ICD-10-CM | POA: Diagnosis not present

## 2017-10-30 DIAGNOSIS — F329 Major depressive disorder, single episode, unspecified: Secondary | ICD-10-CM | POA: Diagnosis not present

## 2017-10-31 ENCOUNTER — Other Ambulatory Visit: Payer: Self-pay

## 2017-10-31 DIAGNOSIS — I10 Essential (primary) hypertension: Secondary | ICD-10-CM | POA: Diagnosis not present

## 2017-10-31 DIAGNOSIS — J449 Chronic obstructive pulmonary disease, unspecified: Secondary | ICD-10-CM | POA: Diagnosis not present

## 2017-10-31 DIAGNOSIS — I69354 Hemiplegia and hemiparesis following cerebral infarction affecting left non-dominant side: Secondary | ICD-10-CM | POA: Diagnosis not present

## 2017-10-31 DIAGNOSIS — F329 Major depressive disorder, single episode, unspecified: Secondary | ICD-10-CM | POA: Diagnosis not present

## 2017-10-31 DIAGNOSIS — F0390 Unspecified dementia without behavioral disturbance: Secondary | ICD-10-CM | POA: Diagnosis not present

## 2017-10-31 DIAGNOSIS — E119 Type 2 diabetes mellitus without complications: Secondary | ICD-10-CM | POA: Diagnosis not present

## 2017-10-31 NOTE — Patient Outreach (Signed)
[facility] Care Management  10/31/2017  Natalie DoppDorothy M Rogers February 15, 1924 [id number]   Transition of care  Referral date :10/12/17 [facility] Insurance:[employer] Attempt #3  Telephone call to patient regarding transition of care follow up. Unable to reach patient. HIPAA compliant voice message left with call back phone number.  PLAN:RNCM willrefer patient to care management assistant to close patient due to being unable to reach.  RNCM will send notification to patients primary MD of closure    [name] RN,BSN,CCM [facility] Telephonic  [phone]

## 2017-11-01 DIAGNOSIS — F0390 Unspecified dementia without behavioral disturbance: Secondary | ICD-10-CM | POA: Diagnosis not present

## 2017-11-01 DIAGNOSIS — I10 Essential (primary) hypertension: Secondary | ICD-10-CM | POA: Diagnosis not present

## 2017-11-01 DIAGNOSIS — I69354 Hemiplegia and hemiparesis following cerebral infarction affecting left non-dominant side: Secondary | ICD-10-CM | POA: Diagnosis not present

## 2017-11-01 DIAGNOSIS — E119 Type 2 diabetes mellitus without complications: Secondary | ICD-10-CM | POA: Diagnosis not present

## 2017-11-01 DIAGNOSIS — J449 Chronic obstructive pulmonary disease, unspecified: Secondary | ICD-10-CM | POA: Diagnosis not present

## 2017-11-01 DIAGNOSIS — F329 Major depressive disorder, single episode, unspecified: Secondary | ICD-10-CM | POA: Diagnosis not present

## 2017-11-02 DIAGNOSIS — F0390 Unspecified dementia without behavioral disturbance: Secondary | ICD-10-CM | POA: Diagnosis not present

## 2017-11-02 DIAGNOSIS — J449 Chronic obstructive pulmonary disease, unspecified: Secondary | ICD-10-CM | POA: Diagnosis not present

## 2017-11-02 DIAGNOSIS — I69354 Hemiplegia and hemiparesis following cerebral infarction affecting left non-dominant side: Secondary | ICD-10-CM | POA: Diagnosis not present

## 2017-11-02 DIAGNOSIS — E119 Type 2 diabetes mellitus without complications: Secondary | ICD-10-CM | POA: Diagnosis not present

## 2017-11-02 DIAGNOSIS — F329 Major depressive disorder, single episode, unspecified: Secondary | ICD-10-CM | POA: Diagnosis not present

## 2017-11-02 DIAGNOSIS — I10 Essential (primary) hypertension: Secondary | ICD-10-CM | POA: Diagnosis not present

## 2017-11-03 DIAGNOSIS — I10 Essential (primary) hypertension: Secondary | ICD-10-CM | POA: Diagnosis not present

## 2017-11-03 DIAGNOSIS — R404 Transient alteration of awareness: Secondary | ICD-10-CM | POA: Diagnosis not present

## 2017-11-03 DIAGNOSIS — R4182 Altered mental status, unspecified: Secondary | ICD-10-CM | POA: Diagnosis not present

## 2017-11-03 DIAGNOSIS — E785 Hyperlipidemia, unspecified: Secondary | ICD-10-CM | POA: Diagnosis not present

## 2017-11-03 DIAGNOSIS — G9349 Other encephalopathy: Secondary | ICD-10-CM | POA: Diagnosis not present

## 2017-11-03 DIAGNOSIS — G459 Transient cerebral ischemic attack, unspecified: Secondary | ICD-10-CM | POA: Diagnosis not present

## 2017-11-03 DIAGNOSIS — R531 Weakness: Secondary | ICD-10-CM | POA: Diagnosis not present

## 2017-11-03 DIAGNOSIS — F5104 Psychophysiologic insomnia: Secondary | ICD-10-CM | POA: Diagnosis not present

## 2017-11-03 DIAGNOSIS — E119 Type 2 diabetes mellitus without complications: Secondary | ICD-10-CM | POA: Diagnosis not present

## 2017-11-03 DIAGNOSIS — G309 Alzheimer's disease, unspecified: Secondary | ICD-10-CM | POA: Diagnosis not present

## 2017-11-03 DIAGNOSIS — E1165 Type 2 diabetes mellitus with hyperglycemia: Secondary | ICD-10-CM | POA: Diagnosis not present

## 2017-11-03 DIAGNOSIS — M5136 Other intervertebral disc degeneration, lumbar region: Secondary | ICD-10-CM | POA: Diagnosis not present

## 2017-11-03 DIAGNOSIS — I6381 Other cerebral infarction due to occlusion or stenosis of small artery: Secondary | ICD-10-CM | POA: Diagnosis not present

## 2017-11-03 DIAGNOSIS — F0280 Dementia in other diseases classified elsewhere without behavioral disturbance: Secondary | ICD-10-CM | POA: Diagnosis not present

## 2017-11-07 DIAGNOSIS — E119 Type 2 diabetes mellitus without complications: Secondary | ICD-10-CM | POA: Diagnosis not present

## 2017-11-07 DIAGNOSIS — J449 Chronic obstructive pulmonary disease, unspecified: Secondary | ICD-10-CM | POA: Diagnosis not present

## 2017-11-07 DIAGNOSIS — I69354 Hemiplegia and hemiparesis following cerebral infarction affecting left non-dominant side: Secondary | ICD-10-CM | POA: Diagnosis not present

## 2017-11-07 DIAGNOSIS — F329 Major depressive disorder, single episode, unspecified: Secondary | ICD-10-CM | POA: Diagnosis not present

## 2017-11-07 DIAGNOSIS — F0390 Unspecified dementia without behavioral disturbance: Secondary | ICD-10-CM | POA: Diagnosis not present

## 2017-11-07 DIAGNOSIS — I10 Essential (primary) hypertension: Secondary | ICD-10-CM | POA: Diagnosis not present

## 2017-11-08 DIAGNOSIS — E119 Type 2 diabetes mellitus without complications: Secondary | ICD-10-CM | POA: Diagnosis not present

## 2017-11-08 DIAGNOSIS — I69354 Hemiplegia and hemiparesis following cerebral infarction affecting left non-dominant side: Secondary | ICD-10-CM | POA: Diagnosis not present

## 2017-11-08 DIAGNOSIS — I10 Essential (primary) hypertension: Secondary | ICD-10-CM | POA: Diagnosis not present

## 2017-11-08 DIAGNOSIS — F0390 Unspecified dementia without behavioral disturbance: Secondary | ICD-10-CM | POA: Diagnosis not present

## 2017-11-08 DIAGNOSIS — F329 Major depressive disorder, single episode, unspecified: Secondary | ICD-10-CM | POA: Diagnosis not present

## 2017-11-08 DIAGNOSIS — J449 Chronic obstructive pulmonary disease, unspecified: Secondary | ICD-10-CM | POA: Diagnosis not present

## 2017-11-09 DIAGNOSIS — J449 Chronic obstructive pulmonary disease, unspecified: Secondary | ICD-10-CM | POA: Diagnosis not present

## 2017-11-09 DIAGNOSIS — I10 Essential (primary) hypertension: Secondary | ICD-10-CM | POA: Diagnosis not present

## 2017-11-09 DIAGNOSIS — E119 Type 2 diabetes mellitus without complications: Secondary | ICD-10-CM | POA: Diagnosis not present

## 2017-11-09 DIAGNOSIS — F0390 Unspecified dementia without behavioral disturbance: Secondary | ICD-10-CM | POA: Diagnosis not present

## 2017-11-09 DIAGNOSIS — I69354 Hemiplegia and hemiparesis following cerebral infarction affecting left non-dominant side: Secondary | ICD-10-CM | POA: Diagnosis not present

## 2017-11-09 DIAGNOSIS — F329 Major depressive disorder, single episode, unspecified: Secondary | ICD-10-CM | POA: Diagnosis not present

## 2017-11-10 DIAGNOSIS — I10 Essential (primary) hypertension: Secondary | ICD-10-CM | POA: Diagnosis not present

## 2017-11-10 DIAGNOSIS — F0390 Unspecified dementia without behavioral disturbance: Secondary | ICD-10-CM | POA: Diagnosis not present

## 2017-11-10 DIAGNOSIS — F329 Major depressive disorder, single episode, unspecified: Secondary | ICD-10-CM | POA: Diagnosis not present

## 2017-11-10 DIAGNOSIS — I69354 Hemiplegia and hemiparesis following cerebral infarction affecting left non-dominant side: Secondary | ICD-10-CM | POA: Diagnosis not present

## 2017-11-10 DIAGNOSIS — J449 Chronic obstructive pulmonary disease, unspecified: Secondary | ICD-10-CM | POA: Diagnosis not present

## 2017-11-10 DIAGNOSIS — E119 Type 2 diabetes mellitus without complications: Secondary | ICD-10-CM | POA: Diagnosis not present

## 2017-11-11 DIAGNOSIS — I10 Essential (primary) hypertension: Secondary | ICD-10-CM | POA: Diagnosis not present

## 2017-11-11 DIAGNOSIS — G301 Alzheimer's disease with late onset: Secondary | ICD-10-CM | POA: Diagnosis not present

## 2017-11-11 DIAGNOSIS — K219 Gastro-esophageal reflux disease without esophagitis: Secondary | ICD-10-CM | POA: Diagnosis not present

## 2017-11-11 DIAGNOSIS — I69321 Dysphasia following cerebral infarction: Secondary | ICD-10-CM | POA: Diagnosis not present

## 2017-11-11 DIAGNOSIS — E782 Mixed hyperlipidemia: Secondary | ICD-10-CM | POA: Diagnosis not present

## 2017-11-11 DIAGNOSIS — E1165 Type 2 diabetes mellitus with hyperglycemia: Secondary | ICD-10-CM | POA: Diagnosis not present

## 2017-11-11 DIAGNOSIS — Z6821 Body mass index (BMI) 21.0-21.9, adult: Secondary | ICD-10-CM | POA: Diagnosis not present

## 2017-11-13 DIAGNOSIS — I69354 Hemiplegia and hemiparesis following cerebral infarction affecting left non-dominant side: Secondary | ICD-10-CM | POA: Diagnosis not present

## 2017-11-13 DIAGNOSIS — I10 Essential (primary) hypertension: Secondary | ICD-10-CM | POA: Diagnosis not present

## 2017-11-13 DIAGNOSIS — J449 Chronic obstructive pulmonary disease, unspecified: Secondary | ICD-10-CM | POA: Diagnosis not present

## 2017-11-13 DIAGNOSIS — E119 Type 2 diabetes mellitus without complications: Secondary | ICD-10-CM | POA: Diagnosis not present

## 2017-11-13 DIAGNOSIS — F329 Major depressive disorder, single episode, unspecified: Secondary | ICD-10-CM | POA: Diagnosis not present

## 2017-11-13 DIAGNOSIS — F0390 Unspecified dementia without behavioral disturbance: Secondary | ICD-10-CM | POA: Diagnosis not present

## 2017-11-14 DIAGNOSIS — F0390 Unspecified dementia without behavioral disturbance: Secondary | ICD-10-CM | POA: Diagnosis not present

## 2017-11-14 DIAGNOSIS — E119 Type 2 diabetes mellitus without complications: Secondary | ICD-10-CM | POA: Diagnosis not present

## 2017-11-14 DIAGNOSIS — I10 Essential (primary) hypertension: Secondary | ICD-10-CM | POA: Diagnosis not present

## 2017-11-14 DIAGNOSIS — F329 Major depressive disorder, single episode, unspecified: Secondary | ICD-10-CM | POA: Diagnosis not present

## 2017-11-14 DIAGNOSIS — J449 Chronic obstructive pulmonary disease, unspecified: Secondary | ICD-10-CM | POA: Diagnosis not present

## 2017-11-14 DIAGNOSIS — I69354 Hemiplegia and hemiparesis following cerebral infarction affecting left non-dominant side: Secondary | ICD-10-CM | POA: Diagnosis not present

## 2017-11-15 DIAGNOSIS — F0390 Unspecified dementia without behavioral disturbance: Secondary | ICD-10-CM | POA: Diagnosis not present

## 2017-11-15 DIAGNOSIS — F329 Major depressive disorder, single episode, unspecified: Secondary | ICD-10-CM | POA: Diagnosis not present

## 2017-11-15 DIAGNOSIS — I69354 Hemiplegia and hemiparesis following cerebral infarction affecting left non-dominant side: Secondary | ICD-10-CM | POA: Diagnosis not present

## 2017-11-15 DIAGNOSIS — J449 Chronic obstructive pulmonary disease, unspecified: Secondary | ICD-10-CM | POA: Diagnosis not present

## 2017-11-15 DIAGNOSIS — G301 Alzheimer's disease with late onset: Secondary | ICD-10-CM | POA: Diagnosis not present

## 2017-11-15 DIAGNOSIS — E1165 Type 2 diabetes mellitus with hyperglycemia: Secondary | ICD-10-CM | POA: Diagnosis not present

## 2017-11-15 DIAGNOSIS — G459 Transient cerebral ischemic attack, unspecified: Secondary | ICD-10-CM | POA: Diagnosis not present

## 2017-11-15 DIAGNOSIS — E782 Mixed hyperlipidemia: Secondary | ICD-10-CM | POA: Diagnosis not present

## 2017-11-15 DIAGNOSIS — I10 Essential (primary) hypertension: Secondary | ICD-10-CM | POA: Diagnosis not present

## 2017-11-15 DIAGNOSIS — Z6821 Body mass index (BMI) 21.0-21.9, adult: Secondary | ICD-10-CM | POA: Diagnosis not present

## 2017-11-15 DIAGNOSIS — E119 Type 2 diabetes mellitus without complications: Secondary | ICD-10-CM | POA: Diagnosis not present

## 2017-11-16 DIAGNOSIS — F329 Major depressive disorder, single episode, unspecified: Secondary | ICD-10-CM | POA: Diagnosis not present

## 2017-11-16 DIAGNOSIS — I69354 Hemiplegia and hemiparesis following cerebral infarction affecting left non-dominant side: Secondary | ICD-10-CM | POA: Diagnosis not present

## 2017-11-16 DIAGNOSIS — E119 Type 2 diabetes mellitus without complications: Secondary | ICD-10-CM | POA: Diagnosis not present

## 2017-11-16 DIAGNOSIS — F0390 Unspecified dementia without behavioral disturbance: Secondary | ICD-10-CM | POA: Diagnosis not present

## 2017-11-16 DIAGNOSIS — J449 Chronic obstructive pulmonary disease, unspecified: Secondary | ICD-10-CM | POA: Diagnosis not present

## 2017-11-16 DIAGNOSIS — I10 Essential (primary) hypertension: Secondary | ICD-10-CM | POA: Diagnosis not present

## 2017-11-17 DIAGNOSIS — I69354 Hemiplegia and hemiparesis following cerebral infarction affecting left non-dominant side: Secondary | ICD-10-CM | POA: Diagnosis not present

## 2017-11-17 DIAGNOSIS — E119 Type 2 diabetes mellitus without complications: Secondary | ICD-10-CM | POA: Diagnosis not present

## 2017-11-17 DIAGNOSIS — F0390 Unspecified dementia without behavioral disturbance: Secondary | ICD-10-CM | POA: Diagnosis not present

## 2017-11-17 DIAGNOSIS — F329 Major depressive disorder, single episode, unspecified: Secondary | ICD-10-CM | POA: Diagnosis not present

## 2017-11-17 DIAGNOSIS — I10 Essential (primary) hypertension: Secondary | ICD-10-CM | POA: Diagnosis not present

## 2017-11-17 DIAGNOSIS — J449 Chronic obstructive pulmonary disease, unspecified: Secondary | ICD-10-CM | POA: Diagnosis not present

## 2017-11-20 DIAGNOSIS — F0390 Unspecified dementia without behavioral disturbance: Secondary | ICD-10-CM | POA: Diagnosis not present

## 2017-11-20 DIAGNOSIS — I69354 Hemiplegia and hemiparesis following cerebral infarction affecting left non-dominant side: Secondary | ICD-10-CM | POA: Diagnosis not present

## 2017-11-20 DIAGNOSIS — J449 Chronic obstructive pulmonary disease, unspecified: Secondary | ICD-10-CM | POA: Diagnosis not present

## 2017-11-20 DIAGNOSIS — F329 Major depressive disorder, single episode, unspecified: Secondary | ICD-10-CM | POA: Diagnosis not present

## 2017-11-20 DIAGNOSIS — E119 Type 2 diabetes mellitus without complications: Secondary | ICD-10-CM | POA: Diagnosis not present

## 2017-11-20 DIAGNOSIS — I10 Essential (primary) hypertension: Secondary | ICD-10-CM | POA: Diagnosis not present

## 2017-11-21 DIAGNOSIS — E119 Type 2 diabetes mellitus without complications: Secondary | ICD-10-CM | POA: Diagnosis not present

## 2017-11-21 DIAGNOSIS — I10 Essential (primary) hypertension: Secondary | ICD-10-CM | POA: Diagnosis not present

## 2017-11-21 DIAGNOSIS — J449 Chronic obstructive pulmonary disease, unspecified: Secondary | ICD-10-CM | POA: Diagnosis not present

## 2017-11-21 DIAGNOSIS — I69354 Hemiplegia and hemiparesis following cerebral infarction affecting left non-dominant side: Secondary | ICD-10-CM | POA: Diagnosis not present

## 2017-11-21 DIAGNOSIS — F329 Major depressive disorder, single episode, unspecified: Secondary | ICD-10-CM | POA: Diagnosis not present

## 2017-11-21 DIAGNOSIS — F0390 Unspecified dementia without behavioral disturbance: Secondary | ICD-10-CM | POA: Diagnosis not present

## 2017-11-23 DIAGNOSIS — F329 Major depressive disorder, single episode, unspecified: Secondary | ICD-10-CM | POA: Diagnosis not present

## 2017-11-23 DIAGNOSIS — I69354 Hemiplegia and hemiparesis following cerebral infarction affecting left non-dominant side: Secondary | ICD-10-CM | POA: Diagnosis not present

## 2017-11-23 DIAGNOSIS — E119 Type 2 diabetes mellitus without complications: Secondary | ICD-10-CM | POA: Diagnosis not present

## 2017-11-23 DIAGNOSIS — I10 Essential (primary) hypertension: Secondary | ICD-10-CM | POA: Diagnosis not present

## 2017-11-23 DIAGNOSIS — J449 Chronic obstructive pulmonary disease, unspecified: Secondary | ICD-10-CM | POA: Diagnosis not present

## 2017-11-23 DIAGNOSIS — F0390 Unspecified dementia without behavioral disturbance: Secondary | ICD-10-CM | POA: Diagnosis not present

## 2017-11-24 DIAGNOSIS — J449 Chronic obstructive pulmonary disease, unspecified: Secondary | ICD-10-CM | POA: Diagnosis not present

## 2017-11-24 DIAGNOSIS — F329 Major depressive disorder, single episode, unspecified: Secondary | ICD-10-CM | POA: Diagnosis not present

## 2017-11-24 DIAGNOSIS — F0390 Unspecified dementia without behavioral disturbance: Secondary | ICD-10-CM | POA: Diagnosis not present

## 2017-11-24 DIAGNOSIS — I69354 Hemiplegia and hemiparesis following cerebral infarction affecting left non-dominant side: Secondary | ICD-10-CM | POA: Diagnosis not present

## 2017-11-24 DIAGNOSIS — I10 Essential (primary) hypertension: Secondary | ICD-10-CM | POA: Diagnosis not present

## 2017-11-24 DIAGNOSIS — E119 Type 2 diabetes mellitus without complications: Secondary | ICD-10-CM | POA: Diagnosis not present

## 2017-11-27 DIAGNOSIS — I10 Essential (primary) hypertension: Secondary | ICD-10-CM | POA: Diagnosis not present

## 2017-11-27 DIAGNOSIS — I69354 Hemiplegia and hemiparesis following cerebral infarction affecting left non-dominant side: Secondary | ICD-10-CM | POA: Diagnosis not present

## 2017-11-27 DIAGNOSIS — J449 Chronic obstructive pulmonary disease, unspecified: Secondary | ICD-10-CM | POA: Diagnosis not present

## 2017-11-27 DIAGNOSIS — F0390 Unspecified dementia without behavioral disturbance: Secondary | ICD-10-CM | POA: Diagnosis not present

## 2017-11-27 DIAGNOSIS — E119 Type 2 diabetes mellitus without complications: Secondary | ICD-10-CM | POA: Diagnosis not present

## 2017-11-27 DIAGNOSIS — F329 Major depressive disorder, single episode, unspecified: Secondary | ICD-10-CM | POA: Diagnosis not present

## 2017-11-28 DIAGNOSIS — E119 Type 2 diabetes mellitus without complications: Secondary | ICD-10-CM | POA: Diagnosis not present

## 2017-11-28 DIAGNOSIS — I69354 Hemiplegia and hemiparesis following cerebral infarction affecting left non-dominant side: Secondary | ICD-10-CM | POA: Diagnosis not present

## 2017-11-28 DIAGNOSIS — J449 Chronic obstructive pulmonary disease, unspecified: Secondary | ICD-10-CM | POA: Diagnosis not present

## 2017-11-28 DIAGNOSIS — F0390 Unspecified dementia without behavioral disturbance: Secondary | ICD-10-CM | POA: Diagnosis not present

## 2017-11-28 DIAGNOSIS — F329 Major depressive disorder, single episode, unspecified: Secondary | ICD-10-CM | POA: Diagnosis not present

## 2017-11-28 DIAGNOSIS — I10 Essential (primary) hypertension: Secondary | ICD-10-CM | POA: Diagnosis not present

## 2017-11-29 DIAGNOSIS — F329 Major depressive disorder, single episode, unspecified: Secondary | ICD-10-CM | POA: Diagnosis not present

## 2017-11-29 DIAGNOSIS — E119 Type 2 diabetes mellitus without complications: Secondary | ICD-10-CM | POA: Diagnosis not present

## 2017-11-29 DIAGNOSIS — J449 Chronic obstructive pulmonary disease, unspecified: Secondary | ICD-10-CM | POA: Diagnosis not present

## 2017-11-29 DIAGNOSIS — I10 Essential (primary) hypertension: Secondary | ICD-10-CM | POA: Diagnosis not present

## 2017-11-29 DIAGNOSIS — I69354 Hemiplegia and hemiparesis following cerebral infarction affecting left non-dominant side: Secondary | ICD-10-CM | POA: Diagnosis not present

## 2017-11-29 DIAGNOSIS — F0390 Unspecified dementia without behavioral disturbance: Secondary | ICD-10-CM | POA: Diagnosis not present

## 2017-11-30 DIAGNOSIS — F0390 Unspecified dementia without behavioral disturbance: Secondary | ICD-10-CM | POA: Diagnosis not present

## 2017-11-30 DIAGNOSIS — J449 Chronic obstructive pulmonary disease, unspecified: Secondary | ICD-10-CM | POA: Diagnosis not present

## 2017-11-30 DIAGNOSIS — E119 Type 2 diabetes mellitus without complications: Secondary | ICD-10-CM | POA: Diagnosis not present

## 2017-11-30 DIAGNOSIS — I69354 Hemiplegia and hemiparesis following cerebral infarction affecting left non-dominant side: Secondary | ICD-10-CM | POA: Diagnosis not present

## 2017-11-30 DIAGNOSIS — F329 Major depressive disorder, single episode, unspecified: Secondary | ICD-10-CM | POA: Diagnosis not present

## 2017-11-30 DIAGNOSIS — I10 Essential (primary) hypertension: Secondary | ICD-10-CM | POA: Diagnosis not present

## 2017-12-01 DIAGNOSIS — F0390 Unspecified dementia without behavioral disturbance: Secondary | ICD-10-CM | POA: Diagnosis not present

## 2017-12-01 DIAGNOSIS — I69354 Hemiplegia and hemiparesis following cerebral infarction affecting left non-dominant side: Secondary | ICD-10-CM | POA: Diagnosis not present

## 2017-12-01 DIAGNOSIS — F329 Major depressive disorder, single episode, unspecified: Secondary | ICD-10-CM | POA: Diagnosis not present

## 2017-12-01 DIAGNOSIS — E119 Type 2 diabetes mellitus without complications: Secondary | ICD-10-CM | POA: Diagnosis not present

## 2017-12-01 DIAGNOSIS — J449 Chronic obstructive pulmonary disease, unspecified: Secondary | ICD-10-CM | POA: Diagnosis not present

## 2017-12-01 DIAGNOSIS — I10 Essential (primary) hypertension: Secondary | ICD-10-CM | POA: Diagnosis not present

## 2017-12-04 DIAGNOSIS — I69354 Hemiplegia and hemiparesis following cerebral infarction affecting left non-dominant side: Secondary | ICD-10-CM | POA: Diagnosis not present

## 2017-12-04 DIAGNOSIS — J449 Chronic obstructive pulmonary disease, unspecified: Secondary | ICD-10-CM | POA: Diagnosis not present

## 2017-12-04 DIAGNOSIS — I10 Essential (primary) hypertension: Secondary | ICD-10-CM | POA: Diagnosis not present

## 2017-12-04 DIAGNOSIS — E119 Type 2 diabetes mellitus without complications: Secondary | ICD-10-CM | POA: Diagnosis not present

## 2017-12-04 DIAGNOSIS — F329 Major depressive disorder, single episode, unspecified: Secondary | ICD-10-CM | POA: Diagnosis not present

## 2017-12-04 DIAGNOSIS — F0390 Unspecified dementia without behavioral disturbance: Secondary | ICD-10-CM | POA: Diagnosis not present

## 2017-12-05 DIAGNOSIS — I10 Essential (primary) hypertension: Secondary | ICD-10-CM | POA: Diagnosis not present

## 2017-12-05 DIAGNOSIS — F329 Major depressive disorder, single episode, unspecified: Secondary | ICD-10-CM | POA: Diagnosis not present

## 2017-12-05 DIAGNOSIS — J449 Chronic obstructive pulmonary disease, unspecified: Secondary | ICD-10-CM | POA: Diagnosis not present

## 2017-12-05 DIAGNOSIS — E119 Type 2 diabetes mellitus without complications: Secondary | ICD-10-CM | POA: Diagnosis not present

## 2017-12-05 DIAGNOSIS — I69354 Hemiplegia and hemiparesis following cerebral infarction affecting left non-dominant side: Secondary | ICD-10-CM | POA: Diagnosis not present

## 2017-12-05 DIAGNOSIS — F0390 Unspecified dementia without behavioral disturbance: Secondary | ICD-10-CM | POA: Diagnosis not present

## 2017-12-06 DIAGNOSIS — I69354 Hemiplegia and hemiparesis following cerebral infarction affecting left non-dominant side: Secondary | ICD-10-CM | POA: Diagnosis not present

## 2017-12-06 DIAGNOSIS — E782 Mixed hyperlipidemia: Secondary | ICD-10-CM | POA: Diagnosis not present

## 2017-12-06 DIAGNOSIS — F329 Major depressive disorder, single episode, unspecified: Secondary | ICD-10-CM | POA: Diagnosis not present

## 2017-12-06 DIAGNOSIS — I10 Essential (primary) hypertension: Secondary | ICD-10-CM | POA: Diagnosis not present

## 2017-12-06 DIAGNOSIS — G459 Transient cerebral ischemic attack, unspecified: Secondary | ICD-10-CM | POA: Diagnosis not present

## 2017-12-06 DIAGNOSIS — E1165 Type 2 diabetes mellitus with hyperglycemia: Secondary | ICD-10-CM | POA: Diagnosis not present

## 2017-12-06 DIAGNOSIS — F0390 Unspecified dementia without behavioral disturbance: Secondary | ICD-10-CM | POA: Diagnosis not present

## 2017-12-06 DIAGNOSIS — E119 Type 2 diabetes mellitus without complications: Secondary | ICD-10-CM | POA: Diagnosis not present

## 2017-12-06 DIAGNOSIS — J449 Chronic obstructive pulmonary disease, unspecified: Secondary | ICD-10-CM | POA: Diagnosis not present

## 2017-12-06 DIAGNOSIS — G301 Alzheimer's disease with late onset: Secondary | ICD-10-CM | POA: Diagnosis not present

## 2017-12-06 DIAGNOSIS — Z6821 Body mass index (BMI) 21.0-21.9, adult: Secondary | ICD-10-CM | POA: Diagnosis not present

## 2017-12-06 IMAGING — CT CT ABD-PELV W/ CM
2 of 5 series · 16 of 46 positions shown, 18 images · IV contrast (Isovue)
Comparison: Plain film pelvis 01/04/2016

CLINICAL DATA: Per daughter pt fell a few weeks ago, came to ER all
checked out well. Pt has since been c/o abd pain that feels like a
twisting pain. Pt having considerable wt loss , no N/V/D.

EXAM:
CT ABDOMEN AND PELVIS WITH CONTRAST
TECHNIQUE: Multidetector CT imaging of the abdomen and pelvis was performed
using the standard protocol following bolus administration of
intravenous contrast.
CONTRAST:  100mL RPP3BL-JCC IOPAMIDOL (RPP3BL-JCC) INJECTION 61%

[Series 2: axial st · axial · 0.71mm/px · z∈[+776,+1146]mm · 13 of 84 slices shown, 15 images]
[im 5/84  soft-tissue]
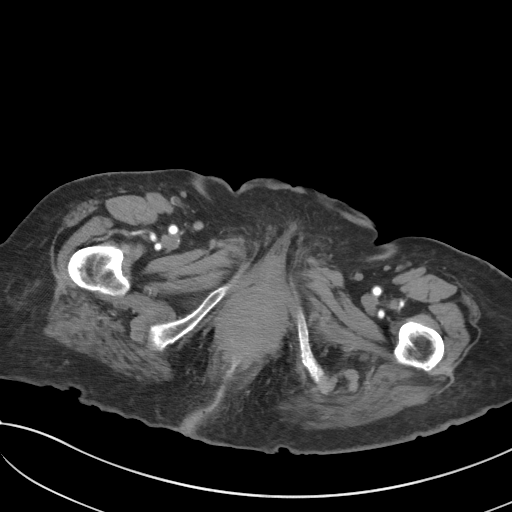
[im 5/84  bone]
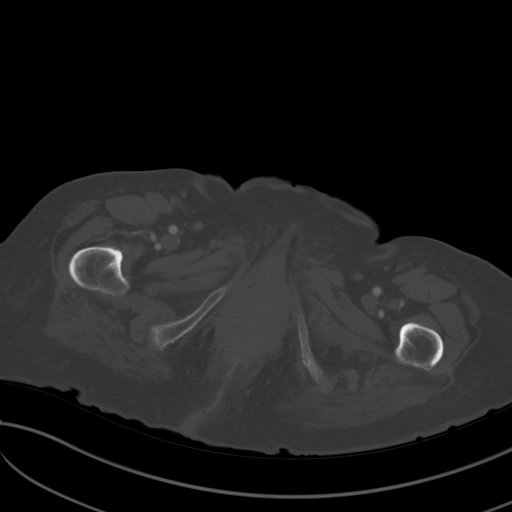
[im 13/84  soft-tissue]
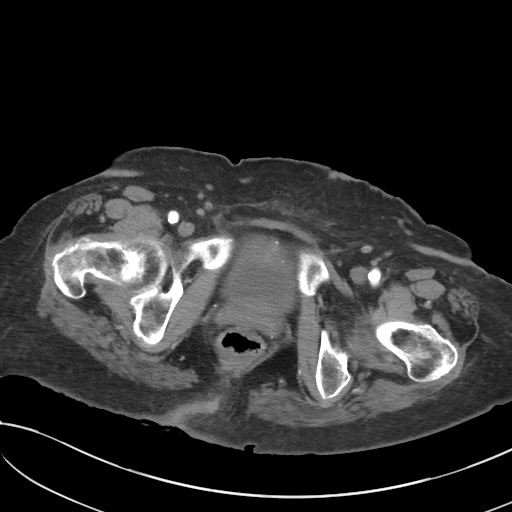
[im 17/84  soft-tissue]
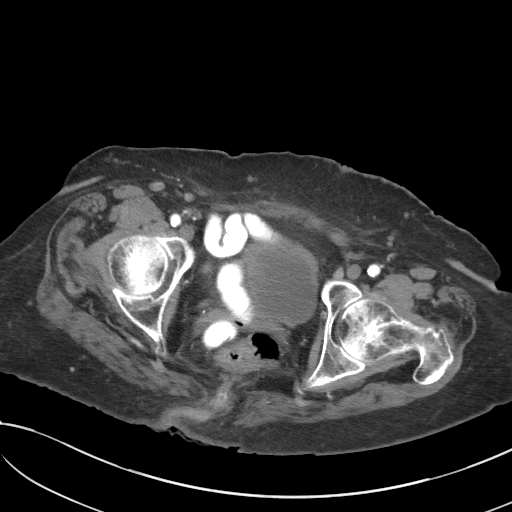
[im 25/84  soft-tissue]
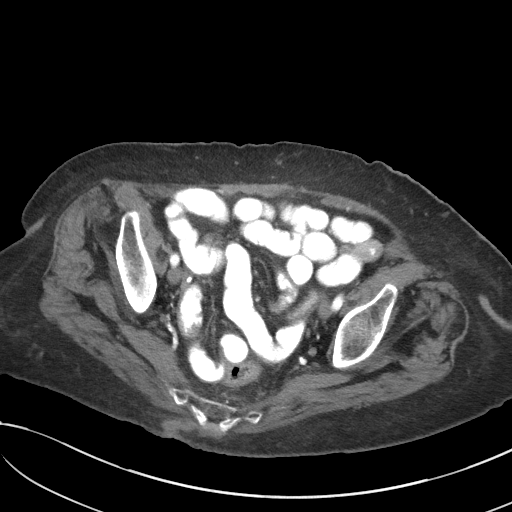
[im 30/84  soft-tissue]
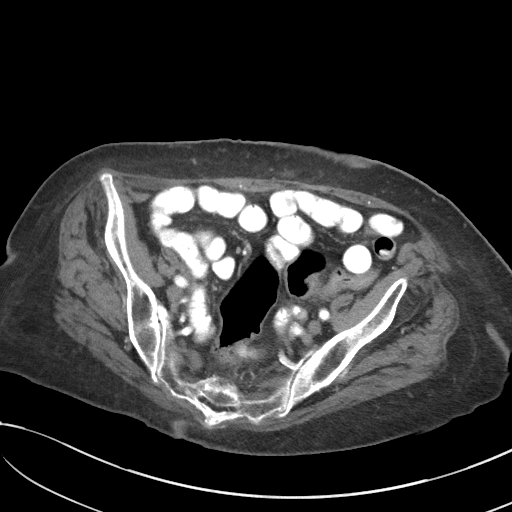
[im 38/84  soft-tissue]
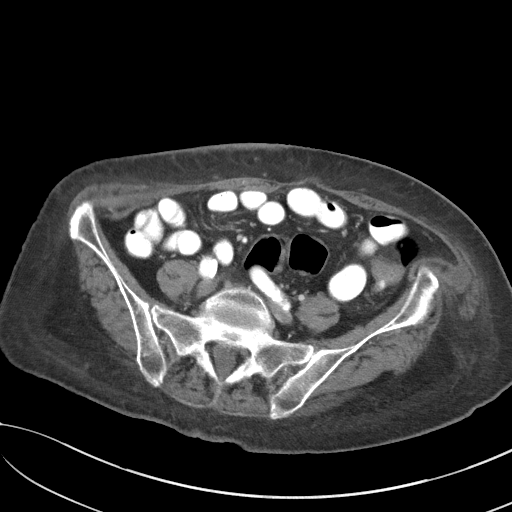
[im 42/84  soft-tissue]
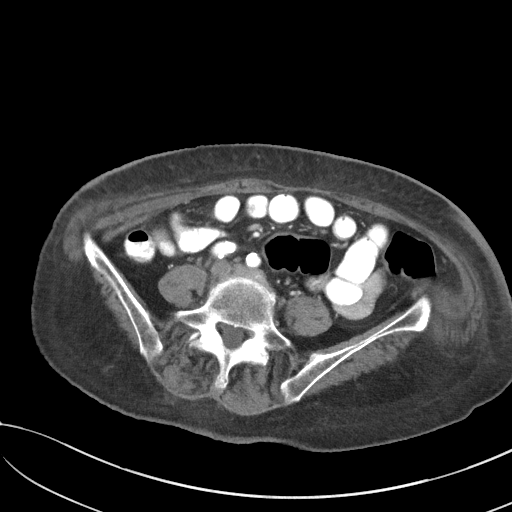
[im 46/84  soft-tissue]
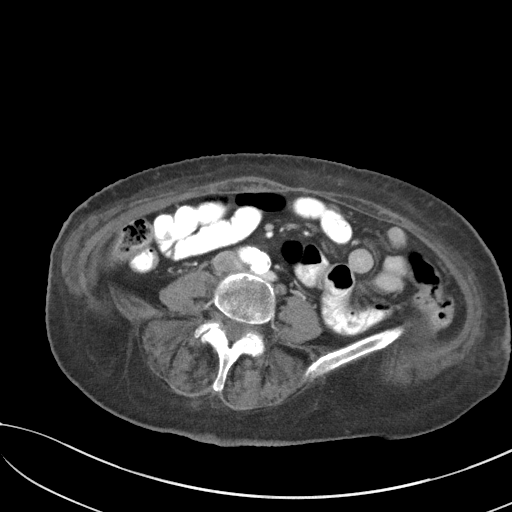
[im 54/84  soft-tissue]
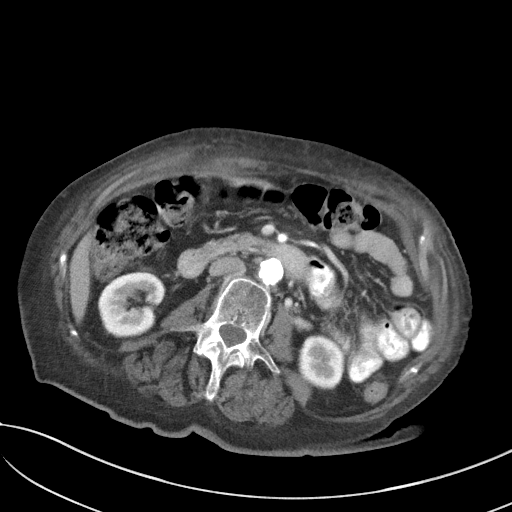
[im 54/84  bone]
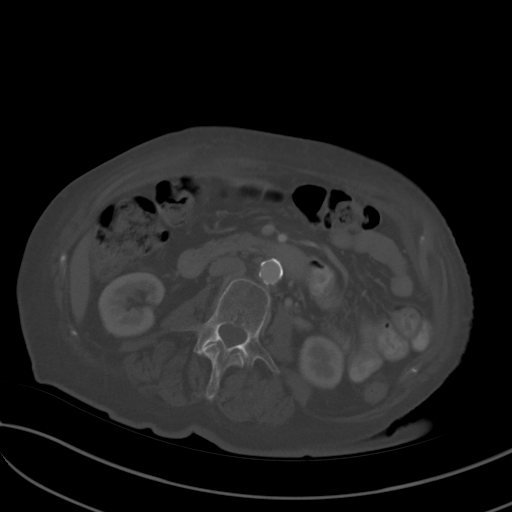
[im 59/84  soft-tissue]
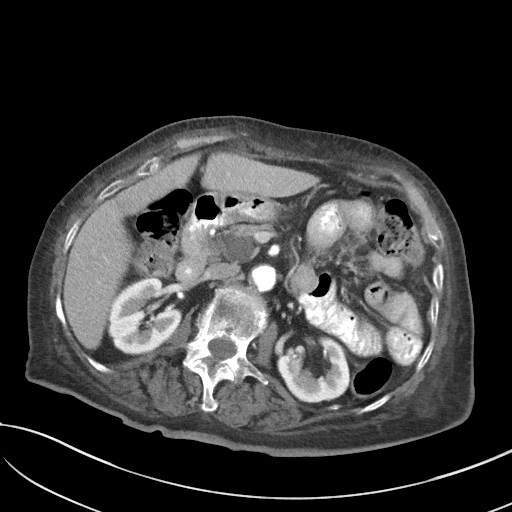
[im 67/84  soft-tissue]
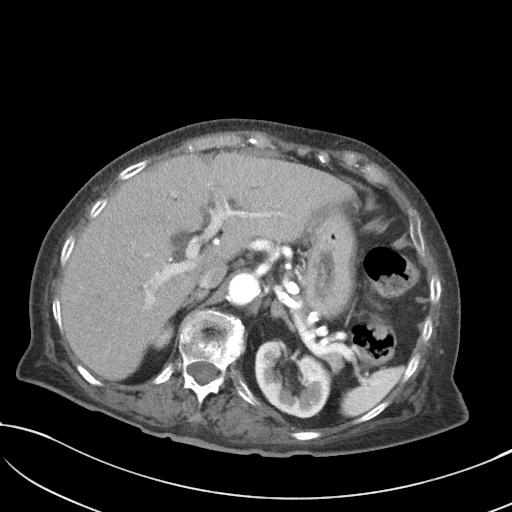
[im 71/84  soft-tissue]
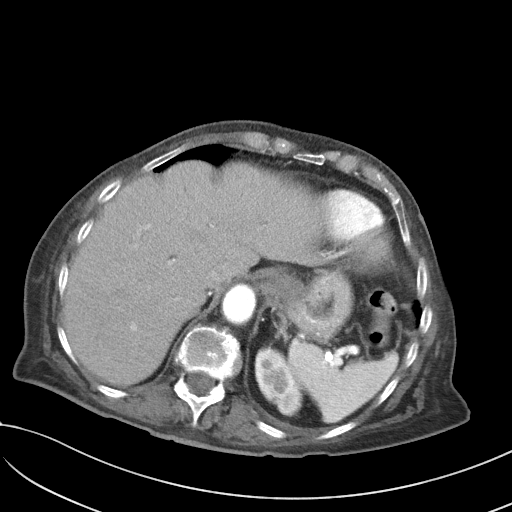
[im 79/84  soft-tissue]
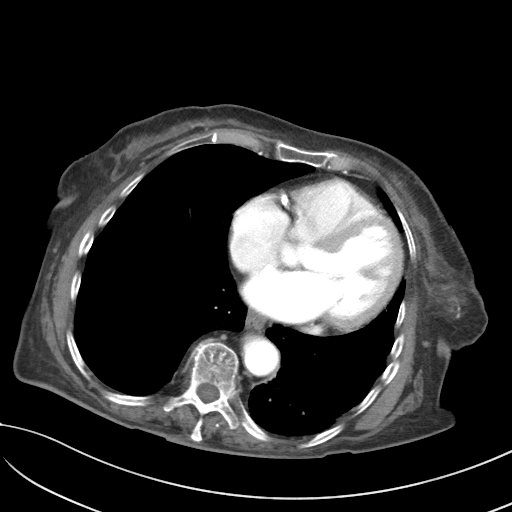

[Series 5: coronal st · coronal · 0.73mm/px · 3 of 83 slices shown]
[im 28/83  soft-tissue]
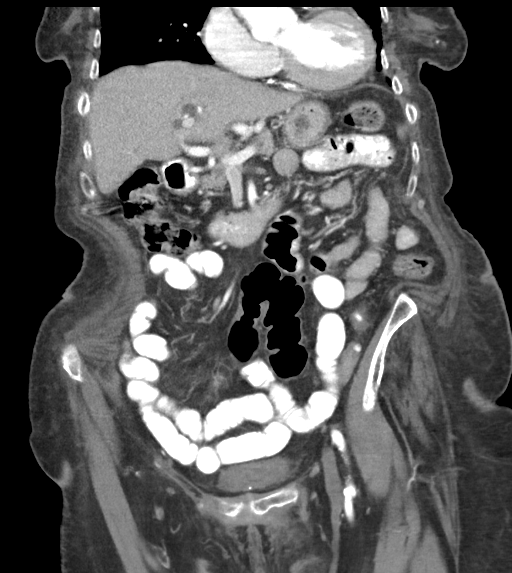
[im 37/83  soft-tissue]
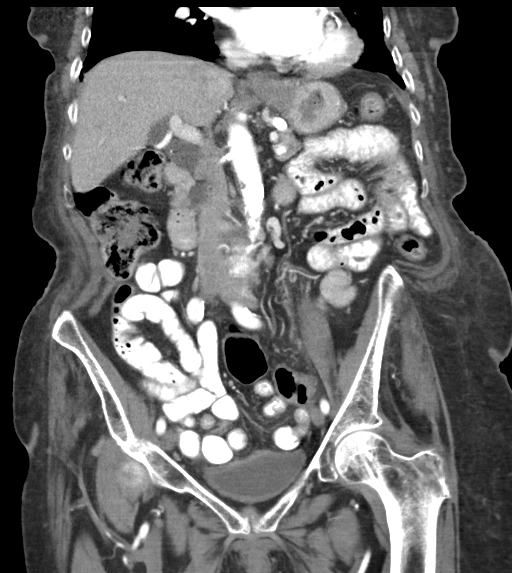
[im 46/83  soft-tissue]
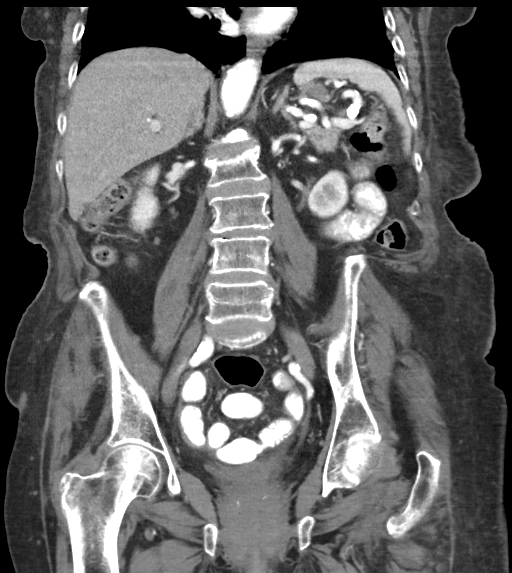

[16 of 46 positions shown; findings below may reference images not displayed]

FINDINGS: Lower chest: Lung bases are clear.

Hepatobiliary: No focal hepatic lesion. Postcholecystectomy. The
common bile duct is extremely dilated at 23 mm. There is mild
intrahepatic duct dilatation which is much less prominent than the
common bile duct dilatation. The common bile duct dilatation extends
to the ampulla. No obstructing lesion identified

Pancreas: No pancreatic duct dilatation.  The pancreas is atrophic.

Spleen: Normal spleen

Adrenals/urinary tract: Adrenal glands and kidneys are normal. The
ureters and bladder normal.

Stomach/Bowel: Stomach, small bowel, appendix, and cecum are normal.
The colon and rectosigmoid colon are normal.

Vascular/Lymphatic: Abdominal aorta is normal caliber with
atherosclerotic calcification. There is no retroperitoneal or
periportal lymphadenopathy. No pelvic lymphadenopathy.

Reproductive: Post hysterectomy.  No adnexal abnormality

Other: No free fluid.

Musculoskeletal: A chronic superior endplate compression fracture at
L2. No acute findings. No pelvic fracture or
IMPRESSION: 1. No acute findings in the abdomen pelvis.
2. Severe dilatation of the common bile duct and mild intrahepatic
duct dilatation suggests chronic biliary ductal dilatation related
to patient's age and prior cholecystectomy. Recommend correlation
with bilirubin level.
3. Atrophic pancreas without mass lesion or duct dilatation.
4. Mild colonic diverticulosis without diverticulitis.
5. No evidence of pelvic fracture

## 2017-12-07 DIAGNOSIS — I10 Essential (primary) hypertension: Secondary | ICD-10-CM | POA: Diagnosis not present

## 2017-12-07 DIAGNOSIS — I69354 Hemiplegia and hemiparesis following cerebral infarction affecting left non-dominant side: Secondary | ICD-10-CM | POA: Diagnosis not present

## 2017-12-07 DIAGNOSIS — F0390 Unspecified dementia without behavioral disturbance: Secondary | ICD-10-CM | POA: Diagnosis not present

## 2017-12-07 DIAGNOSIS — F329 Major depressive disorder, single episode, unspecified: Secondary | ICD-10-CM | POA: Diagnosis not present

## 2017-12-07 DIAGNOSIS — E119 Type 2 diabetes mellitus without complications: Secondary | ICD-10-CM | POA: Diagnosis not present

## 2017-12-07 DIAGNOSIS — J449 Chronic obstructive pulmonary disease, unspecified: Secondary | ICD-10-CM | POA: Diagnosis not present

## 2017-12-08 DIAGNOSIS — F329 Major depressive disorder, single episode, unspecified: Secondary | ICD-10-CM | POA: Diagnosis not present

## 2017-12-08 DIAGNOSIS — F0390 Unspecified dementia without behavioral disturbance: Secondary | ICD-10-CM | POA: Diagnosis not present

## 2017-12-08 DIAGNOSIS — I10 Essential (primary) hypertension: Secondary | ICD-10-CM | POA: Diagnosis not present

## 2017-12-08 DIAGNOSIS — E119 Type 2 diabetes mellitus without complications: Secondary | ICD-10-CM | POA: Diagnosis not present

## 2017-12-08 DIAGNOSIS — I69354 Hemiplegia and hemiparesis following cerebral infarction affecting left non-dominant side: Secondary | ICD-10-CM | POA: Diagnosis not present

## 2017-12-08 DIAGNOSIS — J449 Chronic obstructive pulmonary disease, unspecified: Secondary | ICD-10-CM | POA: Diagnosis not present

## 2017-12-11 DIAGNOSIS — J449 Chronic obstructive pulmonary disease, unspecified: Secondary | ICD-10-CM | POA: Diagnosis not present

## 2017-12-11 DIAGNOSIS — F0390 Unspecified dementia without behavioral disturbance: Secondary | ICD-10-CM | POA: Diagnosis not present

## 2017-12-11 DIAGNOSIS — I69354 Hemiplegia and hemiparesis following cerebral infarction affecting left non-dominant side: Secondary | ICD-10-CM | POA: Diagnosis not present

## 2017-12-11 DIAGNOSIS — F329 Major depressive disorder, single episode, unspecified: Secondary | ICD-10-CM | POA: Diagnosis not present

## 2017-12-11 DIAGNOSIS — E119 Type 2 diabetes mellitus without complications: Secondary | ICD-10-CM | POA: Diagnosis not present

## 2017-12-11 DIAGNOSIS — I10 Essential (primary) hypertension: Secondary | ICD-10-CM | POA: Diagnosis not present

## 2017-12-12 DIAGNOSIS — I10 Essential (primary) hypertension: Secondary | ICD-10-CM | POA: Diagnosis not present

## 2017-12-12 DIAGNOSIS — J449 Chronic obstructive pulmonary disease, unspecified: Secondary | ICD-10-CM | POA: Diagnosis not present

## 2017-12-12 DIAGNOSIS — F329 Major depressive disorder, single episode, unspecified: Secondary | ICD-10-CM | POA: Diagnosis not present

## 2017-12-12 DIAGNOSIS — I69354 Hemiplegia and hemiparesis following cerebral infarction affecting left non-dominant side: Secondary | ICD-10-CM | POA: Diagnosis not present

## 2017-12-12 DIAGNOSIS — E119 Type 2 diabetes mellitus without complications: Secondary | ICD-10-CM | POA: Diagnosis not present

## 2017-12-12 DIAGNOSIS — F0390 Unspecified dementia without behavioral disturbance: Secondary | ICD-10-CM | POA: Diagnosis not present

## 2017-12-25 ENCOUNTER — Other Ambulatory Visit: Payer: Self-pay | Admitting: Family Medicine

## 2018-02-05 DIAGNOSIS — Z0001 Encounter for general adult medical examination with abnormal findings: Secondary | ICD-10-CM | POA: Diagnosis not present

## 2018-02-05 DIAGNOSIS — G301 Alzheimer's disease with late onset: Secondary | ICD-10-CM | POA: Diagnosis not present

## 2018-02-05 DIAGNOSIS — K219 Gastro-esophageal reflux disease without esophagitis: Secondary | ICD-10-CM | POA: Diagnosis not present

## 2018-02-05 DIAGNOSIS — I10 Essential (primary) hypertension: Secondary | ICD-10-CM | POA: Diagnosis not present

## 2018-02-05 DIAGNOSIS — E1165 Type 2 diabetes mellitus with hyperglycemia: Secondary | ICD-10-CM | POA: Diagnosis not present

## 2018-02-05 DIAGNOSIS — E441 Mild protein-calorie malnutrition: Secondary | ICD-10-CM | POA: Diagnosis not present

## 2018-02-05 DIAGNOSIS — E782 Mixed hyperlipidemia: Secondary | ICD-10-CM | POA: Diagnosis not present

## 2018-02-05 DIAGNOSIS — I69321 Dysphasia following cerebral infarction: Secondary | ICD-10-CM | POA: Diagnosis not present

## 2018-02-05 DIAGNOSIS — Z6821 Body mass index (BMI) 21.0-21.9, adult: Secondary | ICD-10-CM | POA: Diagnosis not present

## 2018-02-06 ENCOUNTER — Other Ambulatory Visit: Payer: Self-pay | Admitting: Family Medicine

## 2018-02-13 ENCOUNTER — Other Ambulatory Visit: Payer: Self-pay | Admitting: Family Medicine

## 2018-03-23 ENCOUNTER — Other Ambulatory Visit: Payer: Self-pay | Admitting: Family Medicine

## 2018-04-18 DIAGNOSIS — E782 Mixed hyperlipidemia: Secondary | ICD-10-CM | POA: Diagnosis not present

## 2018-04-18 DIAGNOSIS — K219 Gastro-esophageal reflux disease without esophagitis: Secondary | ICD-10-CM | POA: Diagnosis not present

## 2018-04-18 DIAGNOSIS — I10 Essential (primary) hypertension: Secondary | ICD-10-CM | POA: Diagnosis not present

## 2018-05-02 DIAGNOSIS — F0280 Dementia in other diseases classified elsewhere without behavioral disturbance: Secondary | ICD-10-CM | POA: Diagnosis not present

## 2018-05-02 DIAGNOSIS — I69321 Dysphasia following cerebral infarction: Secondary | ICD-10-CM | POA: Diagnosis not present

## 2018-05-02 DIAGNOSIS — K219 Gastro-esophageal reflux disease without esophagitis: Secondary | ICD-10-CM | POA: Diagnosis not present

## 2018-05-02 DIAGNOSIS — E119 Type 2 diabetes mellitus without complications: Secondary | ICD-10-CM | POA: Diagnosis not present

## 2018-05-02 DIAGNOSIS — G301 Alzheimer's disease with late onset: Secondary | ICD-10-CM | POA: Diagnosis not present

## 2018-05-02 DIAGNOSIS — I10 Essential (primary) hypertension: Secondary | ICD-10-CM | POA: Diagnosis not present

## 2018-05-02 DIAGNOSIS — J449 Chronic obstructive pulmonary disease, unspecified: Secondary | ICD-10-CM | POA: Diagnosis not present

## 2018-05-02 DIAGNOSIS — E782 Mixed hyperlipidemia: Secondary | ICD-10-CM | POA: Diagnosis not present

## 2018-05-02 DIAGNOSIS — Z7902 Long term (current) use of antithrombotics/antiplatelets: Secondary | ICD-10-CM | POA: Diagnosis not present

## 2018-05-04 DIAGNOSIS — E119 Type 2 diabetes mellitus without complications: Secondary | ICD-10-CM | POA: Diagnosis not present

## 2018-05-04 DIAGNOSIS — I69321 Dysphasia following cerebral infarction: Secondary | ICD-10-CM | POA: Diagnosis not present

## 2018-05-04 DIAGNOSIS — G301 Alzheimer's disease with late onset: Secondary | ICD-10-CM | POA: Diagnosis not present

## 2018-05-04 DIAGNOSIS — Z7902 Long term (current) use of antithrombotics/antiplatelets: Secondary | ICD-10-CM | POA: Diagnosis not present

## 2018-05-04 DIAGNOSIS — E782 Mixed hyperlipidemia: Secondary | ICD-10-CM | POA: Diagnosis not present

## 2018-05-04 DIAGNOSIS — K219 Gastro-esophageal reflux disease without esophagitis: Secondary | ICD-10-CM | POA: Diagnosis not present

## 2018-05-04 DIAGNOSIS — J449 Chronic obstructive pulmonary disease, unspecified: Secondary | ICD-10-CM | POA: Diagnosis not present

## 2018-05-04 DIAGNOSIS — F0280 Dementia in other diseases classified elsewhere without behavioral disturbance: Secondary | ICD-10-CM | POA: Diagnosis not present

## 2018-05-04 DIAGNOSIS — I10 Essential (primary) hypertension: Secondary | ICD-10-CM | POA: Diagnosis not present

## 2018-05-08 DIAGNOSIS — Z7902 Long term (current) use of antithrombotics/antiplatelets: Secondary | ICD-10-CM | POA: Diagnosis not present

## 2018-05-08 DIAGNOSIS — I69321 Dysphasia following cerebral infarction: Secondary | ICD-10-CM | POA: Diagnosis not present

## 2018-05-08 DIAGNOSIS — K219 Gastro-esophageal reflux disease without esophagitis: Secondary | ICD-10-CM | POA: Diagnosis not present

## 2018-05-08 DIAGNOSIS — G301 Alzheimer's disease with late onset: Secondary | ICD-10-CM | POA: Diagnosis not present

## 2018-05-08 DIAGNOSIS — J449 Chronic obstructive pulmonary disease, unspecified: Secondary | ICD-10-CM | POA: Diagnosis not present

## 2018-05-08 DIAGNOSIS — E782 Mixed hyperlipidemia: Secondary | ICD-10-CM | POA: Diagnosis not present

## 2018-05-08 DIAGNOSIS — F0280 Dementia in other diseases classified elsewhere without behavioral disturbance: Secondary | ICD-10-CM | POA: Diagnosis not present

## 2018-05-08 DIAGNOSIS — E119 Type 2 diabetes mellitus without complications: Secondary | ICD-10-CM | POA: Diagnosis not present

## 2018-05-08 DIAGNOSIS — I10 Essential (primary) hypertension: Secondary | ICD-10-CM | POA: Diagnosis not present

## 2018-05-09 DIAGNOSIS — E119 Type 2 diabetes mellitus without complications: Secondary | ICD-10-CM | POA: Diagnosis not present

## 2018-05-09 DIAGNOSIS — E782 Mixed hyperlipidemia: Secondary | ICD-10-CM | POA: Diagnosis not present

## 2018-05-09 DIAGNOSIS — I10 Essential (primary) hypertension: Secondary | ICD-10-CM | POA: Diagnosis not present

## 2018-05-09 DIAGNOSIS — K219 Gastro-esophageal reflux disease without esophagitis: Secondary | ICD-10-CM | POA: Diagnosis not present

## 2018-05-09 DIAGNOSIS — F0280 Dementia in other diseases classified elsewhere without behavioral disturbance: Secondary | ICD-10-CM | POA: Diagnosis not present

## 2018-05-09 DIAGNOSIS — Z7902 Long term (current) use of antithrombotics/antiplatelets: Secondary | ICD-10-CM | POA: Diagnosis not present

## 2018-05-09 DIAGNOSIS — J449 Chronic obstructive pulmonary disease, unspecified: Secondary | ICD-10-CM | POA: Diagnosis not present

## 2018-05-09 DIAGNOSIS — G301 Alzheimer's disease with late onset: Secondary | ICD-10-CM | POA: Diagnosis not present

## 2018-05-09 DIAGNOSIS — I69321 Dysphasia following cerebral infarction: Secondary | ICD-10-CM | POA: Diagnosis not present

## 2018-05-11 DIAGNOSIS — E782 Mixed hyperlipidemia: Secondary | ICD-10-CM | POA: Diagnosis not present

## 2018-05-11 DIAGNOSIS — J449 Chronic obstructive pulmonary disease, unspecified: Secondary | ICD-10-CM | POA: Diagnosis not present

## 2018-05-11 DIAGNOSIS — I10 Essential (primary) hypertension: Secondary | ICD-10-CM | POA: Diagnosis not present

## 2018-05-11 DIAGNOSIS — E119 Type 2 diabetes mellitus without complications: Secondary | ICD-10-CM | POA: Diagnosis not present

## 2018-05-11 DIAGNOSIS — F0280 Dementia in other diseases classified elsewhere without behavioral disturbance: Secondary | ICD-10-CM | POA: Diagnosis not present

## 2018-05-11 DIAGNOSIS — Z7902 Long term (current) use of antithrombotics/antiplatelets: Secondary | ICD-10-CM | POA: Diagnosis not present

## 2018-05-11 DIAGNOSIS — K219 Gastro-esophageal reflux disease without esophagitis: Secondary | ICD-10-CM | POA: Diagnosis not present

## 2018-05-11 DIAGNOSIS — I69321 Dysphasia following cerebral infarction: Secondary | ICD-10-CM | POA: Diagnosis not present

## 2018-05-11 DIAGNOSIS — G301 Alzheimer's disease with late onset: Secondary | ICD-10-CM | POA: Diagnosis not present

## 2018-05-14 DIAGNOSIS — K219 Gastro-esophageal reflux disease without esophagitis: Secondary | ICD-10-CM | POA: Diagnosis not present

## 2018-05-14 DIAGNOSIS — G301 Alzheimer's disease with late onset: Secondary | ICD-10-CM | POA: Diagnosis not present

## 2018-05-14 DIAGNOSIS — I10 Essential (primary) hypertension: Secondary | ICD-10-CM | POA: Diagnosis not present

## 2018-05-14 DIAGNOSIS — I69321 Dysphasia following cerebral infarction: Secondary | ICD-10-CM | POA: Diagnosis not present

## 2018-05-14 DIAGNOSIS — J449 Chronic obstructive pulmonary disease, unspecified: Secondary | ICD-10-CM | POA: Diagnosis not present

## 2018-05-14 DIAGNOSIS — Z7902 Long term (current) use of antithrombotics/antiplatelets: Secondary | ICD-10-CM | POA: Diagnosis not present

## 2018-05-14 DIAGNOSIS — E782 Mixed hyperlipidemia: Secondary | ICD-10-CM | POA: Diagnosis not present

## 2018-05-14 DIAGNOSIS — E119 Type 2 diabetes mellitus without complications: Secondary | ICD-10-CM | POA: Diagnosis not present

## 2018-05-14 DIAGNOSIS — F0280 Dementia in other diseases classified elsewhere without behavioral disturbance: Secondary | ICD-10-CM | POA: Diagnosis not present

## 2018-05-15 DIAGNOSIS — J449 Chronic obstructive pulmonary disease, unspecified: Secondary | ICD-10-CM | POA: Diagnosis not present

## 2018-05-15 DIAGNOSIS — G301 Alzheimer's disease with late onset: Secondary | ICD-10-CM | POA: Diagnosis not present

## 2018-05-15 DIAGNOSIS — F0280 Dementia in other diseases classified elsewhere without behavioral disturbance: Secondary | ICD-10-CM | POA: Diagnosis not present

## 2018-05-15 DIAGNOSIS — I10 Essential (primary) hypertension: Secondary | ICD-10-CM | POA: Diagnosis not present

## 2018-05-15 DIAGNOSIS — K219 Gastro-esophageal reflux disease without esophagitis: Secondary | ICD-10-CM | POA: Diagnosis not present

## 2018-05-15 DIAGNOSIS — Z7902 Long term (current) use of antithrombotics/antiplatelets: Secondary | ICD-10-CM | POA: Diagnosis not present

## 2018-05-15 DIAGNOSIS — I69321 Dysphasia following cerebral infarction: Secondary | ICD-10-CM | POA: Diagnosis not present

## 2018-05-15 DIAGNOSIS — E782 Mixed hyperlipidemia: Secondary | ICD-10-CM | POA: Diagnosis not present

## 2018-05-15 DIAGNOSIS — E119 Type 2 diabetes mellitus without complications: Secondary | ICD-10-CM | POA: Diagnosis not present

## 2018-05-17 DIAGNOSIS — E119 Type 2 diabetes mellitus without complications: Secondary | ICD-10-CM | POA: Diagnosis not present

## 2018-05-17 DIAGNOSIS — K219 Gastro-esophageal reflux disease without esophagitis: Secondary | ICD-10-CM | POA: Diagnosis not present

## 2018-05-17 DIAGNOSIS — J449 Chronic obstructive pulmonary disease, unspecified: Secondary | ICD-10-CM | POA: Diagnosis not present

## 2018-05-17 DIAGNOSIS — G301 Alzheimer's disease with late onset: Secondary | ICD-10-CM | POA: Diagnosis not present

## 2018-05-17 DIAGNOSIS — E782 Mixed hyperlipidemia: Secondary | ICD-10-CM | POA: Diagnosis not present

## 2018-05-17 DIAGNOSIS — I69321 Dysphasia following cerebral infarction: Secondary | ICD-10-CM | POA: Diagnosis not present

## 2018-05-17 DIAGNOSIS — F0280 Dementia in other diseases classified elsewhere without behavioral disturbance: Secondary | ICD-10-CM | POA: Diagnosis not present

## 2018-05-17 DIAGNOSIS — I10 Essential (primary) hypertension: Secondary | ICD-10-CM | POA: Diagnosis not present

## 2018-05-17 DIAGNOSIS — Z7902 Long term (current) use of antithrombotics/antiplatelets: Secondary | ICD-10-CM | POA: Diagnosis not present

## 2018-05-18 DIAGNOSIS — G301 Alzheimer's disease with late onset: Secondary | ICD-10-CM | POA: Diagnosis not present

## 2018-05-18 DIAGNOSIS — Z7902 Long term (current) use of antithrombotics/antiplatelets: Secondary | ICD-10-CM | POA: Diagnosis not present

## 2018-05-18 DIAGNOSIS — K219 Gastro-esophageal reflux disease without esophagitis: Secondary | ICD-10-CM | POA: Diagnosis not present

## 2018-05-18 DIAGNOSIS — I10 Essential (primary) hypertension: Secondary | ICD-10-CM | POA: Diagnosis not present

## 2018-05-18 DIAGNOSIS — J449 Chronic obstructive pulmonary disease, unspecified: Secondary | ICD-10-CM | POA: Diagnosis not present

## 2018-05-18 DIAGNOSIS — F0280 Dementia in other diseases classified elsewhere without behavioral disturbance: Secondary | ICD-10-CM | POA: Diagnosis not present

## 2018-05-18 DIAGNOSIS — I69321 Dysphasia following cerebral infarction: Secondary | ICD-10-CM | POA: Diagnosis not present

## 2018-05-18 DIAGNOSIS — E119 Type 2 diabetes mellitus without complications: Secondary | ICD-10-CM | POA: Diagnosis not present

## 2018-05-18 DIAGNOSIS — E782 Mixed hyperlipidemia: Secondary | ICD-10-CM | POA: Diagnosis not present

## 2018-05-21 DIAGNOSIS — Z7902 Long term (current) use of antithrombotics/antiplatelets: Secondary | ICD-10-CM | POA: Diagnosis not present

## 2018-05-21 DIAGNOSIS — F0280 Dementia in other diseases classified elsewhere without behavioral disturbance: Secondary | ICD-10-CM | POA: Diagnosis not present

## 2018-05-21 DIAGNOSIS — E119 Type 2 diabetes mellitus without complications: Secondary | ICD-10-CM | POA: Diagnosis not present

## 2018-05-21 DIAGNOSIS — I69321 Dysphasia following cerebral infarction: Secondary | ICD-10-CM | POA: Diagnosis not present

## 2018-05-21 DIAGNOSIS — E782 Mixed hyperlipidemia: Secondary | ICD-10-CM | POA: Diagnosis not present

## 2018-05-21 DIAGNOSIS — I10 Essential (primary) hypertension: Secondary | ICD-10-CM | POA: Diagnosis not present

## 2018-05-21 DIAGNOSIS — K219 Gastro-esophageal reflux disease without esophagitis: Secondary | ICD-10-CM | POA: Diagnosis not present

## 2018-05-21 DIAGNOSIS — G301 Alzheimer's disease with late onset: Secondary | ICD-10-CM | POA: Diagnosis not present

## 2018-05-21 DIAGNOSIS — J449 Chronic obstructive pulmonary disease, unspecified: Secondary | ICD-10-CM | POA: Diagnosis not present

## 2018-05-23 DIAGNOSIS — I10 Essential (primary) hypertension: Secondary | ICD-10-CM | POA: Diagnosis not present

## 2018-05-23 DIAGNOSIS — F0280 Dementia in other diseases classified elsewhere without behavioral disturbance: Secondary | ICD-10-CM | POA: Diagnosis not present

## 2018-05-23 DIAGNOSIS — Z7902 Long term (current) use of antithrombotics/antiplatelets: Secondary | ICD-10-CM | POA: Diagnosis not present

## 2018-05-23 DIAGNOSIS — K219 Gastro-esophageal reflux disease without esophagitis: Secondary | ICD-10-CM | POA: Diagnosis not present

## 2018-05-23 DIAGNOSIS — E119 Type 2 diabetes mellitus without complications: Secondary | ICD-10-CM | POA: Diagnosis not present

## 2018-05-23 DIAGNOSIS — J449 Chronic obstructive pulmonary disease, unspecified: Secondary | ICD-10-CM | POA: Diagnosis not present

## 2018-05-23 DIAGNOSIS — I69321 Dysphasia following cerebral infarction: Secondary | ICD-10-CM | POA: Diagnosis not present

## 2018-05-23 DIAGNOSIS — G301 Alzheimer's disease with late onset: Secondary | ICD-10-CM | POA: Diagnosis not present

## 2018-05-23 DIAGNOSIS — E782 Mixed hyperlipidemia: Secondary | ICD-10-CM | POA: Diagnosis not present

## 2018-05-24 DIAGNOSIS — Z7902 Long term (current) use of antithrombotics/antiplatelets: Secondary | ICD-10-CM | POA: Diagnosis not present

## 2018-05-24 DIAGNOSIS — J449 Chronic obstructive pulmonary disease, unspecified: Secondary | ICD-10-CM | POA: Diagnosis not present

## 2018-05-24 DIAGNOSIS — F0280 Dementia in other diseases classified elsewhere without behavioral disturbance: Secondary | ICD-10-CM | POA: Diagnosis not present

## 2018-05-24 DIAGNOSIS — E119 Type 2 diabetes mellitus without complications: Secondary | ICD-10-CM | POA: Diagnosis not present

## 2018-05-24 DIAGNOSIS — K219 Gastro-esophageal reflux disease without esophagitis: Secondary | ICD-10-CM | POA: Diagnosis not present

## 2018-05-24 DIAGNOSIS — G301 Alzheimer's disease with late onset: Secondary | ICD-10-CM | POA: Diagnosis not present

## 2018-05-24 DIAGNOSIS — I10 Essential (primary) hypertension: Secondary | ICD-10-CM | POA: Diagnosis not present

## 2018-05-24 DIAGNOSIS — I69321 Dysphasia following cerebral infarction: Secondary | ICD-10-CM | POA: Diagnosis not present

## 2018-05-24 DIAGNOSIS — E782 Mixed hyperlipidemia: Secondary | ICD-10-CM | POA: Diagnosis not present

## 2018-05-28 DIAGNOSIS — I10 Essential (primary) hypertension: Secondary | ICD-10-CM | POA: Diagnosis not present

## 2018-05-28 DIAGNOSIS — E119 Type 2 diabetes mellitus without complications: Secondary | ICD-10-CM | POA: Diagnosis not present

## 2018-05-28 DIAGNOSIS — I69321 Dysphasia following cerebral infarction: Secondary | ICD-10-CM | POA: Diagnosis not present

## 2018-05-28 DIAGNOSIS — K219 Gastro-esophageal reflux disease without esophagitis: Secondary | ICD-10-CM | POA: Diagnosis not present

## 2018-05-28 DIAGNOSIS — Z7902 Long term (current) use of antithrombotics/antiplatelets: Secondary | ICD-10-CM | POA: Diagnosis not present

## 2018-05-28 DIAGNOSIS — J449 Chronic obstructive pulmonary disease, unspecified: Secondary | ICD-10-CM | POA: Diagnosis not present

## 2018-05-28 DIAGNOSIS — G301 Alzheimer's disease with late onset: Secondary | ICD-10-CM | POA: Diagnosis not present

## 2018-05-28 DIAGNOSIS — F0280 Dementia in other diseases classified elsewhere without behavioral disturbance: Secondary | ICD-10-CM | POA: Diagnosis not present

## 2018-05-28 DIAGNOSIS — E782 Mixed hyperlipidemia: Secondary | ICD-10-CM | POA: Diagnosis not present

## 2018-05-29 DIAGNOSIS — J449 Chronic obstructive pulmonary disease, unspecified: Secondary | ICD-10-CM | POA: Diagnosis not present

## 2018-05-29 DIAGNOSIS — K219 Gastro-esophageal reflux disease without esophagitis: Secondary | ICD-10-CM | POA: Diagnosis not present

## 2018-05-29 DIAGNOSIS — G301 Alzheimer's disease with late onset: Secondary | ICD-10-CM | POA: Diagnosis not present

## 2018-05-29 DIAGNOSIS — Z7902 Long term (current) use of antithrombotics/antiplatelets: Secondary | ICD-10-CM | POA: Diagnosis not present

## 2018-05-29 DIAGNOSIS — E782 Mixed hyperlipidemia: Secondary | ICD-10-CM | POA: Diagnosis not present

## 2018-05-29 DIAGNOSIS — I10 Essential (primary) hypertension: Secondary | ICD-10-CM | POA: Diagnosis not present

## 2018-05-29 DIAGNOSIS — I69321 Dysphasia following cerebral infarction: Secondary | ICD-10-CM | POA: Diagnosis not present

## 2018-05-29 DIAGNOSIS — F0280 Dementia in other diseases classified elsewhere without behavioral disturbance: Secondary | ICD-10-CM | POA: Diagnosis not present

## 2018-05-29 DIAGNOSIS — E119 Type 2 diabetes mellitus without complications: Secondary | ICD-10-CM | POA: Diagnosis not present

## 2018-05-30 DIAGNOSIS — J449 Chronic obstructive pulmonary disease, unspecified: Secondary | ICD-10-CM | POA: Diagnosis not present

## 2018-05-30 DIAGNOSIS — E119 Type 2 diabetes mellitus without complications: Secondary | ICD-10-CM | POA: Diagnosis not present

## 2018-05-30 DIAGNOSIS — K219 Gastro-esophageal reflux disease without esophagitis: Secondary | ICD-10-CM | POA: Diagnosis not present

## 2018-05-30 DIAGNOSIS — F0280 Dementia in other diseases classified elsewhere without behavioral disturbance: Secondary | ICD-10-CM | POA: Diagnosis not present

## 2018-05-30 DIAGNOSIS — I69321 Dysphasia following cerebral infarction: Secondary | ICD-10-CM | POA: Diagnosis not present

## 2018-05-30 DIAGNOSIS — Z7902 Long term (current) use of antithrombotics/antiplatelets: Secondary | ICD-10-CM | POA: Diagnosis not present

## 2018-05-30 DIAGNOSIS — E782 Mixed hyperlipidemia: Secondary | ICD-10-CM | POA: Diagnosis not present

## 2018-05-30 DIAGNOSIS — I10 Essential (primary) hypertension: Secondary | ICD-10-CM | POA: Diagnosis not present

## 2018-05-30 DIAGNOSIS — G301 Alzheimer's disease with late onset: Secondary | ICD-10-CM | POA: Diagnosis not present

## 2018-05-31 DIAGNOSIS — K219 Gastro-esophageal reflux disease without esophagitis: Secondary | ICD-10-CM | POA: Diagnosis not present

## 2018-05-31 DIAGNOSIS — J449 Chronic obstructive pulmonary disease, unspecified: Secondary | ICD-10-CM | POA: Diagnosis not present

## 2018-05-31 DIAGNOSIS — G301 Alzheimer's disease with late onset: Secondary | ICD-10-CM | POA: Diagnosis not present

## 2018-05-31 DIAGNOSIS — E782 Mixed hyperlipidemia: Secondary | ICD-10-CM | POA: Diagnosis not present

## 2018-05-31 DIAGNOSIS — I10 Essential (primary) hypertension: Secondary | ICD-10-CM | POA: Diagnosis not present

## 2018-05-31 DIAGNOSIS — E119 Type 2 diabetes mellitus without complications: Secondary | ICD-10-CM | POA: Diagnosis not present

## 2018-05-31 DIAGNOSIS — Z7902 Long term (current) use of antithrombotics/antiplatelets: Secondary | ICD-10-CM | POA: Diagnosis not present

## 2018-05-31 DIAGNOSIS — I69321 Dysphasia following cerebral infarction: Secondary | ICD-10-CM | POA: Diagnosis not present

## 2018-05-31 DIAGNOSIS — F0280 Dementia in other diseases classified elsewhere without behavioral disturbance: Secondary | ICD-10-CM | POA: Diagnosis not present

## 2018-06-04 DIAGNOSIS — I69321 Dysphasia following cerebral infarction: Secondary | ICD-10-CM | POA: Diagnosis not present

## 2018-06-04 DIAGNOSIS — K219 Gastro-esophageal reflux disease without esophagitis: Secondary | ICD-10-CM | POA: Diagnosis not present

## 2018-06-04 DIAGNOSIS — Z7902 Long term (current) use of antithrombotics/antiplatelets: Secondary | ICD-10-CM | POA: Diagnosis not present

## 2018-06-04 DIAGNOSIS — E782 Mixed hyperlipidemia: Secondary | ICD-10-CM | POA: Diagnosis not present

## 2018-06-04 DIAGNOSIS — E119 Type 2 diabetes mellitus without complications: Secondary | ICD-10-CM | POA: Diagnosis not present

## 2018-06-04 DIAGNOSIS — G301 Alzheimer's disease with late onset: Secondary | ICD-10-CM | POA: Diagnosis not present

## 2018-06-04 DIAGNOSIS — F0280 Dementia in other diseases classified elsewhere without behavioral disturbance: Secondary | ICD-10-CM | POA: Diagnosis not present

## 2018-06-04 DIAGNOSIS — I10 Essential (primary) hypertension: Secondary | ICD-10-CM | POA: Diagnosis not present

## 2018-06-04 DIAGNOSIS — J449 Chronic obstructive pulmonary disease, unspecified: Secondary | ICD-10-CM | POA: Diagnosis not present

## 2018-06-06 DIAGNOSIS — E119 Type 2 diabetes mellitus without complications: Secondary | ICD-10-CM | POA: Diagnosis not present

## 2018-06-06 DIAGNOSIS — J449 Chronic obstructive pulmonary disease, unspecified: Secondary | ICD-10-CM | POA: Diagnosis not present

## 2018-06-06 DIAGNOSIS — F0280 Dementia in other diseases classified elsewhere without behavioral disturbance: Secondary | ICD-10-CM | POA: Diagnosis not present

## 2018-06-06 DIAGNOSIS — Z7902 Long term (current) use of antithrombotics/antiplatelets: Secondary | ICD-10-CM | POA: Diagnosis not present

## 2018-06-06 DIAGNOSIS — I10 Essential (primary) hypertension: Secondary | ICD-10-CM | POA: Diagnosis not present

## 2018-06-06 DIAGNOSIS — K219 Gastro-esophageal reflux disease without esophagitis: Secondary | ICD-10-CM | POA: Diagnosis not present

## 2018-06-06 DIAGNOSIS — E782 Mixed hyperlipidemia: Secondary | ICD-10-CM | POA: Diagnosis not present

## 2018-06-06 DIAGNOSIS — I69321 Dysphasia following cerebral infarction: Secondary | ICD-10-CM | POA: Diagnosis not present

## 2018-06-06 DIAGNOSIS — G301 Alzheimer's disease with late onset: Secondary | ICD-10-CM | POA: Diagnosis not present

## 2018-06-07 DIAGNOSIS — E782 Mixed hyperlipidemia: Secondary | ICD-10-CM | POA: Diagnosis not present

## 2018-06-07 DIAGNOSIS — J449 Chronic obstructive pulmonary disease, unspecified: Secondary | ICD-10-CM | POA: Diagnosis not present

## 2018-06-07 DIAGNOSIS — K219 Gastro-esophageal reflux disease without esophagitis: Secondary | ICD-10-CM | POA: Diagnosis not present

## 2018-06-07 DIAGNOSIS — I69321 Dysphasia following cerebral infarction: Secondary | ICD-10-CM | POA: Diagnosis not present

## 2018-06-07 DIAGNOSIS — G301 Alzheimer's disease with late onset: Secondary | ICD-10-CM | POA: Diagnosis not present

## 2018-06-07 DIAGNOSIS — E119 Type 2 diabetes mellitus without complications: Secondary | ICD-10-CM | POA: Diagnosis not present

## 2018-06-07 DIAGNOSIS — Z7902 Long term (current) use of antithrombotics/antiplatelets: Secondary | ICD-10-CM | POA: Diagnosis not present

## 2018-06-07 DIAGNOSIS — I10 Essential (primary) hypertension: Secondary | ICD-10-CM | POA: Diagnosis not present

## 2018-06-07 DIAGNOSIS — F0280 Dementia in other diseases classified elsewhere without behavioral disturbance: Secondary | ICD-10-CM | POA: Diagnosis not present

## 2018-06-11 DIAGNOSIS — I69321 Dysphasia following cerebral infarction: Secondary | ICD-10-CM | POA: Diagnosis not present

## 2018-06-11 DIAGNOSIS — F0280 Dementia in other diseases classified elsewhere without behavioral disturbance: Secondary | ICD-10-CM | POA: Diagnosis not present

## 2018-06-11 DIAGNOSIS — E119 Type 2 diabetes mellitus without complications: Secondary | ICD-10-CM | POA: Diagnosis not present

## 2018-06-11 DIAGNOSIS — E782 Mixed hyperlipidemia: Secondary | ICD-10-CM | POA: Diagnosis not present

## 2018-06-11 DIAGNOSIS — K219 Gastro-esophageal reflux disease without esophagitis: Secondary | ICD-10-CM | POA: Diagnosis not present

## 2018-06-11 DIAGNOSIS — J449 Chronic obstructive pulmonary disease, unspecified: Secondary | ICD-10-CM | POA: Diagnosis not present

## 2018-06-11 DIAGNOSIS — Z7902 Long term (current) use of antithrombotics/antiplatelets: Secondary | ICD-10-CM | POA: Diagnosis not present

## 2018-06-11 DIAGNOSIS — G301 Alzheimer's disease with late onset: Secondary | ICD-10-CM | POA: Diagnosis not present

## 2018-06-11 DIAGNOSIS — I10 Essential (primary) hypertension: Secondary | ICD-10-CM | POA: Diagnosis not present

## 2018-06-13 DIAGNOSIS — J449 Chronic obstructive pulmonary disease, unspecified: Secondary | ICD-10-CM | POA: Diagnosis not present

## 2018-06-13 DIAGNOSIS — E782 Mixed hyperlipidemia: Secondary | ICD-10-CM | POA: Diagnosis not present

## 2018-06-13 DIAGNOSIS — E119 Type 2 diabetes mellitus without complications: Secondary | ICD-10-CM | POA: Diagnosis not present

## 2018-06-13 DIAGNOSIS — K219 Gastro-esophageal reflux disease without esophagitis: Secondary | ICD-10-CM | POA: Diagnosis not present

## 2018-06-13 DIAGNOSIS — F0280 Dementia in other diseases classified elsewhere without behavioral disturbance: Secondary | ICD-10-CM | POA: Diagnosis not present

## 2018-06-13 DIAGNOSIS — G301 Alzheimer's disease with late onset: Secondary | ICD-10-CM | POA: Diagnosis not present

## 2018-06-13 DIAGNOSIS — Z7902 Long term (current) use of antithrombotics/antiplatelets: Secondary | ICD-10-CM | POA: Diagnosis not present

## 2018-06-13 DIAGNOSIS — I69321 Dysphasia following cerebral infarction: Secondary | ICD-10-CM | POA: Diagnosis not present

## 2018-06-13 DIAGNOSIS — I10 Essential (primary) hypertension: Secondary | ICD-10-CM | POA: Diagnosis not present

## 2018-06-18 ENCOUNTER — Other Ambulatory Visit: Payer: Self-pay | Admitting: Family Medicine

## 2018-06-18 NOTE — Telephone Encounter (Signed)
Nurses-please try to verify it is this patient still under our care?  I thought she had transferred or is under someone else's care at this point

## 2018-06-19 DIAGNOSIS — G301 Alzheimer's disease with late onset: Secondary | ICD-10-CM | POA: Diagnosis not present

## 2018-06-19 DIAGNOSIS — J449 Chronic obstructive pulmonary disease, unspecified: Secondary | ICD-10-CM | POA: Diagnosis not present

## 2018-06-19 DIAGNOSIS — Z7902 Long term (current) use of antithrombotics/antiplatelets: Secondary | ICD-10-CM | POA: Diagnosis not present

## 2018-06-19 DIAGNOSIS — F0280 Dementia in other diseases classified elsewhere without behavioral disturbance: Secondary | ICD-10-CM | POA: Diagnosis not present

## 2018-06-19 DIAGNOSIS — E119 Type 2 diabetes mellitus without complications: Secondary | ICD-10-CM | POA: Diagnosis not present

## 2018-06-19 DIAGNOSIS — I69321 Dysphasia following cerebral infarction: Secondary | ICD-10-CM | POA: Diagnosis not present

## 2018-06-19 DIAGNOSIS — K219 Gastro-esophageal reflux disease without esophagitis: Secondary | ICD-10-CM | POA: Diagnosis not present

## 2018-06-19 DIAGNOSIS — I10 Essential (primary) hypertension: Secondary | ICD-10-CM | POA: Diagnosis not present

## 2018-06-19 DIAGNOSIS — E782 Mixed hyperlipidemia: Secondary | ICD-10-CM | POA: Diagnosis not present

## 2018-06-20 DIAGNOSIS — K219 Gastro-esophageal reflux disease without esophagitis: Secondary | ICD-10-CM | POA: Diagnosis not present

## 2018-06-20 DIAGNOSIS — G301 Alzheimer's disease with late onset: Secondary | ICD-10-CM | POA: Diagnosis not present

## 2018-06-20 DIAGNOSIS — I10 Essential (primary) hypertension: Secondary | ICD-10-CM | POA: Diagnosis not present

## 2018-06-20 DIAGNOSIS — F0280 Dementia in other diseases classified elsewhere without behavioral disturbance: Secondary | ICD-10-CM | POA: Diagnosis not present

## 2018-06-20 DIAGNOSIS — J449 Chronic obstructive pulmonary disease, unspecified: Secondary | ICD-10-CM | POA: Diagnosis not present

## 2018-06-20 DIAGNOSIS — I69321 Dysphasia following cerebral infarction: Secondary | ICD-10-CM | POA: Diagnosis not present

## 2018-06-20 DIAGNOSIS — Z7902 Long term (current) use of antithrombotics/antiplatelets: Secondary | ICD-10-CM | POA: Diagnosis not present

## 2018-06-20 DIAGNOSIS — E782 Mixed hyperlipidemia: Secondary | ICD-10-CM | POA: Diagnosis not present

## 2018-06-20 DIAGNOSIS — E119 Type 2 diabetes mellitus without complications: Secondary | ICD-10-CM | POA: Diagnosis not present

## 2018-06-21 DIAGNOSIS — I10 Essential (primary) hypertension: Secondary | ICD-10-CM | POA: Diagnosis not present

## 2018-06-21 DIAGNOSIS — Z7902 Long term (current) use of antithrombotics/antiplatelets: Secondary | ICD-10-CM | POA: Diagnosis not present

## 2018-06-21 DIAGNOSIS — K219 Gastro-esophageal reflux disease without esophagitis: Secondary | ICD-10-CM | POA: Diagnosis not present

## 2018-06-21 DIAGNOSIS — J449 Chronic obstructive pulmonary disease, unspecified: Secondary | ICD-10-CM | POA: Diagnosis not present

## 2018-06-21 DIAGNOSIS — E782 Mixed hyperlipidemia: Secondary | ICD-10-CM | POA: Diagnosis not present

## 2018-06-21 DIAGNOSIS — E119 Type 2 diabetes mellitus without complications: Secondary | ICD-10-CM | POA: Diagnosis not present

## 2018-06-21 DIAGNOSIS — I69321 Dysphasia following cerebral infarction: Secondary | ICD-10-CM | POA: Diagnosis not present

## 2018-06-21 DIAGNOSIS — F0280 Dementia in other diseases classified elsewhere without behavioral disturbance: Secondary | ICD-10-CM | POA: Diagnosis not present

## 2018-06-21 DIAGNOSIS — G301 Alzheimer's disease with late onset: Secondary | ICD-10-CM | POA: Diagnosis not present

## 2018-06-21 NOTE — Telephone Encounter (Signed)
Spoke with daughter Misty Stanley. Daughter states that patient is no longer our patient. Pt is been cared for by Dr.Daniel in [place].

## 2018-06-21 NOTE — Telephone Encounter (Signed)
Please have the pharmacy send the prescription request to Dr. Garner Nash in [place]

## 2018-07-22 DIAGNOSIS — E1165 Type 2 diabetes mellitus with hyperglycemia: Secondary | ICD-10-CM | POA: Diagnosis not present

## 2018-07-22 DIAGNOSIS — G301 Alzheimer's disease with late onset: Secondary | ICD-10-CM | POA: Diagnosis not present

## 2018-08-07 DIAGNOSIS — G301 Alzheimer's disease with late onset: Secondary | ICD-10-CM | POA: Diagnosis not present

## 2018-08-08 DIAGNOSIS — G301 Alzheimer's disease with late onset: Secondary | ICD-10-CM | POA: Diagnosis not present

## 2018-08-31 DIAGNOSIS — E1165 Type 2 diabetes mellitus with hyperglycemia: Secondary | ICD-10-CM | POA: Diagnosis not present

## 2018-08-31 DIAGNOSIS — R69 Illness, unspecified: Secondary | ICD-10-CM | POA: Diagnosis not present

## 2018-09-05 DIAGNOSIS — R69 Illness, unspecified: Secondary | ICD-10-CM | POA: Diagnosis not present

## 2018-09-14 DIAGNOSIS — G301 Alzheimer's disease with late onset: Secondary | ICD-10-CM | POA: Diagnosis not present

## 2018-09-15 ENCOUNTER — Other Ambulatory Visit: Payer: Self-pay | Admitting: Family Medicine

## 2018-09-15 DIAGNOSIS — E1165 Type 2 diabetes mellitus with hyperglycemia: Secondary | ICD-10-CM | POA: Diagnosis not present

## 2018-09-15 DIAGNOSIS — E441 Mild protein-calorie malnutrition: Secondary | ICD-10-CM | POA: Diagnosis not present

## 2018-09-15 DIAGNOSIS — I10 Essential (primary) hypertension: Secondary | ICD-10-CM | POA: Diagnosis not present

## 2018-09-15 DIAGNOSIS — L89152 Pressure ulcer of sacral region, stage 2: Secondary | ICD-10-CM | POA: Diagnosis not present

## 2018-09-15 DIAGNOSIS — G301 Alzheimer's disease with late onset: Secondary | ICD-10-CM | POA: Diagnosis not present

## 2018-09-15 DIAGNOSIS — I69321 Dysphasia following cerebral infarction: Secondary | ICD-10-CM | POA: Diagnosis not present

## 2018-09-15 DIAGNOSIS — E782 Mixed hyperlipidemia: Secondary | ICD-10-CM | POA: Diagnosis not present

## 2018-09-15 DIAGNOSIS — L89102 Pressure ulcer of unspecified part of back, stage 2: Secondary | ICD-10-CM | POA: Diagnosis not present

## 2018-09-17 NOTE — Telephone Encounter (Signed)
Not sure why we got this I believe that she is under the care of another physician I could be wrong Check with her daughter

## 2018-09-21 DIAGNOSIS — I69321 Dysphasia following cerebral infarction: Secondary | ICD-10-CM | POA: Diagnosis not present

## 2018-09-21 DIAGNOSIS — E1165 Type 2 diabetes mellitus with hyperglycemia: Secondary | ICD-10-CM | POA: Diagnosis not present

## 2018-09-21 DIAGNOSIS — G301 Alzheimer's disease with late onset: Secondary | ICD-10-CM | POA: Diagnosis not present

## 2018-09-21 DIAGNOSIS — F0280 Dementia in other diseases classified elsewhere without behavioral disturbance: Secondary | ICD-10-CM | POA: Diagnosis not present

## 2018-09-21 DIAGNOSIS — Z7902 Long term (current) use of antithrombotics/antiplatelets: Secondary | ICD-10-CM | POA: Diagnosis not present

## 2018-09-21 DIAGNOSIS — L89152 Pressure ulcer of sacral region, stage 2: Secondary | ICD-10-CM | POA: Diagnosis not present

## 2018-09-21 DIAGNOSIS — Z7401 Bed confinement status: Secondary | ICD-10-CM | POA: Diagnosis not present

## 2018-09-21 DIAGNOSIS — L89122 Pressure ulcer of left upper back, stage 2: Secondary | ICD-10-CM | POA: Diagnosis not present

## 2018-09-21 DIAGNOSIS — Z4800 Encounter for change or removal of nonsurgical wound dressing: Secondary | ICD-10-CM | POA: Diagnosis not present

## 2018-09-25 DIAGNOSIS — L89152 Pressure ulcer of sacral region, stage 2: Secondary | ICD-10-CM | POA: Diagnosis not present

## 2018-09-25 DIAGNOSIS — F0280 Dementia in other diseases classified elsewhere without behavioral disturbance: Secondary | ICD-10-CM | POA: Diagnosis not present

## 2018-09-25 DIAGNOSIS — Z7401 Bed confinement status: Secondary | ICD-10-CM | POA: Diagnosis not present

## 2018-09-25 DIAGNOSIS — G301 Alzheimer's disease with late onset: Secondary | ICD-10-CM | POA: Diagnosis not present

## 2018-09-25 DIAGNOSIS — I69321 Dysphasia following cerebral infarction: Secondary | ICD-10-CM | POA: Diagnosis not present

## 2018-09-25 DIAGNOSIS — Z4800 Encounter for change or removal of nonsurgical wound dressing: Secondary | ICD-10-CM | POA: Diagnosis not present

## 2018-09-25 DIAGNOSIS — E1165 Type 2 diabetes mellitus with hyperglycemia: Secondary | ICD-10-CM | POA: Diagnosis not present

## 2018-09-25 DIAGNOSIS — Z7902 Long term (current) use of antithrombotics/antiplatelets: Secondary | ICD-10-CM | POA: Diagnosis not present

## 2018-09-25 DIAGNOSIS — L89122 Pressure ulcer of left upper back, stage 2: Secondary | ICD-10-CM | POA: Diagnosis not present

## 2018-09-27 DIAGNOSIS — Z7902 Long term (current) use of antithrombotics/antiplatelets: Secondary | ICD-10-CM | POA: Diagnosis not present

## 2018-09-27 DIAGNOSIS — Z7401 Bed confinement status: Secondary | ICD-10-CM | POA: Diagnosis not present

## 2018-09-27 DIAGNOSIS — Z4800 Encounter for change or removal of nonsurgical wound dressing: Secondary | ICD-10-CM | POA: Diagnosis not present

## 2018-09-27 DIAGNOSIS — L89122 Pressure ulcer of left upper back, stage 2: Secondary | ICD-10-CM | POA: Diagnosis not present

## 2018-09-27 DIAGNOSIS — G301 Alzheimer's disease with late onset: Secondary | ICD-10-CM | POA: Diagnosis not present

## 2018-09-27 DIAGNOSIS — E1165 Type 2 diabetes mellitus with hyperglycemia: Secondary | ICD-10-CM | POA: Diagnosis not present

## 2018-09-27 DIAGNOSIS — I69321 Dysphasia following cerebral infarction: Secondary | ICD-10-CM | POA: Diagnosis not present

## 2018-09-27 DIAGNOSIS — L89152 Pressure ulcer of sacral region, stage 2: Secondary | ICD-10-CM | POA: Diagnosis not present

## 2018-09-27 DIAGNOSIS — F0280 Dementia in other diseases classified elsewhere without behavioral disturbance: Secondary | ICD-10-CM | POA: Diagnosis not present

## 2018-09-28 DIAGNOSIS — I69321 Dysphasia following cerebral infarction: Secondary | ICD-10-CM | POA: Diagnosis not present

## 2018-09-28 DIAGNOSIS — Z7401 Bed confinement status: Secondary | ICD-10-CM | POA: Diagnosis not present

## 2018-09-28 DIAGNOSIS — F0280 Dementia in other diseases classified elsewhere without behavioral disturbance: Secondary | ICD-10-CM | POA: Diagnosis not present

## 2018-09-28 DIAGNOSIS — L89152 Pressure ulcer of sacral region, stage 2: Secondary | ICD-10-CM | POA: Diagnosis not present

## 2018-09-28 DIAGNOSIS — E1165 Type 2 diabetes mellitus with hyperglycemia: Secondary | ICD-10-CM | POA: Diagnosis not present

## 2018-09-28 DIAGNOSIS — G301 Alzheimer's disease with late onset: Secondary | ICD-10-CM | POA: Diagnosis not present

## 2018-09-28 DIAGNOSIS — Z7902 Long term (current) use of antithrombotics/antiplatelets: Secondary | ICD-10-CM | POA: Diagnosis not present

## 2018-09-28 DIAGNOSIS — Z4800 Encounter for change or removal of nonsurgical wound dressing: Secondary | ICD-10-CM | POA: Diagnosis not present

## 2018-09-28 DIAGNOSIS — L89122 Pressure ulcer of left upper back, stage 2: Secondary | ICD-10-CM | POA: Diagnosis not present

## 2018-09-29 DIAGNOSIS — G301 Alzheimer's disease with late onset: Secondary | ICD-10-CM | POA: Diagnosis not present

## 2018-09-29 DIAGNOSIS — E1165 Type 2 diabetes mellitus with hyperglycemia: Secondary | ICD-10-CM | POA: Diagnosis not present

## 2018-09-29 DIAGNOSIS — R69 Illness, unspecified: Secondary | ICD-10-CM | POA: Diagnosis not present

## 2018-10-02 DIAGNOSIS — I69321 Dysphasia following cerebral infarction: Secondary | ICD-10-CM | POA: Diagnosis not present

## 2018-10-02 DIAGNOSIS — L89122 Pressure ulcer of left upper back, stage 2: Secondary | ICD-10-CM | POA: Diagnosis not present

## 2018-10-02 DIAGNOSIS — Z7902 Long term (current) use of antithrombotics/antiplatelets: Secondary | ICD-10-CM | POA: Diagnosis not present

## 2018-10-02 DIAGNOSIS — Z4800 Encounter for change or removal of nonsurgical wound dressing: Secondary | ICD-10-CM | POA: Diagnosis not present

## 2018-10-02 DIAGNOSIS — E1165 Type 2 diabetes mellitus with hyperglycemia: Secondary | ICD-10-CM | POA: Diagnosis not present

## 2018-10-02 DIAGNOSIS — F0280 Dementia in other diseases classified elsewhere without behavioral disturbance: Secondary | ICD-10-CM | POA: Diagnosis not present

## 2018-10-02 DIAGNOSIS — L89152 Pressure ulcer of sacral region, stage 2: Secondary | ICD-10-CM | POA: Diagnosis not present

## 2018-10-02 DIAGNOSIS — Z7401 Bed confinement status: Secondary | ICD-10-CM | POA: Diagnosis not present

## 2018-10-02 DIAGNOSIS — G301 Alzheimer's disease with late onset: Secondary | ICD-10-CM | POA: Diagnosis not present

## 2018-10-04 DIAGNOSIS — L89152 Pressure ulcer of sacral region, stage 2: Secondary | ICD-10-CM | POA: Diagnosis not present

## 2018-10-04 DIAGNOSIS — F0280 Dementia in other diseases classified elsewhere without behavioral disturbance: Secondary | ICD-10-CM | POA: Diagnosis not present

## 2018-10-04 DIAGNOSIS — L89122 Pressure ulcer of left upper back, stage 2: Secondary | ICD-10-CM | POA: Diagnosis not present

## 2018-10-04 DIAGNOSIS — E1165 Type 2 diabetes mellitus with hyperglycemia: Secondary | ICD-10-CM | POA: Diagnosis not present

## 2018-10-04 DIAGNOSIS — I69321 Dysphasia following cerebral infarction: Secondary | ICD-10-CM | POA: Diagnosis not present

## 2018-10-04 DIAGNOSIS — Z7902 Long term (current) use of antithrombotics/antiplatelets: Secondary | ICD-10-CM | POA: Diagnosis not present

## 2018-10-04 DIAGNOSIS — Z4800 Encounter for change or removal of nonsurgical wound dressing: Secondary | ICD-10-CM | POA: Diagnosis not present

## 2018-10-04 DIAGNOSIS — Z7401 Bed confinement status: Secondary | ICD-10-CM | POA: Diagnosis not present

## 2018-10-04 DIAGNOSIS — G301 Alzheimer's disease with late onset: Secondary | ICD-10-CM | POA: Diagnosis not present

## 2018-10-05 DIAGNOSIS — Z7902 Long term (current) use of antithrombotics/antiplatelets: Secondary | ICD-10-CM | POA: Diagnosis not present

## 2018-10-05 DIAGNOSIS — L89152 Pressure ulcer of sacral region, stage 2: Secondary | ICD-10-CM | POA: Diagnosis not present

## 2018-10-05 DIAGNOSIS — E1165 Type 2 diabetes mellitus with hyperglycemia: Secondary | ICD-10-CM | POA: Diagnosis not present

## 2018-10-05 DIAGNOSIS — Z4800 Encounter for change or removal of nonsurgical wound dressing: Secondary | ICD-10-CM | POA: Diagnosis not present

## 2018-10-05 DIAGNOSIS — Z7401 Bed confinement status: Secondary | ICD-10-CM | POA: Diagnosis not present

## 2018-10-05 DIAGNOSIS — G301 Alzheimer's disease with late onset: Secondary | ICD-10-CM | POA: Diagnosis not present

## 2018-10-05 DIAGNOSIS — L89122 Pressure ulcer of left upper back, stage 2: Secondary | ICD-10-CM | POA: Diagnosis not present

## 2018-10-05 DIAGNOSIS — F0280 Dementia in other diseases classified elsewhere without behavioral disturbance: Secondary | ICD-10-CM | POA: Diagnosis not present

## 2018-10-05 DIAGNOSIS — I69321 Dysphasia following cerebral infarction: Secondary | ICD-10-CM | POA: Diagnosis not present

## 2018-10-06 DIAGNOSIS — R69 Illness, unspecified: Secondary | ICD-10-CM | POA: Diagnosis not present

## 2018-10-09 DIAGNOSIS — Z7401 Bed confinement status: Secondary | ICD-10-CM | POA: Diagnosis not present

## 2018-10-09 DIAGNOSIS — L89152 Pressure ulcer of sacral region, stage 2: Secondary | ICD-10-CM | POA: Diagnosis not present

## 2018-10-09 DIAGNOSIS — F0280 Dementia in other diseases classified elsewhere without behavioral disturbance: Secondary | ICD-10-CM | POA: Diagnosis not present

## 2018-10-09 DIAGNOSIS — Z4800 Encounter for change or removal of nonsurgical wound dressing: Secondary | ICD-10-CM | POA: Diagnosis not present

## 2018-10-09 DIAGNOSIS — I69321 Dysphasia following cerebral infarction: Secondary | ICD-10-CM | POA: Diagnosis not present

## 2018-10-09 DIAGNOSIS — G301 Alzheimer's disease with late onset: Secondary | ICD-10-CM | POA: Diagnosis not present

## 2018-10-09 DIAGNOSIS — Z7902 Long term (current) use of antithrombotics/antiplatelets: Secondary | ICD-10-CM | POA: Diagnosis not present

## 2018-10-09 DIAGNOSIS — L89122 Pressure ulcer of left upper back, stage 2: Secondary | ICD-10-CM | POA: Diagnosis not present

## 2018-10-09 DIAGNOSIS — E1165 Type 2 diabetes mellitus with hyperglycemia: Secondary | ICD-10-CM | POA: Diagnosis not present

## 2018-10-11 DIAGNOSIS — Z7401 Bed confinement status: Secondary | ICD-10-CM | POA: Diagnosis not present

## 2018-10-11 DIAGNOSIS — E1165 Type 2 diabetes mellitus with hyperglycemia: Secondary | ICD-10-CM | POA: Diagnosis not present

## 2018-10-11 DIAGNOSIS — L89152 Pressure ulcer of sacral region, stage 2: Secondary | ICD-10-CM | POA: Diagnosis not present

## 2018-10-11 DIAGNOSIS — G301 Alzheimer's disease with late onset: Secondary | ICD-10-CM | POA: Diagnosis not present

## 2018-10-11 DIAGNOSIS — Z4800 Encounter for change or removal of nonsurgical wound dressing: Secondary | ICD-10-CM | POA: Diagnosis not present

## 2018-10-11 DIAGNOSIS — Z7902 Long term (current) use of antithrombotics/antiplatelets: Secondary | ICD-10-CM | POA: Diagnosis not present

## 2018-10-11 DIAGNOSIS — L89122 Pressure ulcer of left upper back, stage 2: Secondary | ICD-10-CM | POA: Diagnosis not present

## 2018-10-11 DIAGNOSIS — I69321 Dysphasia following cerebral infarction: Secondary | ICD-10-CM | POA: Diagnosis not present

## 2018-10-11 DIAGNOSIS — F0280 Dementia in other diseases classified elsewhere without behavioral disturbance: Secondary | ICD-10-CM | POA: Diagnosis not present

## 2018-10-12 DIAGNOSIS — F0280 Dementia in other diseases classified elsewhere without behavioral disturbance: Secondary | ICD-10-CM | POA: Diagnosis not present

## 2018-10-12 DIAGNOSIS — Z4800 Encounter for change or removal of nonsurgical wound dressing: Secondary | ICD-10-CM | POA: Diagnosis not present

## 2018-10-12 DIAGNOSIS — Z7902 Long term (current) use of antithrombotics/antiplatelets: Secondary | ICD-10-CM | POA: Diagnosis not present

## 2018-10-12 DIAGNOSIS — L89152 Pressure ulcer of sacral region, stage 2: Secondary | ICD-10-CM | POA: Diagnosis not present

## 2018-10-12 DIAGNOSIS — G301 Alzheimer's disease with late onset: Secondary | ICD-10-CM | POA: Diagnosis not present

## 2018-10-12 DIAGNOSIS — L89122 Pressure ulcer of left upper back, stage 2: Secondary | ICD-10-CM | POA: Diagnosis not present

## 2018-10-12 DIAGNOSIS — I69321 Dysphasia following cerebral infarction: Secondary | ICD-10-CM | POA: Diagnosis not present

## 2018-10-12 DIAGNOSIS — Z7401 Bed confinement status: Secondary | ICD-10-CM | POA: Diagnosis not present

## 2018-10-12 DIAGNOSIS — E1165 Type 2 diabetes mellitus with hyperglycemia: Secondary | ICD-10-CM | POA: Diagnosis not present

## 2018-10-15 DIAGNOSIS — E1165 Type 2 diabetes mellitus with hyperglycemia: Secondary | ICD-10-CM | POA: Diagnosis not present

## 2018-10-15 DIAGNOSIS — Z4800 Encounter for change or removal of nonsurgical wound dressing: Secondary | ICD-10-CM | POA: Diagnosis not present

## 2018-10-15 DIAGNOSIS — Z7902 Long term (current) use of antithrombotics/antiplatelets: Secondary | ICD-10-CM | POA: Diagnosis not present

## 2018-10-15 DIAGNOSIS — L89122 Pressure ulcer of left upper back, stage 2: Secondary | ICD-10-CM | POA: Diagnosis not present

## 2018-10-15 DIAGNOSIS — G301 Alzheimer's disease with late onset: Secondary | ICD-10-CM | POA: Diagnosis not present

## 2018-10-15 DIAGNOSIS — F0280 Dementia in other diseases classified elsewhere without behavioral disturbance: Secondary | ICD-10-CM | POA: Diagnosis not present

## 2018-10-15 DIAGNOSIS — Z7401 Bed confinement status: Secondary | ICD-10-CM | POA: Diagnosis not present

## 2018-10-15 DIAGNOSIS — I69321 Dysphasia following cerebral infarction: Secondary | ICD-10-CM | POA: Diagnosis not present

## 2018-10-15 DIAGNOSIS — L89152 Pressure ulcer of sacral region, stage 2: Secondary | ICD-10-CM | POA: Diagnosis not present

## 2018-10-17 ENCOUNTER — Other Ambulatory Visit (HOSPITAL_COMMUNITY)
Admission: RE | Admit: 2018-10-17 | Discharge: 2018-10-17 | Disposition: A | Discharge status: Home / Self Care | Payer: Medicare HMO | Source: Other Acute Inpatient Hospital | Attending: Family Medicine | Admitting: Family Medicine

## 2018-10-17 DIAGNOSIS — L89122 Pressure ulcer of left upper back, stage 2: Secondary | ICD-10-CM | POA: Diagnosis not present

## 2018-10-17 DIAGNOSIS — E1165 Type 2 diabetes mellitus with hyperglycemia: Secondary | ICD-10-CM | POA: Insufficient documentation

## 2018-10-17 DIAGNOSIS — L89152 Pressure ulcer of sacral region, stage 2: Secondary | ICD-10-CM | POA: Diagnosis not present

## 2018-10-17 DIAGNOSIS — I69321 Dysphasia following cerebral infarction: Secondary | ICD-10-CM | POA: Diagnosis not present

## 2018-10-17 DIAGNOSIS — F0280 Dementia in other diseases classified elsewhere without behavioral disturbance: Secondary | ICD-10-CM | POA: Insufficient documentation

## 2018-10-17 DIAGNOSIS — Z7902 Long term (current) use of antithrombotics/antiplatelets: Secondary | ICD-10-CM | POA: Diagnosis not present

## 2018-10-17 DIAGNOSIS — G301 Alzheimer's disease with late onset: Secondary | ICD-10-CM | POA: Diagnosis not present

## 2018-10-17 DIAGNOSIS — Z4800 Encounter for change or removal of nonsurgical wound dressing: Secondary | ICD-10-CM | POA: Diagnosis not present

## 2018-10-17 DIAGNOSIS — Z7401 Bed confinement status: Secondary | ICD-10-CM | POA: Diagnosis not present

## 2018-10-17 LAB — COMPREHENSIVE METABOLIC PANEL
ALT: 8 U/L (ref 0–44)
AST: 15 U/L (ref 15–41)
Albumin: 2.9 g/dL — ABNORMAL LOW (ref 3.5–5.0)
Alkaline Phosphatase: 41 U/L (ref 38–126)
Anion gap: 10 (ref 5–15)
BUN: 7 mg/dL — ABNORMAL LOW (ref 8–23)
CO2: 24 mmol/L (ref 22–32)
Calcium: 8.4 mg/dL — ABNORMAL LOW (ref 8.9–10.3)
Chloride: 102 mmol/L (ref 98–111)
Creatinine, Ser: 0.38 mg/dL — ABNORMAL LOW (ref 0.44–1.00)
GFR calc Af Amer: >60 mL/min (ref >60)
GFR calc non Af Amer: >60 mL/min (ref >60)
GLUCOSE: 163 mg/dL — AB (ref 70–99)
Potassium: 3.4 mmol/L — ABNORMAL LOW (ref 3.5–5.1)
SODIUM: 136 mmol/L (ref 135–145)
Total Bilirubin: 0.9 mg/dL (ref 0.3–1.2)
Total Protein: 5.5 g/dL — ABNORMAL LOW (ref 6.5–8.1)

## 2018-10-17 LAB — LIPID PANEL
Cholesterol: 194 mg/dL (ref 0–200)
HDL: 58 mg/dL (ref >40)
LDL Cholesterol: 118 mg/dL — ABNORMAL HIGH (ref 0–99)
Total CHOL/HDL Ratio: 3.3 RATIO
Triglycerides: 88 mg/dL (ref <150)
VLDL: 18 mg/dL (ref 0–40)

## 2018-10-17 LAB — CBC
HCT: 39.6 % (ref 36.0–46.0)
Hemoglobin: 12.7 g/dL (ref 12.0–15.0)
MCH: 30.7 pg (ref 26.0–34.0)
MCHC: 32.1 g/dL (ref 30.0–36.0)
MCV: 95.7 fL (ref 80.0–100.0)
Platelets: 174 10*3/uL (ref 150–400)
RBC: 4.14 MIL/uL (ref 3.87–5.11)
RDW: 15.1 % (ref 11.5–15.5)
WBC: 4.9 10*3/uL (ref 4.0–10.5)
nRBC: 0 % (ref 0.0–0.2)

## 2018-10-17 LAB — HEMOGLOBIN A1C
Hgb A1c MFr Bld: 5.8 % — ABNORMAL HIGH (ref 4.8–5.6)
Mean Plasma Glucose: 119.76 mg/dL

## 2018-10-18 DIAGNOSIS — G301 Alzheimer's disease with late onset: Secondary | ICD-10-CM | POA: Diagnosis not present

## 2018-10-18 DIAGNOSIS — Z7902 Long term (current) use of antithrombotics/antiplatelets: Secondary | ICD-10-CM | POA: Diagnosis not present

## 2018-10-18 DIAGNOSIS — L89152 Pressure ulcer of sacral region, stage 2: Secondary | ICD-10-CM | POA: Diagnosis not present

## 2018-10-18 DIAGNOSIS — L89122 Pressure ulcer of left upper back, stage 2: Secondary | ICD-10-CM | POA: Diagnosis not present

## 2018-10-18 DIAGNOSIS — Z7401 Bed confinement status: Secondary | ICD-10-CM | POA: Diagnosis not present

## 2018-10-18 DIAGNOSIS — F0280 Dementia in other diseases classified elsewhere without behavioral disturbance: Secondary | ICD-10-CM | POA: Diagnosis not present

## 2018-10-18 DIAGNOSIS — Z4800 Encounter for change or removal of nonsurgical wound dressing: Secondary | ICD-10-CM | POA: Diagnosis not present

## 2018-10-18 DIAGNOSIS — E1165 Type 2 diabetes mellitus with hyperglycemia: Secondary | ICD-10-CM | POA: Diagnosis not present

## 2018-10-18 DIAGNOSIS — I69321 Dysphasia following cerebral infarction: Secondary | ICD-10-CM | POA: Diagnosis not present

## 2018-10-19 DIAGNOSIS — Z4800 Encounter for change or removal of nonsurgical wound dressing: Secondary | ICD-10-CM | POA: Diagnosis not present

## 2018-10-19 DIAGNOSIS — L89152 Pressure ulcer of sacral region, stage 2: Secondary | ICD-10-CM | POA: Diagnosis not present

## 2018-10-19 DIAGNOSIS — Z7902 Long term (current) use of antithrombotics/antiplatelets: Secondary | ICD-10-CM | POA: Diagnosis not present

## 2018-10-19 DIAGNOSIS — G301 Alzheimer's disease with late onset: Secondary | ICD-10-CM | POA: Diagnosis not present

## 2018-10-19 DIAGNOSIS — L89122 Pressure ulcer of left upper back, stage 2: Secondary | ICD-10-CM | POA: Diagnosis not present

## 2018-10-19 DIAGNOSIS — F0280 Dementia in other diseases classified elsewhere without behavioral disturbance: Secondary | ICD-10-CM | POA: Diagnosis not present

## 2018-10-19 DIAGNOSIS — I69321 Dysphasia following cerebral infarction: Secondary | ICD-10-CM | POA: Diagnosis not present

## 2018-10-19 DIAGNOSIS — E1165 Type 2 diabetes mellitus with hyperglycemia: Secondary | ICD-10-CM | POA: Diagnosis not present

## 2018-10-20 DIAGNOSIS — Z7401 Bed confinement status: Secondary | ICD-10-CM | POA: Diagnosis not present

## 2018-10-20 DIAGNOSIS — F0280 Dementia in other diseases classified elsewhere without behavioral disturbance: Secondary | ICD-10-CM | POA: Diagnosis not present

## 2018-10-20 DIAGNOSIS — L89152 Pressure ulcer of sacral region, stage 2: Secondary | ICD-10-CM | POA: Diagnosis not present

## 2018-10-20 DIAGNOSIS — E1165 Type 2 diabetes mellitus with hyperglycemia: Secondary | ICD-10-CM | POA: Diagnosis not present

## 2018-10-20 DIAGNOSIS — G301 Alzheimer's disease with late onset: Secondary | ICD-10-CM | POA: Diagnosis not present

## 2018-10-20 DIAGNOSIS — L89122 Pressure ulcer of left upper back, stage 2: Secondary | ICD-10-CM | POA: Diagnosis not present

## 2018-10-20 DIAGNOSIS — Z4800 Encounter for change or removal of nonsurgical wound dressing: Secondary | ICD-10-CM | POA: Diagnosis not present

## 2018-10-20 DIAGNOSIS — I69321 Dysphasia following cerebral infarction: Secondary | ICD-10-CM | POA: Diagnosis not present

## 2018-10-20 DIAGNOSIS — Z7902 Long term (current) use of antithrombotics/antiplatelets: Secondary | ICD-10-CM | POA: Diagnosis not present

## 2018-10-22 DIAGNOSIS — Z4800 Encounter for change or removal of nonsurgical wound dressing: Secondary | ICD-10-CM | POA: Diagnosis not present

## 2018-10-22 DIAGNOSIS — I69321 Dysphasia following cerebral infarction: Secondary | ICD-10-CM | POA: Diagnosis not present

## 2018-10-22 DIAGNOSIS — Z7401 Bed confinement status: Secondary | ICD-10-CM | POA: Diagnosis not present

## 2018-10-22 DIAGNOSIS — L89122 Pressure ulcer of left upper back, stage 2: Secondary | ICD-10-CM | POA: Diagnosis not present

## 2018-10-22 DIAGNOSIS — G301 Alzheimer's disease with late onset: Secondary | ICD-10-CM | POA: Diagnosis not present

## 2018-10-22 DIAGNOSIS — F0280 Dementia in other diseases classified elsewhere without behavioral disturbance: Secondary | ICD-10-CM | POA: Diagnosis not present

## 2018-10-22 DIAGNOSIS — Z7902 Long term (current) use of antithrombotics/antiplatelets: Secondary | ICD-10-CM | POA: Diagnosis not present

## 2018-10-22 DIAGNOSIS — E1165 Type 2 diabetes mellitus with hyperglycemia: Secondary | ICD-10-CM | POA: Diagnosis not present

## 2018-10-22 DIAGNOSIS — L89152 Pressure ulcer of sacral region, stage 2: Secondary | ICD-10-CM | POA: Diagnosis not present

## 2018-10-24 DIAGNOSIS — Z7401 Bed confinement status: Secondary | ICD-10-CM | POA: Diagnosis not present

## 2018-10-24 DIAGNOSIS — Z4800 Encounter for change or removal of nonsurgical wound dressing: Secondary | ICD-10-CM | POA: Diagnosis not present

## 2018-10-24 DIAGNOSIS — I69321 Dysphasia following cerebral infarction: Secondary | ICD-10-CM | POA: Diagnosis not present

## 2018-10-24 DIAGNOSIS — L89152 Pressure ulcer of sacral region, stage 2: Secondary | ICD-10-CM | POA: Diagnosis not present

## 2018-10-24 DIAGNOSIS — Z7902 Long term (current) use of antithrombotics/antiplatelets: Secondary | ICD-10-CM | POA: Diagnosis not present

## 2018-10-24 DIAGNOSIS — G301 Alzheimer's disease with late onset: Secondary | ICD-10-CM | POA: Diagnosis not present

## 2018-10-24 DIAGNOSIS — F0280 Dementia in other diseases classified elsewhere without behavioral disturbance: Secondary | ICD-10-CM | POA: Diagnosis not present

## 2018-10-24 DIAGNOSIS — E1165 Type 2 diabetes mellitus with hyperglycemia: Secondary | ICD-10-CM | POA: Diagnosis not present

## 2018-10-24 DIAGNOSIS — L89122 Pressure ulcer of left upper back, stage 2: Secondary | ICD-10-CM | POA: Diagnosis not present

## 2018-10-26 DIAGNOSIS — Z4800 Encounter for change or removal of nonsurgical wound dressing: Secondary | ICD-10-CM | POA: Diagnosis not present

## 2018-10-26 DIAGNOSIS — I69321 Dysphasia following cerebral infarction: Secondary | ICD-10-CM | POA: Diagnosis not present

## 2018-10-26 DIAGNOSIS — G301 Alzheimer's disease with late onset: Secondary | ICD-10-CM | POA: Diagnosis not present

## 2018-10-26 DIAGNOSIS — L89122 Pressure ulcer of left upper back, stage 2: Secondary | ICD-10-CM | POA: Diagnosis not present

## 2018-10-26 DIAGNOSIS — Z7902 Long term (current) use of antithrombotics/antiplatelets: Secondary | ICD-10-CM | POA: Diagnosis not present

## 2018-10-26 DIAGNOSIS — Z7401 Bed confinement status: Secondary | ICD-10-CM | POA: Diagnosis not present

## 2018-10-26 DIAGNOSIS — F0280 Dementia in other diseases classified elsewhere without behavioral disturbance: Secondary | ICD-10-CM | POA: Diagnosis not present

## 2018-10-26 DIAGNOSIS — L89152 Pressure ulcer of sacral region, stage 2: Secondary | ICD-10-CM | POA: Diagnosis not present

## 2018-10-26 DIAGNOSIS — E1165 Type 2 diabetes mellitus with hyperglycemia: Secondary | ICD-10-CM | POA: Diagnosis not present

## 2018-11-04 DIAGNOSIS — R69 Illness, unspecified: Secondary | ICD-10-CM | POA: Diagnosis not present

## 2018-11-07 DIAGNOSIS — G301 Alzheimer's disease with late onset: Secondary | ICD-10-CM | POA: Diagnosis not present

## 2018-11-12 DIAGNOSIS — F0280 Dementia in other diseases classified elsewhere without behavioral disturbance: Secondary | ICD-10-CM | POA: Diagnosis not present

## 2018-11-12 DIAGNOSIS — Z7401 Bed confinement status: Secondary | ICD-10-CM | POA: Diagnosis not present

## 2018-11-12 DIAGNOSIS — I69321 Dysphasia following cerebral infarction: Secondary | ICD-10-CM | POA: Diagnosis not present

## 2018-11-12 DIAGNOSIS — Z7902 Long term (current) use of antithrombotics/antiplatelets: Secondary | ICD-10-CM | POA: Diagnosis not present

## 2018-11-12 DIAGNOSIS — Z4800 Encounter for change or removal of nonsurgical wound dressing: Secondary | ICD-10-CM | POA: Diagnosis not present

## 2018-11-12 DIAGNOSIS — G301 Alzheimer's disease with late onset: Secondary | ICD-10-CM | POA: Diagnosis not present

## 2018-11-12 DIAGNOSIS — L89152 Pressure ulcer of sacral region, stage 2: Secondary | ICD-10-CM | POA: Diagnosis not present

## 2018-11-12 DIAGNOSIS — E1165 Type 2 diabetes mellitus with hyperglycemia: Secondary | ICD-10-CM | POA: Diagnosis not present

## 2018-11-12 DIAGNOSIS — L89122 Pressure ulcer of left upper back, stage 2: Secondary | ICD-10-CM | POA: Diagnosis not present

## 2018-11-15 DIAGNOSIS — L89152 Pressure ulcer of sacral region, stage 2: Secondary | ICD-10-CM | POA: Diagnosis not present

## 2018-11-15 DIAGNOSIS — F0280 Dementia in other diseases classified elsewhere without behavioral disturbance: Secondary | ICD-10-CM | POA: Diagnosis not present

## 2018-11-15 DIAGNOSIS — E1165 Type 2 diabetes mellitus with hyperglycemia: Secondary | ICD-10-CM | POA: Diagnosis not present

## 2018-11-15 DIAGNOSIS — Z7401 Bed confinement status: Secondary | ICD-10-CM | POA: Diagnosis not present

## 2018-11-15 DIAGNOSIS — Z4800 Encounter for change or removal of nonsurgical wound dressing: Secondary | ICD-10-CM | POA: Diagnosis not present

## 2018-11-15 DIAGNOSIS — G301 Alzheimer's disease with late onset: Secondary | ICD-10-CM | POA: Diagnosis not present

## 2018-11-15 DIAGNOSIS — L89122 Pressure ulcer of left upper back, stage 2: Secondary | ICD-10-CM | POA: Diagnosis not present

## 2018-11-15 DIAGNOSIS — Z7902 Long term (current) use of antithrombotics/antiplatelets: Secondary | ICD-10-CM | POA: Diagnosis not present

## 2018-11-15 DIAGNOSIS — I69321 Dysphasia following cerebral infarction: Secondary | ICD-10-CM | POA: Diagnosis not present

## 2018-11-19 DIAGNOSIS — I69321 Dysphasia following cerebral infarction: Secondary | ICD-10-CM | POA: Diagnosis not present

## 2018-11-19 DIAGNOSIS — Z4800 Encounter for change or removal of nonsurgical wound dressing: Secondary | ICD-10-CM | POA: Diagnosis not present

## 2018-11-19 DIAGNOSIS — F0280 Dementia in other diseases classified elsewhere without behavioral disturbance: Secondary | ICD-10-CM | POA: Diagnosis not present

## 2018-11-19 DIAGNOSIS — G301 Alzheimer's disease with late onset: Secondary | ICD-10-CM | POA: Diagnosis not present

## 2018-11-19 DIAGNOSIS — Z7902 Long term (current) use of antithrombotics/antiplatelets: Secondary | ICD-10-CM | POA: Diagnosis not present

## 2018-11-19 DIAGNOSIS — E1165 Type 2 diabetes mellitus with hyperglycemia: Secondary | ICD-10-CM | POA: Diagnosis not present

## 2018-11-19 DIAGNOSIS — L89122 Pressure ulcer of left upper back, stage 2: Secondary | ICD-10-CM | POA: Diagnosis not present

## 2018-11-19 DIAGNOSIS — L89152 Pressure ulcer of sacral region, stage 2: Secondary | ICD-10-CM | POA: Diagnosis not present

## 2018-11-19 DIAGNOSIS — Z7401 Bed confinement status: Secondary | ICD-10-CM | POA: Diagnosis not present

## 2018-11-21 DIAGNOSIS — F0280 Dementia in other diseases classified elsewhere without behavioral disturbance: Secondary | ICD-10-CM | POA: Diagnosis not present

## 2018-11-21 DIAGNOSIS — Z4800 Encounter for change or removal of nonsurgical wound dressing: Secondary | ICD-10-CM | POA: Diagnosis not present

## 2018-11-21 DIAGNOSIS — E1165 Type 2 diabetes mellitus with hyperglycemia: Secondary | ICD-10-CM | POA: Diagnosis not present

## 2018-11-21 DIAGNOSIS — I69398 Other sequelae of cerebral infarction: Secondary | ICD-10-CM | POA: Diagnosis not present

## 2018-11-21 DIAGNOSIS — M6281 Muscle weakness (generalized): Secondary | ICD-10-CM | POA: Diagnosis not present

## 2018-11-21 DIAGNOSIS — Z7401 Bed confinement status: Secondary | ICD-10-CM | POA: Diagnosis not present

## 2018-11-21 DIAGNOSIS — Z7902 Long term (current) use of antithrombotics/antiplatelets: Secondary | ICD-10-CM | POA: Diagnosis not present

## 2018-11-21 DIAGNOSIS — G301 Alzheimer's disease with late onset: Secondary | ICD-10-CM | POA: Diagnosis not present

## 2018-11-21 DIAGNOSIS — I69321 Dysphasia following cerebral infarction: Secondary | ICD-10-CM | POA: Diagnosis not present

## 2018-11-26 DIAGNOSIS — I69321 Dysphasia following cerebral infarction: Secondary | ICD-10-CM | POA: Diagnosis not present

## 2018-11-26 DIAGNOSIS — M6281 Muscle weakness (generalized): Secondary | ICD-10-CM | POA: Diagnosis not present

## 2018-11-26 DIAGNOSIS — I69398 Other sequelae of cerebral infarction: Secondary | ICD-10-CM | POA: Diagnosis not present

## 2018-11-26 DIAGNOSIS — G301 Alzheimer's disease with late onset: Secondary | ICD-10-CM | POA: Diagnosis not present

## 2018-11-26 DIAGNOSIS — Z4800 Encounter for change or removal of nonsurgical wound dressing: Secondary | ICD-10-CM | POA: Diagnosis not present

## 2018-11-26 DIAGNOSIS — Z7902 Long term (current) use of antithrombotics/antiplatelets: Secondary | ICD-10-CM | POA: Diagnosis not present

## 2018-11-26 DIAGNOSIS — Z7401 Bed confinement status: Secondary | ICD-10-CM | POA: Diagnosis not present

## 2018-11-26 DIAGNOSIS — E1165 Type 2 diabetes mellitus with hyperglycemia: Secondary | ICD-10-CM | POA: Diagnosis not present

## 2018-11-26 DIAGNOSIS — F0280 Dementia in other diseases classified elsewhere without behavioral disturbance: Secondary | ICD-10-CM | POA: Diagnosis not present

## 2018-11-28 DIAGNOSIS — Z7401 Bed confinement status: Secondary | ICD-10-CM | POA: Diagnosis not present

## 2018-11-28 DIAGNOSIS — Z4800 Encounter for change or removal of nonsurgical wound dressing: Secondary | ICD-10-CM | POA: Diagnosis not present

## 2018-11-28 DIAGNOSIS — M6281 Muscle weakness (generalized): Secondary | ICD-10-CM | POA: Diagnosis not present

## 2018-11-28 DIAGNOSIS — F0280 Dementia in other diseases classified elsewhere without behavioral disturbance: Secondary | ICD-10-CM | POA: Diagnosis not present

## 2018-11-28 DIAGNOSIS — I69398 Other sequelae of cerebral infarction: Secondary | ICD-10-CM | POA: Diagnosis not present

## 2018-11-28 DIAGNOSIS — G301 Alzheimer's disease with late onset: Secondary | ICD-10-CM | POA: Diagnosis not present

## 2018-11-28 DIAGNOSIS — I69321 Dysphasia following cerebral infarction: Secondary | ICD-10-CM | POA: Diagnosis not present

## 2018-11-28 DIAGNOSIS — E1165 Type 2 diabetes mellitus with hyperglycemia: Secondary | ICD-10-CM | POA: Diagnosis not present

## 2018-11-28 DIAGNOSIS — Z7902 Long term (current) use of antithrombotics/antiplatelets: Secondary | ICD-10-CM | POA: Diagnosis not present

## 2018-12-03 DIAGNOSIS — E1165 Type 2 diabetes mellitus with hyperglycemia: Secondary | ICD-10-CM | POA: Diagnosis not present

## 2018-12-03 DIAGNOSIS — Z7401 Bed confinement status: Secondary | ICD-10-CM | POA: Diagnosis not present

## 2018-12-03 DIAGNOSIS — Z7902 Long term (current) use of antithrombotics/antiplatelets: Secondary | ICD-10-CM | POA: Diagnosis not present

## 2018-12-03 DIAGNOSIS — F0280 Dementia in other diseases classified elsewhere without behavioral disturbance: Secondary | ICD-10-CM | POA: Diagnosis not present

## 2018-12-03 DIAGNOSIS — I69321 Dysphasia following cerebral infarction: Secondary | ICD-10-CM | POA: Diagnosis not present

## 2018-12-03 DIAGNOSIS — Z4800 Encounter for change or removal of nonsurgical wound dressing: Secondary | ICD-10-CM | POA: Diagnosis not present

## 2018-12-03 DIAGNOSIS — M6281 Muscle weakness (generalized): Secondary | ICD-10-CM | POA: Diagnosis not present

## 2018-12-03 DIAGNOSIS — I69398 Other sequelae of cerebral infarction: Secondary | ICD-10-CM | POA: Diagnosis not present

## 2018-12-03 DIAGNOSIS — G301 Alzheimer's disease with late onset: Secondary | ICD-10-CM | POA: Diagnosis not present

## 2018-12-05 DIAGNOSIS — E1165 Type 2 diabetes mellitus with hyperglycemia: Secondary | ICD-10-CM | POA: Diagnosis not present

## 2018-12-05 DIAGNOSIS — N390 Urinary tract infection, site not specified: Secondary | ICD-10-CM | POA: Diagnosis not present

## 2018-12-05 DIAGNOSIS — M6281 Muscle weakness (generalized): Secondary | ICD-10-CM | POA: Diagnosis not present

## 2018-12-05 DIAGNOSIS — Z4800 Encounter for change or removal of nonsurgical wound dressing: Secondary | ICD-10-CM | POA: Diagnosis not present

## 2018-12-05 DIAGNOSIS — Z7902 Long term (current) use of antithrombotics/antiplatelets: Secondary | ICD-10-CM | POA: Diagnosis not present

## 2018-12-05 DIAGNOSIS — Z87440 Personal history of urinary (tract) infections: Secondary | ICD-10-CM | POA: Diagnosis not present

## 2018-12-05 DIAGNOSIS — Z7401 Bed confinement status: Secondary | ICD-10-CM | POA: Diagnosis not present

## 2018-12-05 DIAGNOSIS — G301 Alzheimer's disease with late onset: Secondary | ICD-10-CM | POA: Diagnosis not present

## 2018-12-05 DIAGNOSIS — I69398 Other sequelae of cerebral infarction: Secondary | ICD-10-CM | POA: Diagnosis not present

## 2018-12-05 DIAGNOSIS — I69321 Dysphasia following cerebral infarction: Secondary | ICD-10-CM | POA: Diagnosis not present

## 2018-12-05 DIAGNOSIS — F0280 Dementia in other diseases classified elsewhere without behavioral disturbance: Secondary | ICD-10-CM | POA: Diagnosis not present

## 2018-12-06 DIAGNOSIS — I69321 Dysphasia following cerebral infarction: Secondary | ICD-10-CM | POA: Diagnosis not present

## 2018-12-06 DIAGNOSIS — E1165 Type 2 diabetes mellitus with hyperglycemia: Secondary | ICD-10-CM | POA: Diagnosis not present

## 2018-12-06 DIAGNOSIS — F0280 Dementia in other diseases classified elsewhere without behavioral disturbance: Secondary | ICD-10-CM | POA: Diagnosis not present

## 2018-12-06 DIAGNOSIS — Z4800 Encounter for change or removal of nonsurgical wound dressing: Secondary | ICD-10-CM | POA: Diagnosis not present

## 2018-12-06 DIAGNOSIS — M6281 Muscle weakness (generalized): Secondary | ICD-10-CM | POA: Diagnosis not present

## 2018-12-06 DIAGNOSIS — Z7401 Bed confinement status: Secondary | ICD-10-CM | POA: Diagnosis not present

## 2018-12-06 DIAGNOSIS — I69398 Other sequelae of cerebral infarction: Secondary | ICD-10-CM | POA: Diagnosis not present

## 2018-12-06 DIAGNOSIS — G301 Alzheimer's disease with late onset: Secondary | ICD-10-CM | POA: Diagnosis not present

## 2018-12-06 DIAGNOSIS — Z7902 Long term (current) use of antithrombotics/antiplatelets: Secondary | ICD-10-CM | POA: Diagnosis not present

## 2018-12-10 DIAGNOSIS — Z7902 Long term (current) use of antithrombotics/antiplatelets: Secondary | ICD-10-CM | POA: Diagnosis not present

## 2018-12-10 DIAGNOSIS — F0280 Dementia in other diseases classified elsewhere without behavioral disturbance: Secondary | ICD-10-CM | POA: Diagnosis not present

## 2018-12-10 DIAGNOSIS — Z4800 Encounter for change or removal of nonsurgical wound dressing: Secondary | ICD-10-CM | POA: Diagnosis not present

## 2018-12-10 DIAGNOSIS — G301 Alzheimer's disease with late onset: Secondary | ICD-10-CM | POA: Diagnosis not present

## 2018-12-10 DIAGNOSIS — E1165 Type 2 diabetes mellitus with hyperglycemia: Secondary | ICD-10-CM | POA: Diagnosis not present

## 2018-12-10 DIAGNOSIS — I69321 Dysphasia following cerebral infarction: Secondary | ICD-10-CM | POA: Diagnosis not present

## 2018-12-10 DIAGNOSIS — I69398 Other sequelae of cerebral infarction: Secondary | ICD-10-CM | POA: Diagnosis not present

## 2018-12-10 DIAGNOSIS — Z7401 Bed confinement status: Secondary | ICD-10-CM | POA: Diagnosis not present

## 2018-12-10 DIAGNOSIS — M6281 Muscle weakness (generalized): Secondary | ICD-10-CM | POA: Diagnosis not present

## 2018-12-13 DIAGNOSIS — G301 Alzheimer's disease with late onset: Secondary | ICD-10-CM | POA: Diagnosis not present

## 2018-12-13 DIAGNOSIS — Z7401 Bed confinement status: Secondary | ICD-10-CM | POA: Diagnosis not present

## 2018-12-13 DIAGNOSIS — Z7902 Long term (current) use of antithrombotics/antiplatelets: Secondary | ICD-10-CM | POA: Diagnosis not present

## 2018-12-13 DIAGNOSIS — M6281 Muscle weakness (generalized): Secondary | ICD-10-CM | POA: Diagnosis not present

## 2018-12-13 DIAGNOSIS — I69321 Dysphasia following cerebral infarction: Secondary | ICD-10-CM | POA: Diagnosis not present

## 2018-12-13 DIAGNOSIS — I69398 Other sequelae of cerebral infarction: Secondary | ICD-10-CM | POA: Diagnosis not present

## 2018-12-13 DIAGNOSIS — F0280 Dementia in other diseases classified elsewhere without behavioral disturbance: Secondary | ICD-10-CM | POA: Diagnosis not present

## 2018-12-13 DIAGNOSIS — E1165 Type 2 diabetes mellitus with hyperglycemia: Secondary | ICD-10-CM | POA: Diagnosis not present

## 2018-12-13 DIAGNOSIS — Z4800 Encounter for change or removal of nonsurgical wound dressing: Secondary | ICD-10-CM | POA: Diagnosis not present

## 2018-12-17 DIAGNOSIS — E1165 Type 2 diabetes mellitus with hyperglycemia: Secondary | ICD-10-CM | POA: Diagnosis not present

## 2018-12-17 DIAGNOSIS — F0280 Dementia in other diseases classified elsewhere without behavioral disturbance: Secondary | ICD-10-CM | POA: Diagnosis not present

## 2018-12-17 DIAGNOSIS — Z7401 Bed confinement status: Secondary | ICD-10-CM | POA: Diagnosis not present

## 2018-12-17 DIAGNOSIS — I69398 Other sequelae of cerebral infarction: Secondary | ICD-10-CM | POA: Diagnosis not present

## 2018-12-17 DIAGNOSIS — I69321 Dysphasia following cerebral infarction: Secondary | ICD-10-CM | POA: Diagnosis not present

## 2018-12-17 DIAGNOSIS — G301 Alzheimer's disease with late onset: Secondary | ICD-10-CM | POA: Diagnosis not present

## 2018-12-17 DIAGNOSIS — M6281 Muscle weakness (generalized): Secondary | ICD-10-CM | POA: Diagnosis not present

## 2018-12-17 DIAGNOSIS — Z7902 Long term (current) use of antithrombotics/antiplatelets: Secondary | ICD-10-CM | POA: Diagnosis not present

## 2018-12-17 DIAGNOSIS — Z4800 Encounter for change or removal of nonsurgical wound dressing: Secondary | ICD-10-CM | POA: Diagnosis not present

## 2018-12-21 DIAGNOSIS — G301 Alzheimer's disease with late onset: Secondary | ICD-10-CM | POA: Diagnosis not present

## 2018-12-21 DIAGNOSIS — R5381 Other malaise: Secondary | ICD-10-CM | POA: Diagnosis not present

## 2018-12-25 DIAGNOSIS — G301 Alzheimer's disease with late onset: Secondary | ICD-10-CM | POA: Diagnosis not present

## 2018-12-25 DIAGNOSIS — F0280 Dementia in other diseases classified elsewhere without behavioral disturbance: Secondary | ICD-10-CM | POA: Diagnosis not present

## 2018-12-25 DIAGNOSIS — M6281 Muscle weakness (generalized): Secondary | ICD-10-CM | POA: Diagnosis not present

## 2018-12-25 DIAGNOSIS — Z4800 Encounter for change or removal of nonsurgical wound dressing: Secondary | ICD-10-CM | POA: Diagnosis not present

## 2018-12-25 DIAGNOSIS — E1165 Type 2 diabetes mellitus with hyperglycemia: Secondary | ICD-10-CM | POA: Diagnosis not present

## 2018-12-25 DIAGNOSIS — Z7902 Long term (current) use of antithrombotics/antiplatelets: Secondary | ICD-10-CM | POA: Diagnosis not present

## 2018-12-25 DIAGNOSIS — I69321 Dysphasia following cerebral infarction: Secondary | ICD-10-CM | POA: Diagnosis not present

## 2018-12-25 DIAGNOSIS — I69398 Other sequelae of cerebral infarction: Secondary | ICD-10-CM | POA: Diagnosis not present

## 2018-12-27 DIAGNOSIS — M6281 Muscle weakness (generalized): Secondary | ICD-10-CM | POA: Diagnosis not present

## 2018-12-27 DIAGNOSIS — Z7902 Long term (current) use of antithrombotics/antiplatelets: Secondary | ICD-10-CM | POA: Diagnosis not present

## 2018-12-27 DIAGNOSIS — Z4800 Encounter for change or removal of nonsurgical wound dressing: Secondary | ICD-10-CM | POA: Diagnosis not present

## 2018-12-27 DIAGNOSIS — Z7401 Bed confinement status: Secondary | ICD-10-CM | POA: Diagnosis not present

## 2018-12-27 DIAGNOSIS — I69398 Other sequelae of cerebral infarction: Secondary | ICD-10-CM | POA: Diagnosis not present

## 2018-12-27 DIAGNOSIS — I69321 Dysphasia following cerebral infarction: Secondary | ICD-10-CM | POA: Diagnosis not present

## 2018-12-27 DIAGNOSIS — G301 Alzheimer's disease with late onset: Secondary | ICD-10-CM | POA: Diagnosis not present

## 2018-12-27 DIAGNOSIS — F0280 Dementia in other diseases classified elsewhere without behavioral disturbance: Secondary | ICD-10-CM | POA: Diagnosis not present

## 2018-12-27 DIAGNOSIS — E1165 Type 2 diabetes mellitus with hyperglycemia: Secondary | ICD-10-CM | POA: Diagnosis not present

## 2019-01-19 DIAGNOSIS — G301 Alzheimer's disease with late onset: Secondary | ICD-10-CM | POA: Diagnosis not present

## 2019-01-21 DIAGNOSIS — G301 Alzheimer's disease with late onset: Secondary | ICD-10-CM | POA: Diagnosis not present

## 2019-01-21 DIAGNOSIS — R5381 Other malaise: Secondary | ICD-10-CM | POA: Diagnosis not present

## 2019-02-19 DIAGNOSIS — G301 Alzheimer's disease with late onset: Secondary | ICD-10-CM | POA: Diagnosis not present

## 2019-02-20 DIAGNOSIS — R5381 Other malaise: Secondary | ICD-10-CM | POA: Diagnosis not present

## 2019-02-20 DIAGNOSIS — G301 Alzheimer's disease with late onset: Secondary | ICD-10-CM | POA: Diagnosis not present

## 2019-03-23 DIAGNOSIS — G301 Alzheimer's disease with late onset: Secondary | ICD-10-CM | POA: Diagnosis not present

## 2019-03-23 DIAGNOSIS — R5381 Other malaise: Secondary | ICD-10-CM | POA: Diagnosis not present

## 2019-04-23 DIAGNOSIS — R5381 Other malaise: Secondary | ICD-10-CM | POA: Diagnosis not present

## 2019-04-23 DIAGNOSIS — G301 Alzheimer's disease with late onset: Secondary | ICD-10-CM | POA: Diagnosis not present

## 2019-04-25 DIAGNOSIS — N3090 Cystitis, unspecified without hematuria: Secondary | ICD-10-CM | POA: Diagnosis not present

## 2019-04-25 DIAGNOSIS — R300 Dysuria: Secondary | ICD-10-CM | POA: Diagnosis not present

## 2019-05-22 DIAGNOSIS — E782 Mixed hyperlipidemia: Secondary | ICD-10-CM | POA: Diagnosis not present

## 2019-05-22 DIAGNOSIS — I10 Essential (primary) hypertension: Secondary | ICD-10-CM | POA: Diagnosis not present

## 2019-05-23 DIAGNOSIS — G301 Alzheimer's disease with late onset: Secondary | ICD-10-CM | POA: Diagnosis not present

## 2019-05-23 DIAGNOSIS — R5381 Other malaise: Secondary | ICD-10-CM | POA: Diagnosis not present

## 2019-05-24 DIAGNOSIS — Z23 Encounter for immunization: Secondary | ICD-10-CM | POA: Diagnosis not present

## 2019-06-18 DIAGNOSIS — N3090 Cystitis, unspecified without hematuria: Secondary | ICD-10-CM | POA: Diagnosis not present

## 2019-06-21 DIAGNOSIS — G301 Alzheimer's disease with late onset: Secondary | ICD-10-CM | POA: Diagnosis not present

## 2019-06-21 DIAGNOSIS — G459 Transient cerebral ischemic attack, unspecified: Secondary | ICD-10-CM | POA: Diagnosis not present

## 2019-06-23 DIAGNOSIS — R5381 Other malaise: Secondary | ICD-10-CM | POA: Diagnosis not present

## 2019-06-23 DIAGNOSIS — G301 Alzheimer's disease with late onset: Secondary | ICD-10-CM | POA: Diagnosis not present

## 2019-07-19 DIAGNOSIS — I10 Essential (primary) hypertension: Secondary | ICD-10-CM | POA: Diagnosis not present

## 2019-07-22 DIAGNOSIS — I10 Essential (primary) hypertension: Secondary | ICD-10-CM | POA: Diagnosis not present

## 2019-07-22 DIAGNOSIS — G301 Alzheimer's disease with late onset: Secondary | ICD-10-CM | POA: Diagnosis not present

## 2019-07-23 DIAGNOSIS — R5381 Other malaise: Secondary | ICD-10-CM | POA: Diagnosis not present

## 2019-07-23 DIAGNOSIS — G301 Alzheimer's disease with late onset: Secondary | ICD-10-CM | POA: Diagnosis not present

## 2019-08-04 DIAGNOSIS — F0280 Dementia in other diseases classified elsewhere without behavioral disturbance: Secondary | ICD-10-CM | POA: Diagnosis not present

## 2019-08-04 DIAGNOSIS — G4700 Insomnia, unspecified: Secondary | ICD-10-CM | POA: Diagnosis not present

## 2019-08-04 DIAGNOSIS — Z8673 Personal history of transient ischemic attack (TIA), and cerebral infarction without residual deficits: Secondary | ICD-10-CM | POA: Diagnosis not present

## 2019-08-04 DIAGNOSIS — Z87891 Personal history of nicotine dependence: Secondary | ICD-10-CM | POA: Diagnosis not present

## 2019-08-04 DIAGNOSIS — K219 Gastro-esophageal reflux disease without esophagitis: Secondary | ICD-10-CM | POA: Diagnosis not present

## 2019-08-04 DIAGNOSIS — Z90710 Acquired absence of both cervix and uterus: Secondary | ICD-10-CM | POA: Diagnosis not present

## 2019-08-04 DIAGNOSIS — G309 Alzheimer's disease, unspecified: Secondary | ICD-10-CM | POA: Diagnosis not present

## 2019-08-04 DIAGNOSIS — I10 Essential (primary) hypertension: Secondary | ICD-10-CM | POA: Diagnosis not present

## 2019-08-22 DIAGNOSIS — I10 Essential (primary) hypertension: Secondary | ICD-10-CM | POA: Diagnosis not present

## 2019-08-22 DIAGNOSIS — G301 Alzheimer's disease with late onset: Secondary | ICD-10-CM | POA: Diagnosis not present

## 2019-08-23 DIAGNOSIS — G301 Alzheimer's disease with late onset: Secondary | ICD-10-CM | POA: Diagnosis not present

## 2019-08-23 DIAGNOSIS — R5381 Other malaise: Secondary | ICD-10-CM | POA: Diagnosis not present

## 2019-08-28 DIAGNOSIS — N3090 Cystitis, unspecified without hematuria: Secondary | ICD-10-CM | POA: Diagnosis not present

## 2019-09-20 DIAGNOSIS — G301 Alzheimer's disease with late onset: Secondary | ICD-10-CM | POA: Diagnosis not present

## 2019-09-20 DIAGNOSIS — I10 Essential (primary) hypertension: Secondary | ICD-10-CM | POA: Diagnosis not present

## 2019-09-21 DIAGNOSIS — G301 Alzheimer's disease with late onset: Secondary | ICD-10-CM | POA: Diagnosis not present

## 2019-09-23 DIAGNOSIS — G301 Alzheimer's disease with late onset: Secondary | ICD-10-CM | POA: Diagnosis not present

## 2019-09-23 DIAGNOSIS — R5381 Other malaise: Secondary | ICD-10-CM | POA: Diagnosis not present

## 2019-10-16 DIAGNOSIS — E7849 Other hyperlipidemia: Secondary | ICD-10-CM | POA: Diagnosis not present

## 2019-10-16 DIAGNOSIS — I10 Essential (primary) hypertension: Secondary | ICD-10-CM | POA: Diagnosis not present

## 2019-10-17 DIAGNOSIS — M1712 Unilateral primary osteoarthritis, left knee: Secondary | ICD-10-CM | POA: Diagnosis not present

## 2019-10-18 DIAGNOSIS — E7849 Other hyperlipidemia: Secondary | ICD-10-CM | POA: Diagnosis not present

## 2019-10-18 DIAGNOSIS — I10 Essential (primary) hypertension: Secondary | ICD-10-CM | POA: Diagnosis not present

## 2019-10-18 DIAGNOSIS — G301 Alzheimer's disease with late onset: Secondary | ICD-10-CM | POA: Diagnosis not present

## 2019-10-21 DIAGNOSIS — R5381 Other malaise: Secondary | ICD-10-CM | POA: Diagnosis not present

## 2019-10-21 DIAGNOSIS — G301 Alzheimer's disease with late onset: Secondary | ICD-10-CM | POA: Diagnosis not present

## 2019-11-15 DIAGNOSIS — G301 Alzheimer's disease with late onset: Secondary | ICD-10-CM | POA: Diagnosis not present

## 2019-11-19 DIAGNOSIS — G301 Alzheimer's disease with late onset: Secondary | ICD-10-CM | POA: Diagnosis not present

## 2019-11-19 DIAGNOSIS — I10 Essential (primary) hypertension: Secondary | ICD-10-CM | POA: Diagnosis not present

## 2019-11-20 DIAGNOSIS — G301 Alzheimer's disease with late onset: Secondary | ICD-10-CM | POA: Diagnosis not present

## 2019-11-20 DIAGNOSIS — I10 Essential (primary) hypertension: Secondary | ICD-10-CM | POA: Diagnosis not present

## 2019-11-21 DIAGNOSIS — G301 Alzheimer's disease with late onset: Secondary | ICD-10-CM | POA: Diagnosis not present

## 2019-11-21 DIAGNOSIS — R5381 Other malaise: Secondary | ICD-10-CM | POA: Diagnosis not present

## 2019-12-16 DIAGNOSIS — G301 Alzheimer's disease with late onset: Secondary | ICD-10-CM | POA: Diagnosis not present

## 2019-12-18 DIAGNOSIS — N3090 Cystitis, unspecified without hematuria: Secondary | ICD-10-CM | POA: Diagnosis not present

## 2019-12-19 DIAGNOSIS — I10 Essential (primary) hypertension: Secondary | ICD-10-CM | POA: Diagnosis not present

## 2019-12-19 DIAGNOSIS — G301 Alzheimer's disease with late onset: Secondary | ICD-10-CM | POA: Diagnosis not present

## 2019-12-20 DIAGNOSIS — I10 Essential (primary) hypertension: Secondary | ICD-10-CM | POA: Diagnosis not present

## 2019-12-20 DIAGNOSIS — G301 Alzheimer's disease with late onset: Secondary | ICD-10-CM | POA: Diagnosis not present

## 2019-12-21 DIAGNOSIS — R5381 Other malaise: Secondary | ICD-10-CM | POA: Diagnosis not present

## 2019-12-21 DIAGNOSIS — G301 Alzheimer's disease with late onset: Secondary | ICD-10-CM | POA: Diagnosis not present

## 2020-01-15 DIAGNOSIS — G301 Alzheimer's disease with late onset: Secondary | ICD-10-CM | POA: Diagnosis not present

## 2020-01-20 DIAGNOSIS — I10 Essential (primary) hypertension: Secondary | ICD-10-CM | POA: Diagnosis not present

## 2020-01-20 DIAGNOSIS — E1165 Type 2 diabetes mellitus with hyperglycemia: Secondary | ICD-10-CM | POA: Diagnosis not present

## 2020-01-20 DIAGNOSIS — F0280 Dementia in other diseases classified elsewhere without behavioral disturbance: Secondary | ICD-10-CM | POA: Diagnosis not present

## 2020-01-20 DIAGNOSIS — M1712 Unilateral primary osteoarthritis, left knee: Secondary | ICD-10-CM | POA: Diagnosis not present

## 2020-01-20 DIAGNOSIS — G301 Alzheimer's disease with late onset: Secondary | ICD-10-CM | POA: Diagnosis not present

## 2020-02-15 DIAGNOSIS — G301 Alzheimer's disease with late onset: Secondary | ICD-10-CM | POA: Diagnosis not present

## 2020-03-16 DIAGNOSIS — G301 Alzheimer's disease with late onset: Secondary | ICD-10-CM | POA: Diagnosis not present

## 2020-04-16 DIAGNOSIS — G301 Alzheimer's disease with late onset: Secondary | ICD-10-CM | POA: Diagnosis not present

## 2020-08-16 DIAGNOSIS — G301 Alzheimer's disease with late onset: Secondary | ICD-10-CM | POA: Diagnosis not present

## 2020-09-22 DEATH — deceased
